# Patient Record
Sex: Female | Born: 1940 | Race: White | Hispanic: No | Smoking: Current every day smoker
Health system: Southern US, Community
[De-identification: ages and names within clinical notes are randomized; demographics above are authoritative.]

## PROBLEM LIST (undated history)

## (undated) DIAGNOSIS — J449 Chronic obstructive pulmonary disease, unspecified: Secondary | ICD-10-CM

## (undated) DIAGNOSIS — F418 Other specified anxiety disorders: Secondary | ICD-10-CM

## (undated) DIAGNOSIS — I10 Essential (primary) hypertension: Secondary | ICD-10-CM

## (undated) DIAGNOSIS — E039 Hypothyroidism, unspecified: Secondary | ICD-10-CM

## (undated) HISTORY — DX: Other specified anxiety disorders: F41.8

## (undated) HISTORY — PX: EYE SURGERY: SHX253

## (undated) HISTORY — PX: ABDOMINAL HYSTERECTOMY: SHX81

## (undated) HISTORY — PX: CHOLECYSTECTOMY: SHX55

## (undated) HISTORY — PX: TUMOR REMOVAL: SHX12

---

## 1998-08-10 ENCOUNTER — Encounter: Payer: Self-pay | Admitting: Neurosurgery

## 1998-08-10 ENCOUNTER — Ambulatory Visit (HOSPITAL_COMMUNITY): Admission: RE | Admit: 1998-08-10 | Discharge: 1998-08-10 | Payer: Self-pay | Admitting: Neurosurgery

## 1998-08-27 ENCOUNTER — Ambulatory Visit (HOSPITAL_COMMUNITY): Admission: RE | Admit: 1998-08-27 | Discharge: 1998-08-27 | Payer: Self-pay | Admitting: Neurosurgery

## 1998-09-22 ENCOUNTER — Encounter: Payer: Self-pay | Admitting: Neurosurgery

## 1998-09-24 ENCOUNTER — Ambulatory Visit (HOSPITAL_COMMUNITY): Admission: RE | Admit: 1998-09-24 | Discharge: 1998-09-24 | Payer: Self-pay | Admitting: Neurosurgery

## 2003-11-18 ENCOUNTER — Encounter: Admission: RE | Admit: 2003-11-18 | Discharge: 2003-11-18 | Payer: Self-pay | Admitting: Otolaryngology

## 2003-11-19 ENCOUNTER — Encounter (INDEPENDENT_AMBULATORY_CARE_PROVIDER_SITE_OTHER): Payer: Self-pay | Admitting: Specialist

## 2003-11-19 ENCOUNTER — Ambulatory Visit (HOSPITAL_BASED_OUTPATIENT_CLINIC_OR_DEPARTMENT_OTHER): Admission: RE | Admit: 2003-11-19 | Discharge: 2003-11-19 | Payer: Self-pay | Admitting: Otolaryngology

## 2003-11-19 ENCOUNTER — Ambulatory Visit (HOSPITAL_COMMUNITY): Admission: RE | Admit: 2003-11-19 | Discharge: 2003-11-19 | Payer: Self-pay | Admitting: Otolaryngology

## 2005-03-23 ENCOUNTER — Ambulatory Visit: Payer: Self-pay | Admitting: Cardiology

## 2006-03-02 ENCOUNTER — Ambulatory Visit: Payer: Self-pay | Admitting: Family Medicine

## 2006-03-26 ENCOUNTER — Ambulatory Visit: Payer: Self-pay | Admitting: Family Medicine

## 2006-05-07 ENCOUNTER — Ambulatory Visit: Payer: Self-pay | Admitting: Family Medicine

## 2006-07-05 ENCOUNTER — Ambulatory Visit: Payer: Self-pay | Admitting: Family Medicine

## 2010-06-06 ENCOUNTER — Encounter (HOSPITAL_COMMUNITY)
Admission: RE | Admit: 2010-06-06 | Discharge: 2010-06-06 | Disposition: A | Discharge status: Home / Self Care | Payer: Medicare Other | Source: Ambulatory Visit | Attending: Obstetrics and Gynecology | Admitting: Obstetrics and Gynecology

## 2010-06-06 LAB — BASIC METABOLIC PANEL
BUN: 18 mg/dL (ref 6–23)
CO2: 25 mEq/L (ref 19–32)
Calcium: 9.4 mg/dL (ref 8.4–10.5)
Chloride: 101 mEq/L (ref 96–112)
Creatinine, Ser: 0.82 mg/dL (ref 0.4–1.2)
GFR calc Af Amer: >60 mL/min (ref >60)
GFR calc non Af Amer: >60 mL/min (ref >60)
Glucose, Bld: 90 mg/dL (ref 70–99)
Potassium: 3.6 mEq/L (ref 3.5–5.1)
Sodium: 134 mEq/L — ABNORMAL LOW (ref 135–145)

## 2010-06-06 LAB — CBC
HCT: 43.8 % (ref 36.0–46.0)
Hemoglobin: 15.1 g/dL — ABNORMAL HIGH (ref 12.0–15.0)
MCH: 32.5 pg (ref 26.0–34.0)
MCHC: 34.5 g/dL (ref 30.0–36.0)
MCV: 94.4 fL (ref 78.0–100.0)
Platelets: 265 10*3/uL (ref 150–400)
RBC: 4.64 MIL/uL (ref 3.87–5.11)
RDW: 13.3 % (ref 11.5–15.5)
WBC: 11.9 10*3/uL — ABNORMAL HIGH (ref 4.0–10.5)

## 2010-06-06 LAB — SURGICAL PCR SCREEN
MRSA, PCR: NEGATIVE
Staphylococcus aureus: NEGATIVE

## 2010-06-13 ENCOUNTER — Ambulatory Visit (HOSPITAL_COMMUNITY)
Admission: RE | Admit: 2010-06-13 | Discharge: 2010-06-15 | Disposition: A | Discharge status: Home / Self Care | Payer: Medicare Other | Source: Ambulatory Visit | Attending: Obstetrics and Gynecology | Admitting: Obstetrics and Gynecology

## 2010-06-13 DIAGNOSIS — N393 Stress incontinence (female) (male): Secondary | ICD-10-CM | POA: Insufficient documentation

## 2010-06-13 DIAGNOSIS — N993 Prolapse of vaginal vault after hysterectomy: Secondary | ICD-10-CM | POA: Insufficient documentation

## 2010-06-14 LAB — CBC
Hemoglobin: 11.5 g/dL — ABNORMAL LOW (ref 12.0–15.0)
MCH: 31.3 pg (ref 26.0–34.0)
Platelets: 211 10*3/uL (ref 150–400)
RBC: 3.68 MIL/uL — ABNORMAL LOW (ref 3.87–5.11)
WBC: 16.6 10*3/uL — ABNORMAL HIGH (ref 4.0–10.5)

## 2010-07-06 NOTE — Discharge Summary (Signed)
  NAMEANGLIA, Maria Blackwell              ACCOUNT NO.:  [id number]  MEDICAL RECORD NO.:  [id number]           PATIENT TYPE:  O  LOCATION:  9316                          FACILITY:  [facility]  PHYSICIAN:  Juluis Mire, M.D.   DATE OF BIRTH:  August 29, 1940  DATE OF ADMISSION:  06/13/2010 DATE OF DISCHARGE:  06/15/2010                              DISCHARGE SUMMARY   ADMITTING DIAGNOSIS:  Pelvic relaxation with associated stress incontinence.  DISCHARGE DIAGNOSIS:  Pelvic relaxation with associated stress incontinence.  OPERATIVE PROCEDURE:  Posterior repair with sacrospinous ligament suspension.  Mid urethral sling and cystoscopy.  For complete history and physical please see dictated note.  COURSE IN THE HOSPITAL:  The patient underwent the above-noted surgery. Postop, did well.  The following morning, her Foley was discontinued. She was able to void.  Residuals gradually decreased and the last one was less than 100 mL.  We kept her overnight for the second night to make sure she was voiding without difficulty plus she had a little bit of nausea which did resolve by the following morning.  She did complain of some right buttocks pain is only with movement.  It was stable at the present time.  At the time of discharge, she was afebrile, stable vital signs.  Abdomen was soft.  Bowel sounds were active.  She was passing flatus, had no active vaginal bleeding, was voiding well.  Her postop hemoglobin was 11.5.  In terms of complications, none were encountered during her stay in the hospital.  Discharged home in stable condition.  DISPOSITION:  The patient to avoid heavy lifting, vaginal entrance, driving a car.  She is to call with active bleeding, increasing pain, nausea, vomiting or fever.  She is also instructed of signs and symptoms of deep venous thrombosis and pulmonary embolus.  She will come to the office tomorrow for me to measure one more postvoid residual.     Juluis Mire,  M.D.     JSM/MEDQ  D:  06/15/2010  T:  06/15/2010  Job:  [id number]  Electronically Signed by Richardean Chimera M.D. on 07/06/2010 05:38:48 AM

## 2010-07-06 NOTE — H&P (Signed)
NAMEEYVETTE, CORDON              ACCOUNT NO.:  [id number]  MEDICAL RECORD NO.:  [id number]           PATIENT TYPE:  O  LOCATION:  9316                          FACILITY:  [facility]  PHYSICIAN:  Juluis Mire, M.D.   DATE OF BIRTH:  August 31, 1940  DATE OF ADMISSION:  06/13/2010 DATE OF DISCHARGE:                             HISTORY & PHYSICAL   The patient is a 70 year old, gravida 3, para 3, postmenopausal patient who presents for anterior and posterior repair, mid urethral sling, and sacrospinous ligament suspension.  In relation to present admission, the patient has had a previous total abdominal hysterectomy with bilateral salpingo-oophorectomy.  She has trouble with worsening pelvic relaxation with associated stress incontinence.  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  She had fairly normal leak point pressures.  Also had a normal urethral pressure profile.  She has trouble with worsening pelvic relaxation.  She does have a significant rectocele.  Anteriorly, she has a minimal cystourethrocele.  Cuff is intact.  She now presents for anterior and posterior repair, mid urethral sling, and sacrospinous ligament suspension.  In terms of allergies, she is allergic to AMOXICILLIN.  MEDICATIONS AT THE PRESENT TIME: 1. Synthroid 100 mcg a day. 2. Baby aspirin daily. 3. Gabapentin 300 mg daily at bedtime. 4. Vicodin as needed. 5. Maxzide 1 tablet daily. 6. Estropipate 0.5 mg daily. 7. Amlodipine/benazepril 5/10 mg daily. 8. Paroxetine 20 mg daily. 9. Meloxicam 15 mg daily.  PAST MEDICAL HISTORY:  She does have a history of hypertension, on active management.  History of chronic back pain.  History of gastroesophageal reflux disorders.  PAST SURGICAL HISTORY:  She has had a previous total abdominal hysterectomy with bilateral salpingo-oophorectomy.  She has had a hemorrhoidectomy and cholecystectomy.  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  SOCIAL HISTORY:  She has daily tobacco use of 1 pack per day.  No alcohol use.  FAMILY HISTORY:  Noncontributory.  REVIEW OF SYSTEMS:  Noncontributory.  PHYSICAL EXAMINATION:  VITAL SIGNS:  The patient is afebrile with stable vital signs. HEENT:  The patient is normocephalic.  Pupils are equal, round, and reactive to light and accommodation.  Extraocular movements are intact. Sclerae and conjunctivae are clear.  Oropharynx is clear. NECK:  Without thyromegaly. BREASTS:  Not examined. LUNGS:  Clear. CARDIOVASCULAR:  Regular rhythm and rate without murmurs or gallops. ABDOMEN:  Benign.  No mass, organomegaly, or tenderness. PELVIC:  Normal external genitalia.  Vaginal mucosa is clear.  She does have a prominent rectocele, mild to moderate cystocele with urethrocele. Cuff is intact.  Bimanual exam is unremarkable.  No mass appreciate. EXTREMITIES:  Trace edema. NEUROLOGIC:  Grossly within normal limits.  IMPRESSION: 1. Symptomatic pelvic relaxation with associated stress incontinence. 2. Hypertension. 3. Gastroesophageal reflux disorder. 4. Tobacco use.  PLAN:  The patient will undergo the above-noted surgery.  The risks of surgery have been discussed including the risks of infection.  Risk of hemorrhage that could require transfusion with the risk of AIDS or hepatitis.  Risk of injury to adjacent organs including bladder, bowel, and  ureters that could require further exploratory surgery.  Risk of deep venous thrombosis and pulmonary embolus.  With bladder suspension, there is a risk of over tightening that could lead to inability to void requiring loosening or taking down the sling with the possible return of incontinence.  There is a risk of developing bladder spasms that could require medical management.  Also the risk of obturator nerve injury that could lead to chronic leg pain and weakness.  Pudendal nerve injury can lead to  numbness and pain in the buttocks and perineal area.  This can require removing these for management.  In terms using mesh, there is risk of mesh erosion.  Also risk of mesh rejection leading to chronic pelvic pain requiring to be taken out of the mesh.     Juluis Mire, M.D.     JSM/MEDQ  D:  06/13/2010  T:  06/13/2010  Job:  [id number]  Electronically Signed by Richardean Chimera M.D. on 07/06/2010 05:38:53 AM

## 2010-07-06 NOTE — Op Note (Signed)
Maria Blackwell, Maria Blackwell              ACCOUNT NO.:  [id number]  MEDICAL RECORD NO.:  [id number]           PATIENT TYPE:  O  LOCATION:  9316                          FACILITY[facility]  [facility]  PHYSICIAN:  Juluis Mire, M.D.   DATE OF BIRTH:  01/07/1941  DATE OF PROCEDURE:  06/13/2010 DATE OF DISCHARGE:                              OPERATIVE REPORT   PREOPERATIVE DIAGNOSES:  Symptomatic pelvic relaxation with associated stress urinary incontinence.  POSTOPERATIVE DIAGNOSES:  Symptomatic pelvic relaxation with associated stress urinary incontinence.  OPERATIVE PROCEDURES:  Posterior repair with sacrospinous ligament suspension.  Mid urethral sling using a transobturator approach. Cystoscopy.  SURGEON:  Juluis Mire, MD  ANESTHESIA:  General endotracheal.  ESTIMATED BLOOD LOSS:  Approximately 200 mL.  PACKS:  Vaginal pack.  DRAINS:  Urethral Foley.  INTRAOPERATIVE BLOOD PLACED:  None.  COMPLICATIONS:  None.  INDICATIONS:  Dictated in the history and physical.  PROCEDURE IN DETAILS:  The patient was taken to OR and placed in a supine position.  After satisfactory level of general endotracheal anesthesia was obtained, the perineum and vagina were prepped out with Betadine.  Bladder was emptied with in-and-out catheterization.  The patient was then draped in sterile field.  Visualization revealed a very prominent rectocele.  Her anterior support actually looked good particularly when we gave her a better apical support using Kelly's.  We therefore felt that the cystocele was minimal, so began with a posterior repair.  The vagina and perineum were infiltrated with 1% Xylocaine with epinephrine.  The incision was made in the perineal body and the skin was dissected up to the vaginal opening.  The skin was then excised.  We underlined the vaginal mucosa in the midline and incised it.  We then dissected out laterally on both sides until we could get to the uterosacral ligaments on each  side.  At this point in time using the Capio, two permanent sutures were placed in the uterosacral ligament on each side and held.  We then reapproximated the perirectal fascia in the midline using 2-0 Vicryl.  This completely reduced the rectocele.  We then secured the sacrospinous ligament stitches to the vaginal apex. These were again held.  We trimmed the vaginal mucosa edges at this point in time and reapproximated the vaginal mucosa with interrupted sutures of 2-0 Vicryl to approximately halfway to the vaginal opening. Next, the uterosacral stitches were tied down.  We had good apical support at this point in time.  The remaining vaginal mucosa was closed with 2-0 Vicryl.  Perineal body was rebuilt with 2-0 Vicryl and the skin on the perineum was closed with a running subcuticular 2-0 Vicryl.  We had good approximation, good hemostasis.  Rectal exam was unremarkable. There was no evidence of injury to the colon.  The sacrospinous ligament suspension gave [place] good support but did not constrict the colon.  At this point in time, we revisited the cystocele.  It was extremely minimal.  We decided to not to do any repair to that area at this point.  We now proceed with the mid urethral sling.  The vaginal mucosa overlying  the urethra was injected with 1% Xylocaine with epinephrine. We injected out laterally to the obturator foramen on each side.  The Foley was put in place and held.  Incision of vagina was made under the mid urethral area.  We then sharply and bluntly dissected out to the obturator foramen on each side.  We identified an area on the groin at the level of the clitoris lateral to the inferior pubic ramus and below the obturator longus tendon.  This area was infiltrated with 1% Xylocaine with epinephrine and incision was made with a knife.  The obturator system was brought in place.  The needles were passed through the skin through the obturator foramen and around the  inferior pubic ramus and out to the vaginal incision on each side.  There was no evidence that we buttonholed the vaginal mucosa.  Next, the Foley was removed.  Cystoscopy was performed.  There was no injury to the bladder or urethra itself.  Both ureteral orifices were identified and we noted streams of clear urine.  The polypropylene mesh was put in place, hook to the needle in each side and brought out to the skin.  We clipped the blue tab.  We adjusted the mesh under the mid urethral until lay flat but we could rotate a Kelly 90 degrees.  The plastic sleeves were then removed.  The mesh was trimmed at the level of skin on the groin.  The vaginal incision was closed with running locking suture of 2-0 Vicryl. Skin was closed with Dermabond.  Foley was placed to baggage drainage. A vaginal pack was put in place.  The patient taken out of the dorsal lithotomy position.  Once alert and extubated, transferred to the recovery room in good condition.  Sponge, instrument, and needle count reported as correct by circulating nurse x2.     Juluis Mire, M.D.     JSM/MEDQ  D:  06/13/2010  T:  06/14/2010  Job:  [id number]  Electronically Signed by Richardean Chimera M.D. on 07/06/2010 05:38:55 AM

## 2010-07-29 NOTE — Op Note (Signed)
NAMEZARIEL, CAPANO                          ACCOUNT NO.:  [id number]   MEDICAL RECORD NO.:  [id number]                   PATIENT TYPE:  AMB   LOCATION:  DSC                                  FACILITY:  [facility]   PHYSICIAN:  Suzanna Obey, M.D.                    DATE OF BIRTH:  Feb 15, 1941   DATE OF PROCEDURE:  11/19/2003  DATE OF DISCHARGE:                                 OPERATIVE REPORT   PREOPERATIVE DIAGNOSIS:  Right parotid mass.   POSTOPERATIVE DIAGNOSIS:  Right parotid mass.   PROCEDURE:  Total parotidectomy with facial nerve monitoring.   ANESTHESIA:  General endotracheal tube.   ESTIMATED BLOOD LOSS:  Less than 10 mL.   INDICATIONS FOR PROCEDURE:  This 70 year old who has had a mass in her right  parotid that has been slowly enlarging.  She wants to have the lesion  removed and it does feel like it potentially could be a deep lobe.  She was  informed of the risks and benefits of the procedure including bleeding,  infection, salivary fistula, Frye syndrome, facial nerve paralysis, numbness  of the wound and ear, and risks of the anesthetic.  All questions were  answered and consent was obtained.   DESCRIPTION OF PROCEDURE:  The patient was taken to the operating room and  placed in the supine position.  After adequate general endotracheal tube  anesthesia, she was placed in the left gaze position.  Prepped and draped in  the usual sterile fashion.  The facial nerve monitor was positioned and  calibrated.  An incision was made in the preauricular extending down into  the neck and a flap was elevated anterior and posterior.  The dissection was  carried down along the cartilage down to the tympanomeatal suture line,  where carefully the nerve was dissected with the hemostat and McCabe  dissector until the nerve was identified.  The dissection was then carried  out and the nerve was carefully dissected bringing the superficial lobe off  of all the branches and all branches were  preserved.  The tumor was  obviously deep and it was between the upper and lower branch.  It was then  dissected mobilizing the inferior and superior branch around it and  dissecting down into the deep lobe.  The tumor was brought out and sent for  pathology.  This wound was then copiously irrigated.  The facial nerve all  appeared to be completely intact and  was completely dissected.  The wound was then closed with interrupted 4-0  chromic.  A #7 JP drain was placed.  This was secured with a 3-0 nylon.  The  skin was closed with running 6-0 nylon.  The patient was awakened and  brought to the recovery room in stable condition.  Needle, sponge, and  instrument counts correct.  Suzanna Obey, M.D.    Cordelia Pen  D:  11/19/2003  T:  11/19/2003  Job:  [id number]   cc:   Dione Booze  [address]  [place]  [place] [address]  Fax: [phone]

## 2012-01-02 ENCOUNTER — Encounter (HOSPITAL_COMMUNITY): Payer: Self-pay | Admitting: Physical Medicine and Rehabilitation

## 2012-01-02 ENCOUNTER — Emergency Department (HOSPITAL_COMMUNITY): Payer: Medicare Other

## 2012-01-02 ENCOUNTER — Inpatient Hospital Stay (HOSPITAL_COMMUNITY)
Admission: EM | Admit: 2012-01-02 | Discharge: 2012-01-04 | DRG: 066 | Disposition: A | Discharge status: Home Health | Payer: Medicare Other | Attending: Internal Medicine | Admitting: Internal Medicine

## 2012-01-02 DIAGNOSIS — Z79899 Other long term (current) drug therapy: Secondary | ICD-10-CM

## 2012-01-02 DIAGNOSIS — H532 Diplopia: Secondary | ICD-10-CM | POA: Diagnosis present

## 2012-01-02 DIAGNOSIS — I639 Cerebral infarction, unspecified: Secondary | ICD-10-CM | POA: Diagnosis present

## 2012-01-02 DIAGNOSIS — R51 Headache: Secondary | ICD-10-CM | POA: Diagnosis present

## 2012-01-02 DIAGNOSIS — I6350 Cerebral infarction due to unspecified occlusion or stenosis of unspecified cerebral artery: Principal | ICD-10-CM | POA: Diagnosis present

## 2012-01-02 DIAGNOSIS — D72829 Elevated white blood cell count, unspecified: Secondary | ICD-10-CM | POA: Diagnosis present

## 2012-01-02 DIAGNOSIS — E663 Overweight: Secondary | ICD-10-CM | POA: Diagnosis present

## 2012-01-02 DIAGNOSIS — I6789 Other cerebrovascular disease: Secondary | ICD-10-CM | POA: Diagnosis present

## 2012-01-02 DIAGNOSIS — H539 Unspecified visual disturbance: Secondary | ICD-10-CM | POA: Diagnosis present

## 2012-01-02 DIAGNOSIS — R519 Headache, unspecified: Secondary | ICD-10-CM | POA: Diagnosis present

## 2012-01-02 DIAGNOSIS — E039 Hypothyroidism, unspecified: Secondary | ICD-10-CM | POA: Diagnosis present

## 2012-01-02 DIAGNOSIS — I10 Essential (primary) hypertension: Secondary | ICD-10-CM | POA: Diagnosis present

## 2012-01-02 HISTORY — DX: Hypothyroidism, unspecified: E03.9

## 2012-01-02 HISTORY — DX: Essential (primary) hypertension: I10

## 2012-01-02 LAB — CBC WITH DIFFERENTIAL/PLATELET
Eosinophils Relative: 1 % (ref 0–5)
Hemoglobin: 14.8 g/dL (ref 12.0–15.0)
Lymphocytes Relative: 26 % (ref 12–46)
Lymphs Abs: 2.9 10*3/uL (ref 0.7–4.0)
MCV: 91.6 fL (ref 78.0–100.0)
Monocytes Relative: 7 % (ref 3–12)
Neutrophils Relative %: 65 % (ref 43–77)
Platelets: 249 10*3/uL (ref 150–400)
RBC: 4.63 MIL/uL (ref 3.87–5.11)
WBC: 11.1 10*3/uL — ABNORMAL HIGH (ref 4.0–10.5)

## 2012-01-02 LAB — BASIC METABOLIC PANEL
CO2: 25 mEq/L (ref 19–32)
Calcium: 9.8 mg/dL (ref 8.4–10.5)
Glucose, Bld: 83 mg/dL (ref 70–99)
Potassium: 3.6 mEq/L (ref 3.5–5.1)
Sodium: 134 mEq/L — ABNORMAL LOW (ref 135–145)

## 2012-01-02 MED ORDER — MECLIZINE HCL 25 MG PO TABS
25.0000 mg | ORAL_TABLET | Freq: Once | ORAL | Status: AC
  Administered 2012-01-02: 25 mg via ORAL
  Filled 2012-01-02: qty 1

## 2012-01-02 MED ORDER — SODIUM CHLORIDE 0.9 % IV BOLUS (SEPSIS)
1000.0000 mL | Freq: Once | INTRAVENOUS | Status: AC
  Administered 2012-01-02: 1000 mL via INTRAVENOUS

## 2012-01-02 MED ORDER — ASPIRIN EC 325 MG PO TBEC
325.0000 mg | DELAYED_RELEASE_TABLET | Freq: Every day | ORAL | Status: DC
  Administered 2012-01-02: 325 mg via ORAL
  Filled 2012-01-02 (×2): qty 1

## 2012-01-02 NOTE — ED Notes (Signed)
Family frustrated with wait times. Delay of care explained to patient and family. Pt is resting quietly at the time. Vital signs stable. No signs of distress noted. No change in neurological status.

## 2012-01-02 NOTE — ED Notes (Signed)
Pt presents to department for evaluation of headache, dizziness and blurred vision. Onset this morning @7 :00am after waking up. 3/10 headache at the time. Able to move all extremities. Strong equal bilateral grip strengths. Pupils equal and reactive. Speech clear. She is conscious alert and oriented x4.

## 2012-01-02 NOTE — ED Provider Notes (Signed)
History     CSN: [id number]  Arrival date & time 01/02/12  1314   First MD Initiated Contact with Patient 01/02/12 1521      Chief Complaint  Patient presents with  . Dizziness  . Headache  . Blurred Vision    (Consider location/radiation/quality/duration/timing/severity/associated sxs/prior treatment) Patient is a 71 y.o. female presenting with headaches. The history is provided by the patient and a relative.  Headache  This is a new problem. The current episode started 6 to 12 hours ago. The problem occurs constantly. The problem has not changed since onset.The headache is associated with an unknown factor. The pain is located in the right unilateral region. Quality: burning. The pain is at a severity of 10/10. The pain is severe. The pain radiates to the face. Associated symptoms include nausea. Pertinent negatives include no anorexia, no fever, no malaise/fatigue, no chest pressure, no near-syncope, no orthopnea, no palpitations, no syncope, no shortness of breath and no vomiting. She has tried nothing for the symptoms.    Past Medical History  Diagnosis Date  . Hypertension   . Hypothyroid     No past surgical history on file.  No family history on file.  History  Substance Use Topics  . Smoking status: Never Smoker   . Smokeless tobacco: Not on file  . Alcohol Use: No    OB History    Grav Para Term Preterm Abortions TAB SAB Ect Mult Living                  Review of Systems  Constitutional: Negative for fever and malaise/fatigue.  HENT: Negative for neck pain and neck stiffness.   Eyes: Positive for visual disturbance (blurry, seeing double (side by side)). Negative for photophobia.  Respiratory: Negative for cough and shortness of breath.   Cardiovascular: Negative for chest pain, palpitations, orthopnea, syncope and near-syncope.  Gastrointestinal: Positive for nausea. Negative for vomiting, abdominal pain, diarrhea and anorexia.  Neurological: Positive  for dizziness (vertigo) and headaches. Negative for tremors, seizures, syncope, facial asymmetry, speech difficulty, weakness and numbness.  All other systems reviewed and are negative.    Allergies  Amoxicillin  Home Medications   Current Outpatient Rx  Name Route Sig Dispense Refill  . AMLODIPINE BESYLATE 5 MG PO TABS Oral Take 5 mg by mouth daily.    Marland Kitchen CALCIUM CARBONATE 600 MG PO TABS Oral Take 600 mg by mouth 2 (two) times daily with a meal.    . HYDROCODONE-ACETAMINOPHEN 5-500 MG PO TABS Oral Take 1 tablet by mouth every 6 (six) hours as needed. For pain    . LEVOTHYROXINE SODIUM 100 MCG PO TABS Oral Take 100 mcg by mouth daily.    . MELOXICAM 15 MG PO TABS Oral Take 15 mg by mouth daily.    Marland Kitchen PAROXETINE HCL 20 MG PO TABS Oral Take 20 mg by mouth every morning.    . TRIAMTERENE-HCTZ 37.5-25 MG PO CAPS Oral Take 1 capsule by mouth daily.      BP 122/71  Pulse 64  Temp 98.1 F (36.7 C) (Oral)  Resp 16  SpO2 94%  Physical Exam  Nursing note and vitals reviewed. Constitutional: She is oriented to person, place, and time. She appears well-developed and well-nourished. No distress.  HENT:  Head: Normocephalic and atraumatic.  Eyes: Pupils are equal, round, and reactive to light. Right eye exhibits nystagmus. Left eye exhibits nystagmus.       Nystagmus appears both vertical and horizontal  Neck: Normal  range of motion.  Cardiovascular: Normal rate and normal heart sounds.   Pulmonary/Chest: Effort normal and breath sounds normal. No respiratory distress.  Abdominal: Soft. She exhibits no distension. There is no tenderness.  Musculoskeletal: Normal range of motion.  Neurological: She is alert and oriented to person, place, and time. No cranial nerve deficit or sensory deficit. She exhibits normal muscle tone. Coordination and gait abnormal. GCS eye subscore is 4. GCS verbal subscore is 5. GCS motor subscore is 6.  Skin: Skin is warm and dry.    ED Course  Procedures  (including critical care time)  Labs Reviewed  BASIC METABOLIC PANEL - Abnormal; Notable for the following:    Sodium 134 (*)     GFR calc non Af Amer 85 (*)     All other components within normal limits  CBC WITH DIFFERENTIAL - Abnormal; Notable for the following:    WBC 11.1 (*)     All other components within normal limits   Ct Head Wo Contrast  01/02/2012  *RADIOLOGY REPORT*  Clinical Data: Dizziness, headache, blurred vision  CT HEAD WITHOUT CONTRAST  Technique:  Contiguous axial images were obtained from the base of the skull through the vertex without contrast.  Comparison: 10/03/2010  Findings: No skull fracture is noted.  Paranasal sinuses and mastoid air cells are unremarkable.  No intracranial hemorrhage, mass effect or midline shift.  No hydrocephalus.  The gray and white matter differentiation is preserved.  No intra or extra-axial fluid collection.  No acute infarction.  No mass lesion is noted on this unenhanced scan.  IMPRESSION: No acute intracranial abnormality.  No significant change.   Original Report Authenticated By: Natasha Mead, M.D.    Mr Brain Wo Contrast  01/02/2012  *RADIOLOGY REPORT*  Clinical Data: Dizziness, headaches, blurred vision  MRI HEAD WITHOUT CONTRAST  Technique:  Multiplanar, multiecho pulse sequences of the brain and surrounding structures were obtained according to standard protocol without intravenous contrast.  Comparison: CT head 05/05/2011  Findings: Small area of acute infarction in the right paramedian pons.  No other acute infarct.  Mild chronic microvascular ischemia in the white matter.  Negative for mass lesion.  The small subdural fluid collections bilaterally.  This fluid appears to be primarily CSF however there may be some mild associated subdural blood bilaterally.  Alternatively, this could represent leptomeningeal thickening from infection or tumor spread. Postcontrast imaging of the brain is suggested to evaluate for meningeal enhancement.   No midline shift.  IMPRESSION: Small area of acute infarction in the right pons.  Small bilateral subdural fluid collections.  This may be associated with a small amount of subdural hemorrhage or possibly leptomeningeal thickening from tumor or infection.  Postcontrast imaging of the brain is suggested for further evaluation.   Original Report Authenticated By: Camelia Phenes, M.D.      1. CVA (cerebral vascular accident)       MDM  3:21 PM Pt seen and examined. Pt awoke this morning with right side of head burning headache associated with vertigo. Pt with extensive nystagmus on exam. Concern for posterior stroke. Will start with CT scan, but patient will likely need an MRI.  5:15 PM CT and labs unremarkable. Will get MRI. Pt will go to [facility] for remainder of workup.        Daleen Bo, MD 01/03/12 [phone]

## 2012-01-02 NOTE — Consult Note (Signed)
Reason for Consult: Pontine stroke Referring Physician: Jeraldine Loots, R  CC: Double vision  History is obtained from: Patient, 2 companions in room  HPI: Maria Blackwell is an 70 y.o. female a history of hypertension who woke this morning with a headache, double vision, and vertigo. She came into the emergency room and had an MRI which shows a new pontine infarct. Her symptoms have been present since they started, and the double vision is the problem concerning her the most. He has had some vertigo, which is mild, has not had any vomiting.   She did not taken aspirin prior to admission.  ROS: An 11 point ROS was performed and is negative except as noted in the HPI.  Past Medical History  Diagnosis Date  . Hypertension   . Hypothyroid     Family History: Brother-stroke  Social History: Tob: Smokes about a pack a day  Exam: Current vital signs: BP 165/90  Pulse 56  Temp 98.6 F (37 C) (Oral)  Resp 20  SpO2 97% Vital signs in last 24 hours: Temp:  [98.1 F (36.7 C)-98.6 F (37 C)] 98.6 F (37 C) (10/22 1610) Pulse Rate:  [56-76] 56  (10/22 1926) Resp:  [10-20] 20  (10/22 1926) BP: (122-165)/(70-90) 165/90 mmHg (10/22 1926) SpO2:  [94 %-99 %] 97 % (10/22 1926)  General: In bed, appears anxious CV: Regular rate and rhythm Mental Status: Patient is awake, alert, oriented to person, place, month, year, and situation. Immediate and remote memory are intact. Patient is able to give a clear and coherent history. No evidence of aphasia Cranial Nerves: II: Visual Fields are full. Pupils are equal, round, and reactive to light.  Discs are difficult to visualize. III,IV, VI: Mild right ptosis, right eye is externally deviated compared to left. Left eye has full movements. V,VII: Facial sensation is decreased on right and facial movement is symmetric.  VIII: hearing is intact to voice X: Uvula elevates symmetrically XI: Shoulder shrug is symmetric. XII: tongue is midline  without atrophy or fasciculations.  Motor: Tone is normal. Bulk is normal. 5/5 strength was present in all four extremities, with the possible exception of left triceps which would be 4+/5 she does not have any drift Sensory: Sensation is symmetric to light touch and temperature in the arms and legs. Deep Tendon Reflexes: 2+ and symmetric in the biceps and patellae.  Plantars: Toes are downgoing bilaterally.  Cerebellar: FNF with mild endpoint tremor on right, intact on left Gait: Did not assess due to desire to keep patient flat during period of acute stroke.  I have reviewed labs in epic and the results pertinent to this consultation are: Mild leukocytosis, BMP unremarkable  I have reviewed the images obtained: MRI brain-new pontine infarct. She has mild thickening of the meninges of uncertain significance.  Impression: 71 year old female with new pontine stroke. Her mildly thickened meninges are of uncertain significance, but she will need an MR angiogram of the head and we can do postcontrast images at that time. I suspect that this is due to small vessel disease due to her smoking and hypertension, but she will need a stroke workup.  Recommendations: 1. HgbA1c, fasting lipid panel 2. MRI, MRA  of the brain without contrast 3. PT consult, OT consult, Speech consult 4. Echocardiogram 5. Carotid dopplers 6. Prophylactic therapy-Antiplatelet med: Aspirin - dose 325mg  7. Risk factor modification 8. Telemetry monitoring 9. Frequent neuro checks 10. Permissive htn    Ritta Slot, MD [facility] [phone]  If  7pm- 7am, please page neurology on call at [phone].

## 2012-01-02 NOTE — ED Notes (Signed)
Assisted patient on and off the bed pan.

## 2012-01-02 NOTE — ED Notes (Signed)
Pt up to bathroom, became dizzy while walking. States "I feel like I am drunk when I walk."

## 2012-01-02 NOTE — ED Notes (Signed)
Pt presents to department via [facility] from home for evaluation of headache, dizziness and blurred vision. Onset this morning @7 :00am. No neurological deficits upon arrival. Able to move all extremities. She is conscious alert and oriented x4. 18 LAC.

## 2012-01-02 NOTE — ED Notes (Signed)
Pt transported to CT scan.

## 2012-01-02 NOTE — ED Notes (Signed)
Pt returned to exam room from CT scan. States she doesn't feel as dizzy, states blurred vision continues. Vital signs stable. Family remains at bedside.

## 2012-01-02 NOTE — ED Notes (Signed)
Report received from Megan, RN. Assumed care of patient at this time.

## 2012-01-02 NOTE — ED Notes (Signed)
Pt undressed, in gown, on monitor, continuous pulse oximetry and blood pressure cuff 

## 2012-01-02 NOTE — ED Notes (Signed)
Assisted pt to the bathroom and back to her stretcher

## 2012-01-02 NOTE — ED Notes (Signed)
Patient transported to MRI 

## 2012-01-02 NOTE — ED Notes (Signed)
Patient transported from MRI 

## 2012-01-02 NOTE — ED Notes (Signed)
Report given to CDU. Pt transported to unit.

## 2012-01-02 NOTE — ED Provider Notes (Signed)
MRI results reviewed and discussed with Dr. Jeraldine Loots:  Small area of acute infarction in the right paramedian  pons. No other acute infarct.  Neuro-hospitalist contacted--will see in ED.  12:48 AM patient admitted to unassigned medicine ([facility], tele)  Jimmye Norman, NP 01/03/12 [phone]

## 2012-01-03 ENCOUNTER — Inpatient Hospital Stay (HOSPITAL_COMMUNITY): Payer: Medicare Other

## 2012-01-03 ENCOUNTER — Encounter (HOSPITAL_COMMUNITY): Payer: Self-pay | Admitting: *Deleted

## 2012-01-03 DIAGNOSIS — H539 Unspecified visual disturbance: Secondary | ICD-10-CM | POA: Diagnosis present

## 2012-01-03 DIAGNOSIS — R519 Headache, unspecified: Secondary | ICD-10-CM | POA: Diagnosis present

## 2012-01-03 DIAGNOSIS — I10 Essential (primary) hypertension: Secondary | ICD-10-CM | POA: Diagnosis present

## 2012-01-03 DIAGNOSIS — I517 Cardiomegaly: Secondary | ICD-10-CM

## 2012-01-03 DIAGNOSIS — R51 Headache: Secondary | ICD-10-CM

## 2012-01-03 DIAGNOSIS — I639 Cerebral infarction, unspecified: Secondary | ICD-10-CM | POA: Diagnosis present

## 2012-01-03 DIAGNOSIS — I6350 Cerebral infarction due to unspecified occlusion or stenosis of unspecified cerebral artery: Principal | ICD-10-CM

## 2012-01-03 LAB — LIPID PANEL: LDL Cholesterol: 96 mg/dL (ref 0–99)

## 2012-01-03 MED ORDER — AMLODIPINE BESYLATE 5 MG PO TABS
5.0000 mg | ORAL_TABLET | Freq: Every day | ORAL | Status: DC
  Administered 2012-01-03 – 2012-01-04 (×2): 5 mg via ORAL
  Filled 2012-01-03 (×2): qty 1

## 2012-01-03 MED ORDER — OXYCODONE-ACETAMINOPHEN 5-325 MG PO TABS
1.0000 | ORAL_TABLET | ORAL | Status: DC | PRN
  Administered 2012-01-03 (×2): 1 via ORAL
  Filled 2012-01-03 (×2): qty 1

## 2012-01-03 MED ORDER — IOHEXOL 350 MG/ML SOLN
50.0000 mL | Freq: Once | INTRAVENOUS | Status: AC | PRN
  Administered 2012-01-03: 50 mL via INTRAVENOUS

## 2012-01-03 MED ORDER — ASPIRIN 300 MG RE SUPP
300.0000 mg | Freq: Every day | RECTAL | Status: DC
  Filled 2012-01-03 (×2): qty 1

## 2012-01-03 MED ORDER — SODIUM CHLORIDE 0.9 % IV SOLN: 250.0000 mL | INTRAVENOUS | Status: DC | PRN

## 2012-01-03 MED ORDER — ASPIRIN 325 MG PO TABS
325.0000 mg | ORAL_TABLET | Freq: Every day | ORAL | Status: DC
  Administered 2012-01-03 – 2012-01-04 (×2): 325 mg via ORAL
  Filled 2012-01-03: qty 1

## 2012-01-03 MED ORDER — CALCIUM CARBONATE 1250 (500 CA) MG PO TABS
1.0000 | ORAL_TABLET | Freq: Two times a day (BID) | ORAL | Status: DC
  Administered 2012-01-03 – 2012-01-04 (×3): 500 mg via ORAL
  Filled 2012-01-03 (×4): qty 1

## 2012-01-03 MED ORDER — LEVOTHYROXINE SODIUM 100 MCG PO TABS
100.0000 ug | ORAL_TABLET | Freq: Every day | ORAL | Status: DC
  Administered 2012-01-03 – 2012-01-04 (×2): 100 ug via ORAL
  Filled 2012-01-03 (×2): qty 1

## 2012-01-03 MED ORDER — PAROXETINE HCL 20 MG PO TABS
20.0000 mg | ORAL_TABLET | ORAL | Status: DC
  Administered 2012-01-03 – 2012-01-04 (×2): 20 mg via ORAL
  Filled 2012-01-03 (×3): qty 1

## 2012-01-03 MED ORDER — TRIAMTERENE-HCTZ 37.5-25 MG PO CAPS
1.0000 | ORAL_CAPSULE | Freq: Every day | ORAL | Status: DC
  Administered 2012-01-03 – 2012-01-04 (×2): 1 via ORAL
  Filled 2012-01-03 (×2): qty 1

## 2012-01-03 MED ORDER — SODIUM CHLORIDE 0.9 % IJ SOLN
3.0000 mL | Freq: Two times a day (BID) | INTRAMUSCULAR | Status: DC
  Administered 2012-01-03 – 2012-01-04 (×4): 3 mL via INTRAVENOUS

## 2012-01-03 MED ORDER — CALCIUM CARBONATE 600 MG PO TABS
600.0000 mg | ORAL_TABLET | Freq: Two times a day (BID) | ORAL | Status: DC
  Filled 2012-01-03 (×3): qty 1

## 2012-01-03 MED ORDER — SODIUM CHLORIDE 0.9 % IJ SOLN: 3.0000 mL | INTRAMUSCULAR | Status: DC | PRN

## 2012-01-03 NOTE — H&P (Signed)
PCP:   Samuel Jester, DO   Chief Complaint:  Headache double vision  HPI: 71 yo female comes in with a day of double vision and headache.  Double vision is worse when she looks to the left and her right eye isnt acting right.  No recent illnesses.  No fevers/n/v/numb/tingling anywhere.  No cp/sob.  No weakness anywhere.  No head trauma.  Review of Systems:  O/w neg  Past Medical History: Past Medical History  Diagnosis Date  . Hypertension   . Hypothyroid     Medications: Prior to Admission medications   Medication Sig Start Date End Date Taking? Authorizing Provider  amLODipine (NORVASC) 5 MG tablet Take 5 mg by mouth daily.   Yes Historical Provider, MD  calcium carbonate (OS-CAL) 600 MG TABS Take 600 mg by mouth 2 (two) times daily with a meal.   Yes Historical Provider, MD  HYDROcodone-acetaminophen (VICODIN) 5-500 MG per tablet Take 1 tablet by mouth every 6 (six) hours as needed. For pain   Yes Historical Provider, MD  levothyroxine (SYNTHROID, LEVOTHROID) 100 MCG tablet Take 100 mcg by mouth daily.   Yes Historical Provider, MD  meloxicam (MOBIC) 15 MG tablet Take 15 mg by mouth daily.   Yes Historical Provider, MD  PARoxetine (PAXIL) 20 MG tablet Take 20 mg by mouth every morning.   Yes Historical Provider, MD  triamterene-hydrochlorothiazide (DYAZIDE) 37.5-25 MG per capsule Take 1 capsule by mouth daily.   Yes Historical Provider, MD    Allergies:   Allergies  Allergen Reactions  . Amoxicillin Rash    Social History:  reports that she has never smoked. She does not have any smokeless tobacco history on file. She reports that she does not drink alcohol or use illicit drugs.  Family History: neg  Physical Exam: Filed Vitals:   01/02/12 2330 01/02/12 2345 01/03/12 0000 01/03/12 0015  BP: 151/71 157/91    Pulse: 63 61 60 64  Temp:      TempSrc:      Resp: 16 14 17 18   SpO2: 92% 96% 94% 94%   General appearance: alert, cooperative and no distress Neck:  no JVD and supple, symmetrical, trachea midline Lungs: clear to auscultation bilaterally Heart: regular rate and rhythm, S1, S2 normal, no murmur, click, rub or gallop Abdomen: soft, non-tender; bowel sounds normal; no masses,  no organomegaly Extremities: extremities normal, atraumatic, no cyanosis or edema Pulses: 2+ and symmetric Skin: Skin color, texture, turgor normal. No rashes or lesions Neurologic: right eye deviated outwards and cannot internally rotate.  Left eye is normal.  Other cn are normal.  5/5 strenght bilaterally and equal throughout.     Labs on Admission:   [facility] 01/02/12 1609  NA 134*  K 3.6  CL 97  CO2 25  GLUCOSE 83  BUN 21  CREATININE 0.73  CALCIUM 9.8  MG --  PHOS --    Basename 01/02/12 1609  WBC 11.1*  NEUTROABS 7.2  HGB 14.8  HCT 42.4  MCV 91.6  PLT 249    Radiological Exams on Admission: Ct Head Wo Contrast  01/02/2012  *RADIOLOGY REPORT*  Clinical Data: Dizziness, headache, blurred vision  CT HEAD WITHOUT CONTRAST  Technique:  Contiguous axial images were obtained from the base of the skull through the vertex without contrast.  Comparison: 10/03/2010  Findings: No skull fracture is noted.  Paranasal sinuses and mastoid air cells are unremarkable.  No intracranial hemorrhage, mass effect or midline shift.  No hydrocephalus.  The gray  and white matter differentiation is preserved.  No intra or extra-axial fluid collection.  No acute infarction.  No mass lesion is noted on this unenhanced scan.  IMPRESSION: No acute intracranial abnormality.  No significant change.   Original Report Authenticated By: Natasha Mead, M.D.    Mr Brain Wo Contrast  01/02/2012  *RADIOLOGY REPORT*  Clinical Data: Dizziness, headaches, blurred vision  MRI HEAD WITHOUT CONTRAST  Technique:  Multiplanar, multiecho pulse sequences of the brain and surrounding structures were obtained according to standard protocol without intravenous contrast.  Comparison: CT head 05/05/2011   Findings: Small area of acute infarction in the right paramedian pons.  No other acute infarct.  Mild chronic microvascular ischemia in the white matter.  Negative for mass lesion.  The small subdural fluid collections bilaterally.  This fluid appears to be primarily CSF however there may be some mild associated subdural blood bilaterally.  Alternatively, this could represent leptomeningeal thickening from infection or tumor spread. Postcontrast imaging of the brain is suggested to evaluate for meningeal enhancement.  No midline shift.  IMPRESSION: Small area of acute infarction in the right pons.  Small bilateral subdural fluid collections.  This may be associated with a small amount of subdural hemorrhage or possibly leptomeningeal thickening from tumor or infection.  Postcontrast imaging of the brain is suggested for further evaluation.   Original Report Authenticated By: Camelia Phenes, M.D.     Assessment/Plan Present on Admission:  71 yo female with new pontine cva .CVA (cerebral infarction) .Hypertension .Headache .Vision changes  cva w/u.  Neuro following.  Asa per neuro.  Tele.  ekg no arrythmias.    DAVID,RACHAL A 01/03/2012, 1:05 AM

## 2012-01-03 NOTE — ED Provider Notes (Signed)
Date: 01/03/2012  Rate:80  Rhythm: normal sinus rhythm  QRS Axis: normal  Intervals: normal  ST/T Wave abnormalities: nonspecific T wave changes  Conduction Disutrbances:nonspecific intraventricular conduction delay  Narrative Interpretation:   Old EKG Reviewed: none available    Jimmye Norman, NP 01/03/12 0104

## 2012-01-03 NOTE — ED Provider Notes (Signed)
Please see my initial note.   Gerhard Munch, MD 01/03/12 2328

## 2012-01-03 NOTE — Progress Notes (Addendum)
Pt seen and examined, admitted this am by Dr.David with small pontine CVA  Complete CVA workup ASA 325mg  daily Neurology following Smoking cessation  Zannie Cove, MD [phone]

## 2012-01-03 NOTE — ED Provider Notes (Signed)
I saw this ECG and agree with the interpretation.  Gerhard Munch, MD 01/03/12 2328

## 2012-01-03 NOTE — Progress Notes (Signed)
Stroke Team Progress Note  HISTORY Maria Blackwell is an 71 y.o. female a history of hypertension who woke this morning 01/02/2012 with a headache, double vision, and vertigo. She came into the emergency room and had an MRI which shows a new pontine infarct. Her symptoms have been present since they started, and the double vision is the problem concerning her the most. He has had some vertigo, which is mild, has not had any vomiting. She did not taken aspirin prior to admission.  Patient was not a TPA candidate secondary to unknown time of symptom onset. She was admitted for further evaluation and treatment.  SUBJECTIVE No family is at the bedside.  Overall she feels her condition is gradually improving. Though she still has double vision.  OBJECTIVE Most recent Vital Signs: Filed Vitals:   01/03/12 0156 01/03/12 0211 01/03/12 0617 01/03/12 0803  BP:  106/68 122/81 123/70  Pulse:  77 77 77  Temp:  98.6 F (37 C) 98.3 F (36.8 C) 97.8 F (36.6 C)  TempSrc:  Oral Oral Oral  Resp: 16 18 16 18   Height:  5\' 4"  (1.626 m)    Weight:  73.936 kg (163 lb)    SpO2: 94% 96% 98% 95%    MEDICATIONS    . amLODipine  5 mg Oral Daily  . aspirin  300 mg Rectal Daily   Or  . aspirin  325 mg Oral Daily  . calcium carbonate  1 tablet Oral BID  . levothyroxine  100 mcg Oral Daily  . meclizine  25 mg Oral Once  . PARoxetine  20 mg Oral [id number]  . sodium chloride  1,000 mL Intravenous Once  . sodium chloride  3 mL Intravenous Q12H  . triamterene-hydrochlorothiazide  1 each Oral Daily  . DISCONTD: aspirin EC  325 mg Oral Daily  . DISCONTD: calcium carbonate  600 mg Oral BID WC   PRN:  sodium chloride, oxyCODONE-acetaminophen, sodium chloride  Diet:  General thin liquids Activity:  OOB with assistance DVT Prophylaxis:  SCDs   CLINICALLY SIGNIFICANT STUDIES Basic Metabolic Panel:  Lab 01/02/12 1478  NA 134*  K 3.6  CL 97  CO2 25  GLUCOSE 83  BUN 21  CREATININE 0.73  CALCIUM 9.8  MG  --  PHOS --   Liver Function Tests: No results found for this basename: AST:2,ALT:2,ALKPHOS:2,BILITOT:2,PROT:2,ALBUMIN:2 in the last 168 hours CBC:  Lab 01/02/12 1609  WBC 11.1*  NEUTROABS 7.2  HGB 14.8  HCT 42.4  MCV 91.6  PLT 249   Coagulation: No results found for this basename: LABPROT:4,INR:4 in the last 168 hours Cardiac Enzymes: No results found for this basename: CKTOTAL:3,CKMB:3,CKMBINDEX:3,TROPONINI:3 in the last 168 hours Urinalysis: No results found for this basename: COLORURINE:2,APPERANCEUR:2,LABSPEC:2,PHURINE:2,GLUCOSEU:2,HGBUR:2,BILIRUBINUR:2,KETONESUR:2,PROTEINUR:2,UROBILINOGEN:2,NITRITE:2,LEUKOCYTESUR:2 in the last 168 hours Lipid Panel    Component Value Date/Time   CHOL 171 01/03/2012 0628   TRIG 201* 01/03/2012 0628   HDL 35* 01/03/2012 0628   CHOLHDL 4.9 01/03/2012 0628   VLDL 40 01/03/2012 0628   LDLCALC 96 01/03/2012 0628   HgbA1C  No results found for this basename: HGBA1C   Urine Drug Screen:   No results found for this basename: labopia, cocainscrnur, labbenz, amphetmu, thcu, labbarb    Alcohol Level: No results found for this basename: ETH:2 in the last 168 hours  CT of the brain  01/02/2012  No acute intracranial abnormality.  No significant change.       MRI of the brain  01/02/2012   Small area of acute infarction  in the right pons.  Small bilateral subdural fluid collections.  This may be associated with a small amount of subdural hemorrhage or possibly leptomeningeal thickening from tumor or infection.  Postcontrast imaging of the brain is suggested for further evaluation.  MRA of the brain    2D Echocardiogram    Carotid Doppler    CXR    EKG  .   Therapy Recommendations PT - CIR; OT - ; ST -   Physical Exam  Pleasant middle-aged lady currently not in distress.Awake alert. Afebrile. Head is nontraumatic. Neck is supple without bruit. Hearing is normal. Cardiac exam no murmur or gallop. Lungs are clear to auscultation. Distal pulses  are well felt.  Neurological Exam ; Awake alert oriented x 3 normal speech and language.fundi were not visualized. Vision acuity and fields appear normal extraocular moments are full range but there is nystagmus on left lateral gaze. Mild left lower face asymmetry. Tongue midline. No drift. Mild diminished fine finger movements on left. Orbits right over left upper extremity. Mild left grip weak.. Normal sensation . Normal coordination.   ASSESSMENT [name] is a 71 y.o. female presenting with headache, double vision, and vertigo. Imaging confirms a right paramedian pontine infarct. 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  Work up underway. On no antiplatelets prior to admission. Now on aspirin 325 mg orally every day for secondary stroke prevention. Patient with resultant .  Hypertension Overweight, Body mass index is 27.98 kg/(m^2). Abnormal MRI reviewed. Likely just CSF. Do not suspect tumor. As MRA was not done, will check vasculature via CTA and can also look at post contrast CSF.  Hospital day # 1  TREATMENT/PLAN  Continue  aspirin 325 mg orally every day for secondary stroke prevention.  F/u 2D, carotids  CT angio of the head to eval vasculature and post contast CSF  Eye patch, rotate q 4 hr  SHARON BIBY, MSN, RN, ANVP-BC, ANP-BC, GNP-BC [facility] Pager: [phone] 01/03/2012 10:06 AM  Scribe for Dr. Delia Heady, Stroke Center Medical Director, who has personally reviewed chart, pertinent data, examined the patient and developed the plan of care. Pager:  [phone]

## 2012-01-03 NOTE — ED Provider Notes (Signed)
  I performed a history and physical examination of Maria Blackwell and discussed her management with Dr. Aubery Lapping  I agree with the history, physical, assessment, and plan of care, with the following exceptions: None On my exam the patient's dizziness had improved, though she continued to complain of visual changes.  Given this, the patient had expeditious CT, MR.  These studies demonstrated a new focal infarct.  We discussed all findings with our neurology team.  The patient was admitted for further evaluation and management.  I saw the ECG, relevant labs and studies - I agree with the interpretation.   Elyse Jarvis, MD 01/03/12 2329

## 2012-01-03 NOTE — Evaluation (Signed)
Physical Therapy Evaluation Patient Details Name: Maria Blackwell MRN: [id number] DOB: 02-11-1941 Today's Date: 01/03/2012 Time: 0940-1003 PT Time Calculation (min): 23 min  PT Assessment / Plan / Recommendation Clinical Impression  Pt is a 71 y/o female admitted s/p right pons infarct with double vision and headache along with the below PT problem list. Pt would benefit from acute PT to maximize independence and facilitate d/c to possible STSNF due to no assistance available at home for safe d/c.    PT Assessment  Patient needs continued PT services    Follow Up Recommendations  Post acute inpatient    Does the patient have the potential to tolerate intense rehabilitation   No, Recommend SNF  Barriers to Discharge Decreased caregiver support No assist per pt available for safe d/c home.    Equipment Recommendations  Rolling walker with 5" wheels (Question need for RW.)    Recommendations for Other Services     Frequency Min 4X/week    Precautions / Restrictions Precautions Precautions: Fall Restrictions Weight Bearing Restrictions: No Other Position/Activity Restrictions: Having double vision.   Pertinent Vitals/Pain 5/10 headache. Pt repositioned.      Mobility  Bed Mobility Bed Mobility: Supine to Sit;Sit to Supine Supine to Sit: 4: Min guard;HOB flat Sit to Supine: 4: Min guard;HOB flat Details for Bed Mobility Assistance: Guarding for balance with cues for safe sequence. Transfers Transfers: Sit to Stand;Stand to Sit Sit to Stand: 4: Min assist;With upper extremity assist;From bed Stand to Sit: 4: Min assist;With upper extremity assist;To bed Details for Transfer Assistance: Assist for balance with cues for safe sequence. Ambulation/Gait Ambulation/Gait Assistance: 4: Min assist Ambulation Distance (Feet): 90 Feet Assistive device: 1 person hand held assist Ambulation/Gait Assistance Details: Assist for balance with cues for safety and tall posture. Pt  using left HHA and furniture walking with right UE. May benefit from RW trial next ambulation attempt. Gait Pattern: Step-through pattern;Decreased stride length;Trunk flexed;Narrow base of support Stairs: No Wheelchair Mobility Wheelchair Mobility: No Modified Rankin (Stroke Patients Only) Pre-Morbid Rankin Score: No symptoms Modified Rankin: Moderately severe disability    Shoulder Instructions     Exercises     PT Diagnosis: Difficulty walking;Acute pain  PT Problem List: Decreased strength;Decreased activity tolerance;Decreased balance;Decreased mobility;Pain PT Treatment Interventions: DME instruction;Gait training;Stair training;Functional mobility training;Therapeutic activities;Balance training;Neuromuscular re-education;Patient/family education   PT Goals Acute Rehab PT Goals PT Goal Formulation: With patient Time For Goal Achievement: 01/17/12 Potential to Achieve Goals: Good Pt will go Supine/Side to Sit: with supervision PT Goal: Supine/Side to Sit - Progress: Goal set today Pt will go Sit to Supine/Side: with supervision PT Goal: Sit to Supine/Side - Progress: Goal set today Pt will go Sit to Stand: with supervision PT Goal: Sit to Stand - Progress: Goal set today Pt will go Stand to Sit: with supervision PT Goal: Stand to Sit - Progress: Goal set today Pt will Ambulate: >150 feet;with supervision;with least restrictive assistive device PT Goal: Ambulate - Progress: Goal set today  Visit Information  Last PT Received On: 01/03/12 Assistance Needed: +1    Subjective Data  Subjective: "I usually just depend on myself." Patient Stated Goal: Get well.   Prior Functioning  Home Living Lives With: Alone Type of Home: House Home Access: Stairs to enter Entrance Stairs-Number of Steps: 2 Home Layout: One level Home Adaptive Equipment: None Prior Function Level of Independence: Independent Able to Take Stairs?: Yes Driving: Yes Vocation:  Retired Musician: No difficulties Dominant Hand: Right  Cognition  Overall Cognitive Status: Appears within functional limits for tasks assessed/performed Arousal/Alertness: Awake/alert Orientation Level: Appears intact for tasks assessed Behavior During Session: [facility] for tasks performed    Extremity/Trunk Assessment Right Upper Extremity Assessment RUE ROM/Strength/Tone: Within functional levels RUE Sensation: WFL - Light Touch RUE Coordination: WFL - gross/fine motor Left Upper Extremity Assessment LUE ROM/Strength/Tone: Within functional levels LUE Sensation: WFL - Light Touch LUE Coordination: WFL - gross/fine motor Right Lower Extremity Assessment RLE ROM/Strength/Tone: Within functional levels RLE Sensation: WFL - Light Touch RLE Coordination: WFL - gross motor Left Lower Extremity Assessment LLE ROM/Strength/Tone: Within functional levels LLE Sensation: WFL - Light Touch LLE Coordination: WFL - gross motor Trunk Assessment Trunk Assessment: Normal   Balance Balance Balance Assessed: No  End of Session PT - End of Session Equipment Utilized During Treatment: Gait belt Activity Tolerance: Patient tolerated treatment well Patient left: in bed;with call bell/phone within reach;with bed alarm set Nurse Communication: Mobility status  GP Functional Assessment Tool Used: Clinical Judgement. Functional Limitation: Mobility: Walking and moving around Mobility: Walking and Moving Around Current Status [phone]): At least 1 percent but less than 20 percent impaired, limited or restricted Mobility: Walking and Moving Around Goal Status [phone]): 0 percent impaired, limited or restricted   Cephus Shelling 01/03/2012, 10:17 AM  01/03/2012 Cephus Shelling, PT, DPT [phone]

## 2012-01-03 NOTE — Evaluation (Signed)
Speech Language Pathology Evaluation Patient Details Name: Maria Blackwell MRN: [id number] DOB: 26-Apr-1940 Today's Date: 01/03/2012 Time: 1311-     Problem List:  Patient Active Problem List  Diagnosis  . CVA (cerebral infarction)  . Hypertension  . Headache  . Vision changes   Past Medical History:  Past Medical History  Diagnosis Date  . Hypertension   . Hypothyroid    Past Surgical History: History reviewed. No pertinent past surgical history. HPI:  Maria Blackwell is an 71 y.o. female a history of hypertension who woke this morning with a headache, double vision, and vertigo. She came into the emergency room and had an MRI which shows a new pontine infarct. Her symptoms have been present since they started, and the double vision is the problem concerning her the most. He has had some vertigo, which is mild, has not had any vomiting.    Assessment / Plan / Recommendation Clinical Impression  Pt presents with normal cognitive, linguistic, and speech function per assessment.  Passed RN stroke swallow screen.  No SLP needs identified - pt is in agreement.    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[employer])   Cognition  Overall Cognitive Status: Appears within functional limits for tasks assessed Arousal/Alertness: Awake/alert    Comprehension  Auditory Comprehension Overall Auditory Comprehension: Appears within functional limits for tasks assessed Visual Recognition/Discrimination Discrimination: Within Function Limits Reading Comprehension Reading Status: Impaired ("blurrier" per pt)    Expression  Expression Primary Mode of Expression: Verbal Verbal Expression Overall Verbal Expression: Appears within functional limits for tasks assessed Written Expression Dominant Hand: Left Written Expression: Within Functional Limits   Oral / Motor Oral Motor/Sensory Function Overall Oral Motor/Sensory Function: Appears within functional limits for tasks assessed Motor Speech Overall Motor Speech: Appears within functional limits for tasks assessed   GO    Consuelo Suthers L. Samson Frederic, [place] CCC/SLP Pager [phone]  Blenda Mounts Laurice 01/03/2012, 1:29 PM

## 2012-01-03 NOTE — Progress Notes (Signed)
  Echocardiogram 2D Echocardiogram has been performed.  Jorje Guild 01/03/2012, 3:47 PM

## 2012-01-04 MED ORDER — ASPIRIN 325 MG PO TABS: 325.0000 mg | ORAL_TABLET | Freq: Every day | ORAL | Status: DC

## 2012-01-04 NOTE — Care Management Note (Signed)
    Page 1 of 2   01/04/2012     1:13:10 PM   CARE MANAGEMENT NOTE 01/04/2012  Patient:  Maria Blackwell, Maria Blackwell   Account Number:  [id number]  Date Initiated:  01/04/2012  Documentation initiated by:  [facility]  Subjective/Objective Assessment:   Admitted with double vision, headache.  Lives alone but has friends and family close by who can assist as needed.     Action/Plan:   PT eval-recommending HHPT and rolling walker   Anticipated DC Date:  01/04/2012   Anticipated DC Plan:  HOME W HOME HEALTH SERVICES      DC Planning Services  CM consult      Choice offered to / List presented to:  C-1 Patient   DME arranged  Levan Hurst      DME agency  [facility]     HH arranged  HH-2 PT      [facility] agency  [facility]   Status of service:  Completed, signed off Medicare Important Message given?   (If response is "NO", the following Medicare IM given date fields will be blank) Date Medicare IM given:   Date Additional Medicare IM given:    Discharge Disposition:  HOME W HOME HEALTH SERVICES  Per UR Regulation:  Reviewed for med. necessity/level of care/duration of stay  If discussed at Long Length of Stay Meetings, dates discussed:    Comments:  01/04/12 Received orders for HHPT and rolling walker. Spoke with patient about HHC, she chose [facility] from the list of [facility] agencies. Contacted  Ashaad Gaertner at [facility] and requested HHPT. Patient agreeable to getting rolling walker, contacted Darian at [facility] and requested rolling walker to be delivered to her room. Patient discharging today. Jacquelynn Cree RN, BSN, CCM

## 2012-01-04 NOTE — Discharge Summary (Signed)
Physician Discharge Summary  Patient ID: Maria Blackwell MRN: [id number] DOB/AGE: 07/13/1940 71 y.o.  Admit date: 01/02/2012 Discharge date: 01/04/2012  Primary Care Physician:  Samuel Jester, DO   Discharge Diagnoses:   Principal Problem:  *CVA (cerebral infarction) Active Problems:  Hypertension  Headache  Vision changes  Hypothyroidism      Medication List     As of 01/04/2012  5:39 PM    TAKE these medications         amLODipine 5 MG tablet   Commonly known as: NORVASC   Take 5 mg by mouth daily.      aspirin 325 MG tablet   Take 1 tablet (325 mg total) by mouth daily.      calcium carbonate 600 MG Tabs   Commonly known as: OS-CAL   Take 600 mg by mouth 2 (two) times daily with a meal.      HYDROcodone-acetaminophen 5-500 MG per tablet   Commonly known as: VICODIN   Take 1 tablet by mouth every 6 (six) hours as needed. For pain      levothyroxine 100 MCG tablet   Commonly known as: SYNTHROID, LEVOTHROID   Take 100 mcg by mouth daily.      meloxicam 15 MG tablet   Commonly known as: MOBIC   Take 15 mg by mouth daily.      PARoxetine 20 MG tablet   Commonly known as: PAXIL   Take 20 mg by mouth every morning.      triamterene-hydrochlorothiazide 37.5-25 MG per capsule   Commonly known as: DYAZIDE   Take 1 capsule by mouth daily.         Disposition and Follow-up:  PCP in 1 week Dr.Sethi in 1 month  Consults: Dr.Sethi, Neurology  Significant Diagnostic Studies:  Ct Angio Head W/cm &/or Wo Cm  01/03/2012  *RADIOLOGY REPORT*  Clinical Data:  The brain stem infarct.  Hypertension.  Bilateral subdural collections.  CT ANGIOGRAPHY HEAD  Technique:  Multidetector CT imaging of the head was performed using the standard protocol during bolus administration of intravenous contrast.  Multiplanar CT image reconstructions including MIPs were obtained to evaluate the vascular anatomy.  Contrast: 50mL OMNIPAQUE IOHEXOL 350 MG/ML SOLN  Comparison:  MRI  brain 01/02/2012.  CT head without contrast in/22/2013.  Findings:  The postcontrast images of the brain redemonstrate the bilateral subdural collections evident on the MRI scan.  There is no significant midline shift.  No acute infarct or parenchymal hemorrhages evident.  No pathologic enhancement is seen.  CTA images demonstrate atherosclerotic calcifications within the cavernous carotid arteries bilaterally without significant stenosis.  The A1 and M1 segments are normal.  The anterior communicating artery is patent.  The ACA and MCA branch vessels are within normal limits.   The left vertebral artery is the dominant vessel.  The basilar artery is normal.  The left posterior cerebral artery originates from basilar tip.  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  The PCA branch vessels are unremarkable.   Review of the MIP images confirms the above findings.  IMPRESSION:  1.  Mild atherosclerotic changes within the cavernous carotid arteries without significant stenosis. 2.  Otherwise normal variant CTA circle of Willis without significant proximal stenosis, aneurysm, or branch vessel occlusion. 3.  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   Original Report Authenticated By: Jamesetta Orleans. MATTERN, M.D.    Mr Brain Wo Contrast  01/02/2012  *RADIOLOGY REPORT*  Clinical Data: Dizziness, headaches, blurred vision  MRI HEAD WITHOUT CONTRAST  Technique:  Multiplanar, multiecho pulse sequences of the brain and surrounding structures were obtained according to standard protocol without intravenous contrast.  Comparison: CT head 05/05/2011  Findings: Small area of acute infarction in the right paramedian pons.  No other acute infarct.  Mild chronic microvascular ischemia in the white matter.  Negative for mass lesion.  The small subdural fluid collections bilaterally.  This fluid appears to be primarily CSF however there may be some mild associated  subdural blood bilaterally.  Alternatively, this could represent leptomeningeal thickening from infection or tumor spread. Postcontrast imaging of the brain is suggested to evaluate for meningeal enhancement.  No midline shift.  IMPRESSION: Small area of acute infarction in the right pons.  Small bilateral subdural fluid collections.  This may be associated with a small amount of subdural hemorrhage or possibly leptomeningeal thickening from tumor or infection.  Postcontrast imaging of the brain is suggested for further evaluation.   Original Report Authenticated By: Camelia Phenes, M.D.     Brief H and P: 71 yo female comes in with a day of double vision and headache. Double vision is worse when she looks to the left and her right eye isnt acting right. No recent illnesses. No fevers/n/v/numb/tingling anywhere. No cp/sob. No weakness anywhere. No head trauma.    Hospital Course:  Lipid Panel    Component  Value  Date/Time    CHOL  171  01/03/2012 0628    TRIG  201*  01/03/2012 0628    HDL  35*  01/03/2012 0628    CHOLHDL  4.9  01/03/2012 0628    VLDL  40  01/03/2012 0628    LDLCALC  96  01/03/2012 0628    HgbA1C  Lab Results   Component  Value  Date    HGBA1C  5.9*  01/03/2012    Urine Drug Screen:  No results found for this basename: labopia, cocainscrnur, labbenz, amphetmu, thcu, labbarb     CT of the brain 01/02/2012 No acute intracranial abnormality. No significant change.   MRI of the brain 01/02/2012 Small area of acute infarction in the right pons. Small bilateral subdural fluid collections. This may be associated with a small amount of subdural hemorrhage or possibly leptomeningeal thickening from tumor or infection. Postcontrast imaging of the brain is suggested for further evaluation.   MRA of the brain See CTA head   CT angio head 1. Mild atherosclerotic changes within the cavernous carotid arteries without significant stenosis. 2. Otherwise normal variant CTA circle of  Willis without significant proximal stenosis, aneurysm, or branch vessel occlusion. 3. 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   2D Echocardiogram EF 55-60% with no source of embolus.   Carotid Doppler: No obvious evidence of hemodynamically significant internal carotid artery stenosis bilaterally. Vertebral arteries are patent with antegrade flow.  Hospital course [name] is a 71 y.o. female presenting with headache, double vision, and vertigo. Imaging confirms a right paramedian pontine infarct. 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 On no antiplatelets prior to admission. Now on aspirin 325 mg orally every day for secondary stroke prevention. Patient with resultant diplopia, dizziness, which has since improved. Hypertension  Overweight, Body mass index is 27.98 kg/(m^2).  Abnormal MRI reviewed. Likely just CSF. Do not suspect tumor. CTA does not support SDH or tumor. No abnormalities.  TREATMENT/PLAN  Continue aspirin 325 mg  orally every day for secondary stroke prevention.  Eye patch, rotate q 4 hr  Physical therapy ambulated with the patient and recommended home therapy      Time spent on Discharge:  Signed: Shamond Skelton [facility]  01/04/2012, 5:39 PM

## 2012-01-04 NOTE — Progress Notes (Signed)
Patient discharged this afternoon, discharge and medication papers given and dully signed. All questions answered and patient assessments remained unchanged prior to discharge.

## 2012-01-04 NOTE — Progress Notes (Signed)
Stroke Team Progress Note  HISTORY Maria Blackwell is an 71 y.o. female a history of hypertension who woke this morning 01/02/2012 with a headache, double vision, and vertigo. She came into the emergency room and had an MRI which shows a new pontine infarct. Her symptoms have been present since they started, and the double vision is the problem concerning her the most. He has had some vertigo, which is mild, has not had any vomiting. She did not taken aspirin prior to admission.  Patient was not a TPA candidate secondary to unknown time of symptom onset. She was admitted for further evaluation and treatment.  SUBJECTIVE Double vision improving.  OBJECTIVE Most recent Vital Signs: Filed Vitals:   01/03/12 1851 01/03/12 2200 01/04/12 0150 01/04/12 0600  BP: 125/65 152/92 135/82 127/66  Pulse: 68 72 76 75  Temp: 97.6 F (36.4 C) 97.5 F (36.4 C) 97.9 F (36.6 C) 97.5 F (36.4 C)  TempSrc: Oral Oral Oral Oral  Resp: 18 18 18 18   Height:      Weight:      SpO2: 98% 98% 97% 96%    MEDICATIONS     . amLODipine  5 mg Oral Daily  . aspirin  300 mg Rectal Daily   Or  . aspirin  325 mg Oral Daily  . calcium carbonate  1 tablet Oral BID  . levothyroxine  100 mcg Oral Daily  . PARoxetine  20 mg Oral [id number]  . sodium chloride  3 mL Intravenous Q12H  . triamterene-hydrochlorothiazide  1 each Oral Daily   PRN:  sodium chloride, iohexol, oxyCODONE-acetaminophen, sodium chloride  Diet:  General thin liquids Activity:  OOB with assistance DVT Prophylaxis:  SCDs   CLINICALLY SIGNIFICANT STUDIES Basic Metabolic Panel:   Lab 01/02/12 1609  NA 134*  K 3.6  CL 97  CO2 25  GLUCOSE 83  BUN 21  CREATININE 0.73  CALCIUM 9.8  MG --  PHOS --   CBC:   Lab 01/02/12 1609  WBC 11.1*  NEUTROABS 7.2  HGB 14.8  HCT 42.4  MCV 91.6  PLT 249   Lipid Panel    Component Value Date/Time   CHOL 171 01/03/2012 0628   TRIG 201* 01/03/2012 0628   HDL 35* 01/03/2012 0628   CHOLHDL  4.9 01/03/2012 0628   VLDL 40 01/03/2012 0628   LDLCALC 96 01/03/2012 0628   HgbA1C  Lab Results  Component Value Date   HGBA1C 5.9* 01/03/2012   Urine Drug Screen:   No results found for this basename: labopia,  cocainscrnur,  labbenz,  amphetmu,  thcu,  labbarb    Alcohol Level: No results found for this basename: ETH:2 in the last 168 hours  CT of the brain  01/02/2012  No acute intracranial abnormality.  No significant change.       MRI of the brain  01/02/2012   Small area of acute infarction in the right pons.  Small bilateral subdural fluid collections.  This may be associated with a small amount of subdural hemorrhage or possibly leptomeningeal thickening from tumor or infection.  Postcontrast imaging of the brain is suggested for further evaluation.  MRA of the brain  See CTA head  CT angio head 1. Mild atherosclerotic changes within the cavernous carotid arteries without significant stenosis. 2. Otherwise normal variant CTA circle of Willis without significant proximal stenosis, aneurysm, or branch vessel  occlusion. 3. 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   2D  Echocardiogram  EF 55-60% with no source of embolus.   Carotid Doppler    CXR    EKG  .normal sinus rhythm, LAFB  Therapy Recommendations PT - CIR; OT - ; ST - none  Physical Exam  Pleasant middle-aged lady currently not in distress.Awake alert. Afebrile. Head is nontraumatic. Neck is supple without bruit. Hearing is normal. Cardiac exam no murmur or gallop. Lungs are clear to auscultation. Distal pulses are well felt.  Neurological Exam ; Awake alert oriented x 3 normal speech and language.fundi were not visualized. Vision acuity and fields appear normal extraocular moments are full range but there is nystagmus on left lateral gaze. Mild left lower face asymmetry. Tongue midline. No drift. Mild diminished fine finger movements on left. Orbits right over left upper  extremity. Mild left grip weak.. Normal sensation . Normal coordination.   ASSESSMENT [name] is a 71 y.o. female presenting with headache, double vision, and vertigo. Imaging confirms a right paramedian pontine infarct. 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  Work up underway. On no antiplatelets prior to admission. Now on aspirin 325 mg orally every day for secondary stroke prevention. Patient with resultant diplopia, dizziness.  Hypertension Overweight, Body mass index is 27.98 kg/(m^2). Abnormal MRI reviewed. Likely just CSF. Do not suspect tumor. CTA does not support SDH or tumor. No abnormalities.  Hospital day # 2  TREATMENT/PLAN  Continue  aspirin 325 mg orally every day for secondary stroke prevention.  F/u carotids  Eye patch, rotate q 4 hr  SHARON BIBY, MSN, RN, ANVP-BC, ANP-BC, GNP-BC [facility] Pager: [phone] 01/04/2012 8:23 AM  Scribe for Dr. Delia Heady, Stroke Center Medical Director, who has personally reviewed chart, pertinent data, examined the patient and developed the plan of care. Pager:  [phone]

## 2012-01-04 NOTE — Clinical Social Work Note (Signed)
Clinical Social Work  CSW met with pt to address discharge plan. PT is now recommending home health PT with 24 hour supervision. Pt has 24 hour supervision and would like to discharge home. RNCM is aware and arranged home health services. CSW is signing off as no further needs identified.   Dede Query, MSW, Theresia Majors [phone]

## 2012-01-04 NOTE — Clinical Social Work Psychosocial (Signed)
     Clinical Social Work Department BRIEF PSYCHOSOCIAL ASSESSMENT 01/04/2012  Patient:  Maria Blackwell, Maria Blackwell     Account Number:  [id number]     Admit date:  01/02/2012  Clinical Social Worker:  Peggyann Shoals  Date/Time:  01/03/2012 01:00 PM  Referred by:  Physician  Date Referred:  01/03/2012 Referred for  SNF Placement   Other Referral:   Interview type:  Patient Other interview type:    PSYCHOSOCIAL DATA Living Status:  ALONE Admitted from facility:   Level of care:   Primary support name:  Eusebio Friendly Primary support relationship to patient:  CHILD, ADULT Degree of support available:   Supportive    CURRENT CONCERNS Current Concerns  Post-Acute Placement   Other Concerns:    SOCIAL WORK ASSESSMENT / PLAN CSW met with pt and pt's sister to address consult. CSW introducded herself and explained role of social work. CSW also explained the process of discharging to SNF with Medicare as a primary insurance. Pt shared that her daughter had just left to return to work and it was alright for this CSW to speak with her daughter.    CSW will attempt to reach pt's daughter to address discharge planning. CSW will continue to follow.   Assessment/plan status:  Psychosocial Support/Ongoing Assessment of Needs Other assessment/ plan:   Information/referral to community resources:   SNF List    PATIENTS/FAMILYS RESPONSE TO PLAN OF CARE: Pt was alert and oriented. Pt was agreeable to this CSW speaking with her daughter.

## 2012-01-04 NOTE — Progress Notes (Signed)
Physical Therapy Treatment Patient Details Name: Maria Blackwell MRN: [id number] DOB: [birth year] Today's Date: 01/04/2012 Time: 7829-5621 PT Time Calculation (min): 23 min  PT Assessment / Plan / Recommendation Comments on Treatment Session  Patient is s/p CVA. Progressing very well with ambulation with no increase in dizziness. Patient son's mother in law was present and stated that patient could return home with her and she will have 24/7 assist at home. Patient safe to DC home with assistance as needed for moblity and safety. Spoke with CSW, Dr. Jomarie Longs and case manager and all agreed that patient is safe to DC home    Follow Up Recommendations  Home health PT;Supervision for mobility/OOB     Does the patient have the potential to tolerate intense rehabilitation     Barriers to Discharge        Equipment Recommendations  Rolling walker with 5" wheels    Recommendations for Other Services    Frequency Min 4X/week   Plan Discharge plan remains appropriate;Frequency remains appropriate    Precautions / Restrictions Precautions Precautions: Fall   Pertinent Vitals/Pain     Mobility  Bed Mobility Supine to Sit: 6: Modified independent (Device/Increase time) Sit to Supine: 6: Modified independent (Device/Increase time) Transfers Sit to Stand: 6: Modified independent (Device/Increase time) Stand to Sit: 6: Modified independent (Device/Increase time) Ambulation/Gait Ambulation/Gait Assistance: 5: Supervision;4: Min guard Ambulation Distance (Feet): 800 Feet Assistive device: Rolling walker;None Ambulation/Gait Assistance Details: Patient ambulated supervision with use of RW and MinGuard without Rw. Patient with minor swayying without RW but no LOB.  Gait Pattern: Step-through pattern Modified Rankin (Stroke Patients Only) Pre-Morbid Rankin Score: No symptoms Modified Rankin: Moderately severe disability    Exercises     PT Diagnosis:    PT Problem List:   PT  Treatment Interventions:     PT Goals Acute Rehab PT Goals PT Goal: Supine/Side to Sit - Progress: Met PT Goal: Sit to Supine/Side - Progress: Met PT Goal: Sit to Stand - Progress: Met PT Goal: Stand to Sit - Progress: Met PT Goal: Ambulate - Progress: Progressing toward goal  Visit Information  Last PT Received On: 01/04/12 Assistance Needed: +1    Subjective Data      Cognition  Overall Cognitive Status: Appears within functional limits for tasks assessed/performed Arousal/Alertness: Awake/alert Orientation Level: Appears intact for tasks assessed Behavior During Session: [facility] for tasks performed    Balance     End of Session PT - End of Session Equipment Utilized During Treatment: Gait belt Activity Tolerance: Patient tolerated treatment well Patient left: in bed;with call bell/phone within reach Nurse Communication: Mobility status   GP     Fredrich Birks 01/04/2012, 11:43 AM 01/04/2012 Fredrich Birks PTA [phone] pager [phone] office

## 2012-01-04 NOTE — Progress Notes (Signed)
*  PRELIMINARY RESULTS* Vascular Ultrasound Carotid Duplex (Doppler) has been completed.  No obvious evidence of hemodynamically significant internal carotid artery stenosis bilaterally. Vertebral arteries are patent with antegrade flow.  01/04/2012 9:45 AM Gertie Fey, RDMS, RDCS

## 2012-01-05 NOTE — Progress Notes (Signed)
Agree with updated d/c recommendations.  01/05/2012 Cephus Shelling, PT, DPT [phone]

## 2012-01-15 LAB — HEMOGLOBIN A1C: Hgb A1c MFr Bld: 5.9 % — ABNORMAL HIGH (ref <5.7)

## 2012-10-28 ENCOUNTER — Encounter: Payer: Self-pay | Admitting: Neurology

## 2012-10-29 ENCOUNTER — Ambulatory Visit: Payer: Self-pay | Admitting: Neurology

## 2013-03-13 DIAGNOSIS — I639 Cerebral infarction, unspecified: Secondary | ICD-10-CM

## 2013-03-13 HISTORY — DX: Cerebral infarction, unspecified: I63.9

## 2016-01-25 ENCOUNTER — Other Ambulatory Visit (HOSPITAL_COMMUNITY): Payer: Self-pay | Admitting: Neurosurgery

## 2016-01-25 ENCOUNTER — Other Ambulatory Visit: Payer: Self-pay | Admitting: Neurosurgery

## 2016-01-25 DIAGNOSIS — I671 Cerebral aneurysm, nonruptured: Secondary | ICD-10-CM

## 2016-02-17 ENCOUNTER — Ambulatory Visit (HOSPITAL_COMMUNITY)
Admission: RE | Admit: 2016-02-17 | Discharge: 2016-02-17 | Disposition: A | Discharge status: Home / Self Care | Payer: Medicare Other | Source: Ambulatory Visit | Attending: Neurosurgery | Admitting: Neurosurgery

## 2016-02-17 ENCOUNTER — Encounter (HOSPITAL_COMMUNITY): Payer: Self-pay

## 2016-02-17 DIAGNOSIS — I671 Cerebral aneurysm, nonruptured: Secondary | ICD-10-CM | POA: Diagnosis present

## 2016-02-17 LAB — CBC WITH DIFFERENTIAL/PLATELET
BASOS PCT: 1 %
Basophils Absolute: 0.1 10*3/uL (ref 0.0–0.1)
Eosinophils Absolute: 0.2 10*3/uL (ref 0.0–0.7)
Eosinophils Relative: 2 %
HEMATOCRIT: 43 % (ref 36.0–46.0)
HEMOGLOBIN: 14.7 g/dL (ref 12.0–15.0)
LYMPHS ABS: 2.6 10*3/uL (ref 0.7–4.0)
Lymphocytes Relative: 24 %
MCH: 32 pg (ref 26.0–34.0)
MCHC: 34.2 g/dL (ref 30.0–36.0)
MCV: 93.5 fL (ref 78.0–100.0)
MONOS PCT: 11 %
Monocytes Absolute: 1.2 10*3/uL — ABNORMAL HIGH (ref 0.1–1.0)
NEUTROS ABS: 6.6 10*3/uL (ref 1.7–7.7)
NEUTROS PCT: 62 %
Platelets: 202 10*3/uL (ref 150–400)
RBC: 4.6 MIL/uL (ref 3.87–5.11)
RDW: 13.3 % (ref 11.5–15.5)
WBC: 10.6 10*3/uL — ABNORMAL HIGH (ref 4.0–10.5)

## 2016-02-17 LAB — BASIC METABOLIC PANEL
ANION GAP: 9 (ref 5–15)
BUN: 17 mg/dL (ref 6–20)
CHLORIDE: 101 mmol/L (ref 101–111)
CO2: 27 mmol/L (ref 22–32)
Calcium: 9.7 mg/dL (ref 8.9–10.3)
Creatinine, Ser: 0.96 mg/dL (ref 0.44–1.00)
GFR calc non Af Amer: 57 mL/min — ABNORMAL LOW (ref >60)
Glucose, Bld: 101 mg/dL — ABNORMAL HIGH (ref 65–99)
POTASSIUM: 2.9 mmol/L — AB (ref 3.5–5.1)
Sodium: 137 mmol/L (ref 135–145)

## 2016-02-17 LAB — PROTIME-INR
INR: 0.91
Prothrombin Time: 12.3 seconds (ref 11.4–15.2)

## 2016-02-17 LAB — APTT: aPTT: 33 seconds (ref 24–36)

## 2016-02-17 MED ORDER — SODIUM CHLORIDE 0.9 % IV SOLN: 30.0000 meq | Freq: Once | INTRAVENOUS | Status: DC

## 2016-08-16 ENCOUNTER — Other Ambulatory Visit: Payer: Self-pay | Admitting: Neurosurgery

## 2016-08-16 DIAGNOSIS — I671 Cerebral aneurysm, nonruptured: Secondary | ICD-10-CM

## 2016-09-19 ENCOUNTER — Other Ambulatory Visit: Payer: Self-pay | Admitting: Neurosurgery

## 2016-09-19 ENCOUNTER — Ambulatory Visit (HOSPITAL_COMMUNITY)
Admission: RE | Admit: 2016-09-19 | Discharge: 2016-09-19 | Disposition: A | Discharge status: Home / Self Care | Payer: Medicare Other | Source: Ambulatory Visit | Attending: Neurosurgery | Admitting: Neurosurgery

## 2016-09-19 DIAGNOSIS — R42 Dizziness and giddiness: Secondary | ICD-10-CM | POA: Insufficient documentation

## 2016-09-19 DIAGNOSIS — E039 Hypothyroidism, unspecified: Secondary | ICD-10-CM | POA: Diagnosis not present

## 2016-09-19 DIAGNOSIS — I671 Cerebral aneurysm, nonruptured: Secondary | ICD-10-CM

## 2016-09-19 DIAGNOSIS — Z79899 Other long term (current) drug therapy: Secondary | ICD-10-CM | POA: Insufficient documentation

## 2016-09-19 DIAGNOSIS — Z8673 Personal history of transient ischemic attack (TIA), and cerebral infarction without residual deficits: Secondary | ICD-10-CM | POA: Diagnosis not present

## 2016-09-19 DIAGNOSIS — Z8249 Family history of ischemic heart disease and other diseases of the circulatory system: Secondary | ICD-10-CM | POA: Diagnosis not present

## 2016-09-19 DIAGNOSIS — F418 Other specified anxiety disorders: Secondary | ICD-10-CM | POA: Diagnosis not present

## 2016-09-19 DIAGNOSIS — F17210 Nicotine dependence, cigarettes, uncomplicated: Secondary | ICD-10-CM | POA: Insufficient documentation

## 2016-09-19 DIAGNOSIS — Z880 Allergy status to penicillin: Secondary | ICD-10-CM | POA: Insufficient documentation

## 2016-09-19 DIAGNOSIS — Z7982 Long term (current) use of aspirin: Secondary | ICD-10-CM | POA: Insufficient documentation

## 2016-09-19 HISTORY — PX: IR US GUIDE VASC ACCESS RIGHT: IMG2390

## 2016-09-19 HISTORY — PX: IR ANGIO INTRA EXTRACRAN SEL COM CAROTID INNOMINATE BILAT MOD SED: IMG5360

## 2016-09-19 HISTORY — PX: IR ANGIO VERTEBRAL SEL VERTEBRAL UNI L MOD SED: IMG5367

## 2016-09-19 LAB — BASIC METABOLIC PANEL
ANION GAP: 11 (ref 5–15)
BUN: 17 mg/dL (ref 6–20)
CO2: 24 mmol/L (ref 22–32)
Calcium: 9.5 mg/dL (ref 8.9–10.3)
Chloride: 103 mmol/L (ref 101–111)
Creatinine, Ser: 1.04 mg/dL — ABNORMAL HIGH (ref 0.44–1.00)
GFR calc Af Amer: 60 mL/min — ABNORMAL LOW (ref >60)
GFR calc non Af Amer: 52 mL/min — ABNORMAL LOW (ref >60)
GLUCOSE: 108 mg/dL — AB (ref 65–99)
POTASSIUM: 3.4 mmol/L — AB (ref 3.5–5.1)
Sodium: 138 mmol/L (ref 135–145)

## 2016-09-19 LAB — URINALYSIS, ROUTINE W REFLEX MICROSCOPIC
BILIRUBIN URINE: NEGATIVE
Glucose, UA: NEGATIVE mg/dL
Hgb urine dipstick: NEGATIVE
KETONES UR: NEGATIVE mg/dL
Nitrite: NEGATIVE
PH: 6 (ref 5.0–8.0)
PROTEIN: NEGATIVE mg/dL
Specific Gravity, Urine: 1.013 (ref 1.005–1.030)

## 2016-09-19 LAB — CBC WITH DIFFERENTIAL/PLATELET
Basophils Absolute: 0.1 10*3/uL (ref 0.0–0.1)
Basophils Relative: 1 %
Eosinophils Absolute: 0.3 10*3/uL (ref 0.0–0.7)
Eosinophils Relative: 3 %
HEMATOCRIT: 40.4 % (ref 36.0–46.0)
Hemoglobin: 13.7 g/dL (ref 12.0–15.0)
LYMPHS PCT: 26 %
Lymphs Abs: 3 10*3/uL (ref 0.7–4.0)
MCH: 32.8 pg (ref 26.0–34.0)
MCHC: 33.9 g/dL (ref 30.0–36.0)
MCV: 96.7 fL (ref 78.0–100.0)
MONO ABS: 0.8 10*3/uL (ref 0.1–1.0)
MONOS PCT: 7 %
NEUTROS ABS: 7.3 10*3/uL (ref 1.7–7.7)
Neutrophils Relative %: 63 %
Platelets: 258 10*3/uL (ref 150–400)
RBC: 4.18 MIL/uL (ref 3.87–5.11)
RDW: 13.9 % (ref 11.5–15.5)
WBC: 11.4 10*3/uL — ABNORMAL HIGH (ref 4.0–10.5)

## 2016-09-19 LAB — PROTIME-INR
INR: 0.91
Prothrombin Time: 12.3 seconds (ref 11.4–15.2)

## 2016-09-19 LAB — APTT: aPTT: 35 seconds (ref 24–36)

## 2016-09-19 MED ORDER — HEPARIN SODIUM (PORCINE) 1000 UNIT/ML IJ SOLN
INTRAMUSCULAR | Status: AC
  Filled 2016-09-19: qty 2

## 2016-09-19 MED ORDER — HYDROCODONE-ACETAMINOPHEN 5-325 MG PO TABS: 1.0000 | ORAL_TABLET | ORAL | Status: DC | PRN

## 2016-09-19 MED ORDER — SODIUM CHLORIDE 0.9 % IV SOLN
INTRAVENOUS | Status: AC | PRN
  Administered 2016-09-19: 10 mL/h via INTRAVENOUS

## 2016-09-19 MED ORDER — FENTANYL CITRATE (PF) 100 MCG/2ML IJ SOLN
INTRAMUSCULAR | Status: AC | PRN
  Administered 2016-09-19: 25 ug via INTRAVENOUS

## 2016-09-19 MED ORDER — CHLORHEXIDINE GLUCONATE CLOTH 2 % EX PADS: 6.0000 | MEDICATED_PAD | Freq: Once | CUTANEOUS | Status: DC

## 2016-09-19 MED ORDER — LIDOCAINE HCL (PF) 1 % IJ SOLN
INTRAMUSCULAR | Status: AC
  Filled 2016-09-19: qty 30

## 2016-09-19 MED ORDER — MIDAZOLAM HCL 2 MG/2ML IJ SOLN
INTRAMUSCULAR | Status: AC | PRN
  Administered 2016-09-19: 1 mg via INTRAVENOUS

## 2016-09-19 MED ORDER — MIDAZOLAM HCL 2 MG/2ML IJ SOLN
INTRAMUSCULAR | Status: AC
  Filled 2016-09-19: qty 2

## 2016-09-19 MED ORDER — FENTANYL CITRATE (PF) 100 MCG/2ML IJ SOLN
INTRAMUSCULAR | Status: AC
  Filled 2016-09-19: qty 2

## 2016-09-19 MED ORDER — IOPAMIDOL (ISOVUE-300) INJECTION 61%
INTRAVENOUS | Status: AC
  Administered 2016-09-19: 75 mL
  Filled 2016-09-19: qty 150

## 2016-09-19 MED ORDER — SODIUM CHLORIDE 0.9 % IV SOLN: INTRAVENOUS | Status: DC

## 2016-09-19 MED ORDER — LIDOCAINE HCL 1 % IJ SOLN
INTRAMUSCULAR | Status: AC | PRN
  Administered 2016-09-19: 10 mL

## 2016-09-19 MED ORDER — HEPARIN SODIUM (PORCINE) 1000 UNIT/ML IJ SOLN
INTRAMUSCULAR | Status: AC | PRN
  Administered 2016-09-19: 2000 [IU] via INTRAVENOUS

## 2016-09-19 NOTE — H&P (Signed)
CC:  Aneurysm  HPI:  Maria Blackwell is a 76 year old woman I'm seeing for the above, at the request of Dr. Charm Barges. She initially presented with some dizziness, as well as the feeling of lightheadedness. She has a history of stroke several years ago, and multiple family members with brain aneurysms. 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 She was therefore referred for neurosurgical evaluation. In speaking with her, she says that the dizziness occurs primarily when she lays flat with her head back, but has gotten a little bit better since it started. She does also describe a feeling of lightheadedness when she first stands up from a seated or lying down position. She is not describing any changes in her vision recently, nor does she have any new numbness tingling or weakness of the arms or legs. She has a fairly strong family history of intracranial aneurysms, and says that her older brother died of a drain aneurysm, she also has a sister that had a aneurysm rupture and underwent "stenting." She is a tobacco smoker, finishing a pack of cigarettes in about 2-3 days. She does have a history of hypertension, although she says it is well-controlled with medication.       PMH: Past Medical History:  Diagnosis Date  . Depression with anxiety   . Hypertension   . Hypothyroid     PSH: Past Surgical History:  Procedure Laterality Date  . CHOLECYSTECTOMY    . TUMOR REMOVAL      SH: Social History  Substance Use Topics  . Smoking status: Never Smoker  . Smokeless tobacco: Never Used  . Alcohol use No    MEDS: Prior to Admission medications   Medication Sig Start Date End Date Taking? Authorizing Provider  amLODipine (NORVASC) 10 MG tablet Take 10 mg by mouth daily.    Yes [provider]  aspirin 325 MG tablet Take 1 tablet (325 mg total) by mouth daily. 01/04/12  Yes Zannie Cove, MD  Calcium Carb-Cholecalciferol (CALCIUM 600 + D PO) Take 1 tablet by mouth 2 (two) times daily.   Yes [provider]  clobetasol cream (TEMOVATE) 0.05 % Apply 1 application topically 2 (two) times daily.   Yes [provider]  ergocalciferol (VITAMIN D2) 50000 units capsule Take 50,000 Units by mouth 2 (two) times a week.   Yes [provider]  gabapentin (NEURONTIN) 300 MG capsule Take 300 mg by mouth 2 (two) times daily.   Yes [provider]  HYDROcodone-acetaminophen (NORCO/VICODIN) 5-325 MG tablet Take 1 tablet by mouth 3 (three) times daily.   Yes [provider]  ipratropium-albuterol (DUONEB) 0.5-2.5 (3) MG/3ML SOLN Take 3 mLs by nebulization every 4 (four) hours as needed (for shortness of breath).   Yes [provider]  levothyroxine (SYNTHROID, LEVOTHROID) 100 MCG tablet Take 100 mcg by mouth daily.   Yes [provider]  OXYGEN Inhale 2 L into the lungs at bedtime.   Yes [provider]  PARoxetine (PAXIL) 20 MG tablet Take 20 mg by mouth every morning.   Yes [provider]  potassium citrate (UROCIT-K) 10 MEQ (1080 MG) SR tablet Take 10 mEq by mouth 2 (two) times daily.   Yes [provider]  triamterene-hydrochlorothiazide (DYAZIDE) 37.5-25 MG per capsule Take 1 capsule by mouth daily.   Yes [provider]  albuterol (PROVENTIL HFA;VENTOLIN HFA) 108 (90 Base) MCG/ACT inhaler Inhale 2 puffs  into the lungs every 6 (six) hours as needed for wheezing or shortness of breath.    [provider]    ALLERGY: Allergies  Allergen Reactions  . 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 She has strong vascular risk factors, including a strong family history of intracranial aneurysms and subarachnoid hemorrhage, tobacco smoking, and hypertension. In addition, it is her preference to have the aneurysm treated if it can be done relatively safely.   PLAN: Will proceed with diagnostic angiogram  I have reviewed the risks, benefits, and alternatives to the angiogram with the patient in the office. All questions were answered.

## 2016-09-19 NOTE — Sedation Documentation (Signed)
5 Fr. Exoseal to right groin 

## 2016-09-19 NOTE — Discharge Instructions (Addendum)
Cerebral Angiogram, Care After °Refer to this sheet in the next few weeks. These instructions provide you with information on caring for yourself after your procedure. Your health care provider may also give you more specific instructions. Your treatment has been planned according to current medical practices, but problems sometimes occur. Call your health care provider if you have any problems or questions after your procedure. °What can I expect after the procedure? °After your procedure, it is typical to have the following: °· Bruising at the catheter insertion site that usually fades within 1-2 weeks. °· Blood collecting in the tissue (hematoma) that may be painful to the touch. It should usually decrease in size and tenderness within 1-2 weeks. °· A mild headache. ° °Follow these instructions at home: °· Take medicines only as directed by your health care provider. °· You may shower 24-48 hours after the procedure or as directed by your health care provider. Remove the bandage (dressing) and gently wash the site with plain soap and water. Pat the area dry with a clean towel. Do not rub the site, because this may cause bleeding. °· Do not take baths, swim, or use a hot tub until your health care provider approves. °· Check your insertion site every day for redness, swelling, or drainage. °· Do not apply powder or lotion to the site. °· Do not lift over 10 lb (4.5 kg) for 5 days after your procedure or as directed by your health care provider. °· Ask your health care provider when it is okay to: °? Return to work or school. °? Resume usual physical activities or sports. °? Resume sexual activity. °· Do not drive home if you are discharged the same day as the procedure. Have someone else drive you. °· You may drive 24 hours after the procedure unless otherwise instructed by your health care provider. °· Do not operate machinery or power tools for 24 hours after the procedure or as directed by your health care  provider. °· If your procedure was done as an outpatient procedure, which means that you went home the same day as your procedure, a responsible adult should be with you for the first 24 hours after you arrive home. °· Keep all follow-up visits as directed by your health care provider. This is important. °Contact a health care provider if: °· You have a fever. °· You have chills. °· You have increased bleeding from the catheter insertion site. Hold pressure on the site. °Get help right away if: °· You have vision changes or loss of vision. °· You have numbness or weakness on one side of your body. °· You have difficulty talking, or you have slurred speech or cannot speak (aphasia). °· You feel confused or have difficulty remembering. °· You have unusual pain at the catheter insertion site. °· You have redness, warmth, or swelling at the catheter insertion site. °· You have drainage (other than a small amount of blood on the dressing) from the catheter insertion site. °· The catheter insertion site is bleeding, and the bleeding does not stop after 30 minutes of holding steady pressure on the site. °These symptoms may represent a serious problem that is an emergency. Do not wait to see if the symptoms will go away. Get medical help right away. Call your local emergency services (911 in [place] Do not drive yourself to the hospital. °This information is not intended to replace advice given to you by your health care provider. Make sure you discuss any questions   you have with your health care provider. °Document Released: 07/14/2013 Document Revised: 08/05/2015 Document Reviewed: 03/12/2013 °Elsevier Interactive Patient Education © 2017 [employer] °Moderate Conscious Sedation, Adult, Care After °These instructions provide you with information about caring for yourself after your procedure. Your health care provider may also give you more specific instructions. Your treatment has been planned according to current  medical practices, but problems sometimes occur. Call your health care provider if you have any problems or questions after your procedure. °What can I expect after the procedure? °After your procedure, it is common: °· To feel sleepy for several hours. °· To feel clumsy and have poor balance for several hours. °· To have poor judgment for several hours. °· To vomit if you eat too soon. ° °Follow these instructions at home: °For at least 24 hours after the procedure: ° °· Do not: °? Participate in activities where you could fall or become injured. °? Drive. °? Use heavy machinery. °? Drink alcohol. °? Take sleeping pills or medicines that cause drowsiness. °? Make important decisions or sign legal documents. °? Take care of children on your own. °· Rest. °Eating and drinking °· Follow the diet recommended by your health care provider. °· If you vomit: °? Drink water, juice, or soup when you can drink without vomiting. °? Make sure you have little or no nausea before eating solid foods. °General instructions °· Have a responsible adult stay with you until you are awake and alert. °· Take over-the-counter and prescription medicines only as told by your health care provider. °· If you smoke, do not smoke without supervision. °· Keep all follow-up visits as told by your health care provider. This is important. °Contact a health care provider if: °· You keep feeling nauseous or you keep vomiting. °· You feel light-headed. °· You develop a rash. °· You have a fever. °Get help right away if: °· You have trouble breathing. °This information is not intended to replace advice given to you by your health care provider. Make sure you discuss any questions you have with your health care provider. °Document Released: 12/18/2012 Document Revised: 08/02/2015 Document Reviewed: 06/19/2015 °Elsevier Interactive Patient Education © 2018 [employer] ° °

## 2016-09-20 ENCOUNTER — Encounter (HOSPITAL_COMMUNITY): Payer: Self-pay | Admitting: Neurosurgery

## 2016-09-28 ENCOUNTER — Emergency Department (HOSPITAL_COMMUNITY): Payer: Medicare Other

## 2016-09-28 ENCOUNTER — Encounter (HOSPITAL_COMMUNITY): Payer: Self-pay | Admitting: *Deleted

## 2016-09-28 ENCOUNTER — Emergency Department (HOSPITAL_COMMUNITY)
Admission: EM | Admit: 2016-09-28 | Discharge: 2016-09-28 | Disposition: A | Discharge status: Home / Self Care | Payer: Medicare Other | Attending: Emergency Medicine | Admitting: Emergency Medicine

## 2016-09-28 DIAGNOSIS — G44209 Tension-type headache, unspecified, not intractable: Secondary | ICD-10-CM | POA: Diagnosis not present

## 2016-09-28 DIAGNOSIS — R112 Nausea with vomiting, unspecified: Secondary | ICD-10-CM | POA: Insufficient documentation

## 2016-09-28 DIAGNOSIS — E039 Hypothyroidism, unspecified: Secondary | ICD-10-CM | POA: Diagnosis not present

## 2016-09-28 DIAGNOSIS — I10 Essential (primary) hypertension: Secondary | ICD-10-CM | POA: Diagnosis not present

## 2016-09-28 LAB — CBC WITH DIFFERENTIAL/PLATELET
BASOS ABS: 0.1 10*3/uL (ref 0.0–0.1)
BASOS PCT: 1 %
EOS ABS: 0.2 10*3/uL (ref 0.0–0.7)
Eosinophils Relative: 2 %
HCT: 40.7 % (ref 36.0–46.0)
Hemoglobin: 13.7 g/dL (ref 12.0–15.0)
Lymphocytes Relative: 23 %
Lymphs Abs: 2.7 10*3/uL (ref 0.7–4.0)
MCH: 32.2 pg (ref 26.0–34.0)
MCHC: 33.7 g/dL (ref 30.0–36.0)
MCV: 95.8 fL (ref 78.0–100.0)
MONO ABS: 0.8 10*3/uL (ref 0.1–1.0)
MONOS PCT: 7 %
NEUTROS ABS: 7.8 10*3/uL — AB (ref 1.7–7.7)
NEUTROS PCT: 67 %
Platelets: 301 10*3/uL (ref 150–400)
RBC: 4.25 MIL/uL (ref 3.87–5.11)
RDW: 13.5 % (ref 11.5–15.5)
WBC: 11.5 10*3/uL — ABNORMAL HIGH (ref 4.0–10.5)

## 2016-09-28 LAB — COMPREHENSIVE METABOLIC PANEL
ALBUMIN: 3.8 g/dL (ref 3.5–5.0)
ALK PHOS: 62 U/L (ref 38–126)
ALT: 9 U/L — ABNORMAL LOW (ref 14–54)
ANION GAP: 8 (ref 5–15)
AST: 15 U/L (ref 15–41)
BILIRUBIN TOTAL: 0.5 mg/dL (ref 0.3–1.2)
BUN: 18 mg/dL (ref 6–20)
CALCIUM: 9 mg/dL (ref 8.9–10.3)
CO2: 26 mmol/L (ref 22–32)
Chloride: 104 mmol/L (ref 101–111)
Creatinine, Ser: 0.95 mg/dL (ref 0.44–1.00)
GFR calc Af Amer: >60 mL/min (ref >60)
GFR, EST NON AFRICAN AMERICAN: 58 mL/min — AB (ref >60)
GLUCOSE: 103 mg/dL — AB (ref 65–99)
POTASSIUM: 3.5 mmol/L (ref 3.5–5.1)
Sodium: 138 mmol/L (ref 135–145)
TOTAL PROTEIN: 7.2 g/dL (ref 6.5–8.1)

## 2016-09-28 LAB — LIPASE, BLOOD: Lipase: 37 U/L (ref 11–51)

## 2016-09-28 MED ORDER — ONDANSETRON 4 MG PO TBDP: 4.0000 mg | ORAL_TABLET | Freq: Three times a day (TID) | ORAL | 0 refills | Status: DC | PRN

## 2016-09-28 MED ORDER — SODIUM CHLORIDE 0.9 % IV BOLUS (SEPSIS)
1000.0000 mL | Freq: Once | INTRAVENOUS | Status: AC
  Administered 2016-09-28: 1000 mL via INTRAVENOUS

## 2016-09-28 MED ORDER — ACETAMINOPHEN 500 MG PO TABS: 1000.0000 mg | ORAL_TABLET | Freq: Once | ORAL | Status: DC

## 2016-09-28 MED ORDER — HYDROCODONE-ACETAMINOPHEN 5-325 MG PO TABS
1.0000 | ORAL_TABLET | Freq: Once | ORAL | Status: AC
  Administered 2016-09-28: 1 via ORAL
  Filled 2016-09-28: qty 1

## 2016-09-28 NOTE — ED Provider Notes (Signed)
Emergency Department Provider Note   I have reviewed the triage vital signs and the nursing notes.   HISTORY  Chief Complaint Headache   HPI Maria Blackwell is a 76 y.o. female with PHM of HTN, chronic pain, depression, and know right PICA aneurysm with recent angio presents to the emergency department for evaluation of new-onset nausea, generalized weakness, vomiting. Patient reports symptom onset last night. She developed a mild frontal headache that gradually worsened over the course of the last 12 hours. It radiates slightly to the top of her head. No sudden onset, maximal intensity headache. No neck discomfort. No fevers or chills. No sick contacts. The patient states that she's had similar, intermittent headaches for the past year. She took Motrin this morning which did not improve her headache symptoms. She's had multiple episodes of nonbloody emesis. Denies shortness of breath or coughing. No abdominal discomfort. No diarrhea.    Past Medical History:  Diagnosis Date  . Depression with anxiety   . Hypertension   . Hypothyroid     Patient Active Problem List   Diagnosis Date Noted  . CVA (cerebral infarction) 01/03/2012  . Headache(784.0) 01/03/2012  . Vision changes 01/03/2012  . Hypertension     Past Surgical History:  Procedure Laterality Date  . CHOLECYSTECTOMY    . IR ANGIO INTRA EXTRACRAN SEL COM CAROTID INNOMINATE BILAT MOD SED  09/19/2016  . IR ANGIO VERTEBRAL SEL VERTEBRAL UNI L MOD SED  09/19/2016  . IR US GUIDE VASC ACCESS RIGHT  09/19/2016  . TUMOR REMOVAL      Current Outpatient Rx  . Order #: [id number] Class: Historical Med  . Order #: [id number] Class: Historical Med  . Order #: [id number] Class: OTC  . Order #: [id number] Class: Historical Med  . Order #: [id number] Class: Historical Med  . Order #: [id number] Class: Historical Med  . Order #: [id number] Class: Historical Med  . Order #: [id number] Class: Historical Med  . Order #: [id number] Class: Historical  Med  . Order #: [id number] Class: Historical Med  . Order #: [id number] Class: Historical Med  . Order #: [id number] Class: Historical Med  . Order #: [id number] Class: Historical Med  . Order #: [id number] Class: Historical Med  . Order #: [id number] Class: Historical Med  . Order #: [id number] Class: Print    Allergies Amoxicillin  Family History  Problem Relation Age of Onset  . Cancer Mother   . Cancer Father     Social History Social History  Substance Use Topics  . Smoking status: Never Smoker  . Smokeless tobacco: Never Used  . Alcohol use No    Review of Systems  Constitutional: No fever/chills. Positive generalized weakness.  Eyes: No visual changes. ENT: No sore throat. Cardiovascular: Denies chest pain. Respiratory: Denies shortness of breath. Gastrointestinal: No abdominal pain. Positive nausea and vomiting.  No diarrhea.  No constipation. Genitourinary: Negative for dysuria. Musculoskeletal: Negative for back pain. Skin: Negative for rash. Neurological: Negative for focal weakness or numbness. Positive HA.   10-point ROS otherwise negative.  ____________________________________________   PHYSICAL EXAM:  VITAL SIGNS: ED Triage Vitals  Enc Vitals Group     BP 09/28/16 1833 139/84     Pulse Rate 09/28/16 1834 91     Resp 09/28/16 1838 15     Temp 09/28/16 1840 (!) 97.5 F (36.4 C)     Temp Source 09/28/16 1840 Oral     SpO2 09/28/16 1834 95 %     Weight 09/28/16 1834 156 lb (70.8 kg)     Height 09/28/16 1834 5'  4" (1.626 m)     Pain Score 09/28/16 1834 7   Constitutional: Alert and oriented. Well appearing and in no acute distress. Eyes: Conjunctivae are normal. PERRL.  Head: Atraumatic. Nose: No congestion/rhinnorhea. Mouth/Throat: Mucous membranes are dry.  Neck: No stridor.  No meningeal signs. No cervical spine tenderness to palpation. Cardiovascular: Normal rate, regular rhythm. Good peripheral circulation. Grossly normal heart sounds.   Respiratory:  Normal respiratory effort.  No retractions. Lungs CTAB. Gastrointestinal: Soft and nontender. No distention.  Musculoskeletal: No lower extremity tenderness nor edema. No gross deformities of extremities. Neurologic:  Normal speech and language. No gross focal neurologic deficits are appreciated.  Skin:  Skin is warm, dry and intact. No rash noted. Psychiatric: Mood and affect are normal. Speech and behavior are normal.  ____________________________________________   LABS (all labs ordered are listed, but only abnormal results are displayed)  Labs Reviewed  COMPREHENSIVE METABOLIC PANEL - Abnormal; Notable for the following:       Result Value   Glucose, Bld 103 (*)    ALT 9 (*)    GFR calc non Af Amer 58 (*)    All other components within normal limits  CBC WITH DIFFERENTIAL/PLATELET - Abnormal; Notable for the following:    WBC 11.5 (*)    Neutro Abs 7.8 (*)    All other components within normal limits  LIPASE, BLOOD   ____________________________________________  RADIOLOGY  Ct Head Wo Contrast  Result Date: 09/28/2016 CLINICAL DATA:  76 year old with chronic headaches, known 4 mm right posterior communicating artery aneurysm, presenting now with acute onset of nausea and vomiting last night. EXAM: CT HEAD WITHOUT CONTRAST TECHNIQUE: Contiguous axial images were obtained from the base of the skull through the vertex without intravenous contrast. COMPARISON:  CTA head and neck 11/29/2015. CT head 07/22/2013 and earlier. MRI brain 01/02/2012. FINDINGS: Brain: Ventricular system normal in size and appearance for age. Mild-to-moderate changes of small vessel disease of the white matter diffusely, slightly progressive since 2015, unchanged since last year. No mass lesion. No midline shift. No acute hemorrhage or hematoma. No extra-axial fluid collections. No evidence of acute infarction. Vascular: Severe bilateral carotid siphon atherosclerosis. Atherosclerosis involving the basilar  artery. Skull: No skull fracture or other focal osseous abnormality involving the skull. Sinuses/Orbits: Visualized paranasal sinuses, bilateral mastoid air cells and bilateral middle ear cavities well-aerated. Visualized orbits and globes are normal. Other: None. IMPRESSION: 1. No acute intracranial abnormality. 2. 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 Electronically Signed   By: Hulan Saashomas  Lawrence M.D.   On: 09/28/2016 20:09    ____________________________________________   PROCEDURES  Procedure(s) performed:   Procedures  None ____________________________________________   INITIAL IMPRESSION / ASSESSMENT AND PLAN / ED COURSE  Pertinent labs & imaging results that were available during my care of the patient were reviewed by me and considered in my medical decision making (see chart for details).  Patient presents to the emergency room in for evaluation generalized weakness, nausea, vomiting, mild headache similar to past headaches. Patient does have history of PICA aneurysm currently under surveillance by neurosurgery. On 710 and she had angio. Patient has no sudden onset maximal intensity headache to raise my suspicion for sentinel bleed from aneurysm. She describes a gradually worsening headache throughout the day that is similar to prior headaches she has had over the last year. She has no focal neurological deficits. No neck discomfort or fever. Suspect tension headache in the setting of possible viral infection. No focal abdominal tenderness  to necessitate imaging of the abdomen. Plan for IV fluids, nausea control, labs, and UA. We'll obtain screening CT head to evaluate for any gross bleeding but very low clinical suspicion would not consider LP if negative.   CT negative. Patient is feeling much better after IVF and home Hydrocodone. Labs are unremarkable. Patient tolerating PO. Asking for Hydrocodone refill but referred her to PCP for opiate medication  refills. Zofran prescribed for nausea at home. Suspect viral illness.   At this time, I do not feel there is any life-threatening condition present. I have reviewed and discussed all results (EKG, imaging, lab, urine as appropriate), exam findings with patient. I have reviewed nursing notes and appropriate previous records.  I feel the patient is safe to be discharged home without further emergent workup. Discussed usual and customary return precautions. Patient and family (if present) verbalize understanding and are comfortable with this plan.  Patient will follow-up with their primary care provider. If they do not have a primary care provider, information for follow-up has been provided to them. All questions have been answered.  ____________________________________________  FINAL CLINICAL IMPRESSION(S) / ED DIAGNOSES  Final diagnoses:  Non-intractable vomiting with nausea, unspecified vomiting type  Acute non intractable tension-type headache     MEDICATIONS GIVEN DURING THIS VISIT:  Medications  sodium chloride 0.9 % bolus 1,000 mL (0 mLs Intravenous Stopped 09/28/16 2133)  HYDROcodone-acetaminophen (NORCO/VICODIN) 5-325 MG per tablet 1 tablet (1 tablet Oral Given 09/28/16 1912)     NEW OUTPATIENT MEDICATIONS STARTED DURING THIS VISIT:  New Prescriptions   ONDANSETRON (ZOFRAN ODT) 4 MG DISINTEGRATING TABLET    Take 1 tablet (4 mg total) by mouth every 8 (eight) hours as needed for nausea or vomiting.      Note:  This document was prepared using Dragon voice recognition software and may include unintentional dictation errors.  Alona Bene, MD Emergency Medicine   Anais Koenen, Arlyss Repress, MD 09/28/16 2201

## 2016-09-28 NOTE — Discharge Instructions (Signed)

## 2016-09-28 NOTE — ED Triage Notes (Signed)
Pt with HA x 1 year, hx of aneurysm. Recent cerebral angiogram on 09/19/16.  Pt c/o N/V since last night. 4mg  zofrana given IV en route.  Pt on O2 PRN at home, on 2 L/M currently.  Gen. Weakness and no energy.

## 2017-02-27 ENCOUNTER — Emergency Department (HOSPITAL_COMMUNITY)
Admission: EM | Admit: 2017-02-27 | Discharge: 2017-02-27 | Disposition: A | Discharge status: Home / Self Care | Payer: Medicare Other | Attending: Emergency Medicine | Admitting: Emergency Medicine

## 2017-02-27 ENCOUNTER — Emergency Department (HOSPITAL_COMMUNITY): Payer: Medicare Other

## 2017-02-27 ENCOUNTER — Encounter (HOSPITAL_COMMUNITY): Payer: Self-pay | Admitting: Emergency Medicine

## 2017-02-27 ENCOUNTER — Other Ambulatory Visit: Payer: Self-pay

## 2017-02-27 DIAGNOSIS — Z5321 Procedure and treatment not carried out due to patient leaving prior to being seen by health care provider: Secondary | ICD-10-CM | POA: Insufficient documentation

## 2017-02-27 DIAGNOSIS — R110 Nausea: Secondary | ICD-10-CM | POA: Diagnosis not present

## 2017-02-27 DIAGNOSIS — R51 Headache: Secondary | ICD-10-CM | POA: Diagnosis not present

## 2017-02-27 LAB — DIFFERENTIAL
BASOS ABS: 0.1 10*3/uL (ref 0.0–0.1)
BASOS PCT: 1 %
EOS ABS: 0.3 10*3/uL (ref 0.0–0.7)
Eosinophils Relative: 3 %
Lymphocytes Relative: 26 %
Lymphs Abs: 2.8 10*3/uL (ref 0.7–4.0)
MONOS PCT: 7 %
Monocytes Absolute: 0.8 10*3/uL (ref 0.1–1.0)
NEUTROS ABS: 7.1 10*3/uL (ref 1.7–7.7)
NEUTROS PCT: 63 %

## 2017-02-27 LAB — I-STAT TROPONIN, ED: TROPONIN I, POC: 0 ng/mL (ref 0.00–0.08)

## 2017-02-27 LAB — CBC
HEMATOCRIT: 44.8 % (ref 36.0–46.0)
Hemoglobin: 15.3 g/dL — ABNORMAL HIGH (ref 12.0–15.0)
MCH: 32.1 pg (ref 26.0–34.0)
MCHC: 34.2 g/dL (ref 30.0–36.0)
MCV: 93.9 fL (ref 78.0–100.0)
PLATELETS: 283 10*3/uL (ref 150–400)
RBC: 4.77 MIL/uL (ref 3.87–5.11)
RDW: 13 % (ref 11.5–15.5)
WBC: 11.1 10*3/uL — AB (ref 4.0–10.5)

## 2017-02-27 LAB — APTT: APTT: 38 s — AB (ref 24–36)

## 2017-02-27 LAB — I-STAT CHEM 8, ED
BUN: 21 mg/dL — ABNORMAL HIGH (ref 6–20)
CHLORIDE: 99 mmol/L — AB (ref 101–111)
CREATININE: 0.9 mg/dL (ref 0.44–1.00)
Calcium, Ion: 1.22 mmol/L (ref 1.15–1.40)
Glucose, Bld: 105 mg/dL — ABNORMAL HIGH (ref 65–99)
HEMATOCRIT: 49 % — AB (ref 36.0–46.0)
HEMOGLOBIN: 16.7 g/dL — AB (ref 12.0–15.0)
POTASSIUM: 3.3 mmol/L — AB (ref 3.5–5.1)
Sodium: 140 mmol/L (ref 135–145)
TCO2: 26 mmol/L (ref 22–32)

## 2017-02-27 LAB — COMPREHENSIVE METABOLIC PANEL
ALT: 12 U/L — ABNORMAL LOW (ref 14–54)
AST: 19 U/L (ref 15–41)
Albumin: 4 g/dL (ref 3.5–5.0)
Alkaline Phosphatase: 78 U/L (ref 38–126)
Anion gap: 9 (ref 5–15)
BUN: 19 mg/dL (ref 6–20)
CHLORIDE: 101 mmol/L (ref 101–111)
CO2: 26 mmol/L (ref 22–32)
Calcium: 9.8 mg/dL (ref 8.9–10.3)
Creatinine, Ser: 0.94 mg/dL (ref 0.44–1.00)
GFR calc Af Amer: >60 mL/min (ref >60)
GFR, EST NON AFRICAN AMERICAN: 58 mL/min — AB (ref >60)
Glucose, Bld: 103 mg/dL — ABNORMAL HIGH (ref 65–99)
POTASSIUM: 3.1 mmol/L — AB (ref 3.5–5.1)
SODIUM: 136 mmol/L (ref 135–145)
Total Bilirubin: 0.6 mg/dL (ref 0.3–1.2)
Total Protein: 7.3 g/dL (ref 6.5–8.1)

## 2017-02-27 LAB — PROTIME-INR
INR: 0.93
PROTHROMBIN TIME: 12.4 s (ref 11.4–15.2)

## 2017-02-27 NOTE — ED Triage Notes (Signed)
Pt c/o headache, hx of aneurysm, has also c/o shakiness/nausea-- was told by neurologist to come here to be evaluated. Headache is right side of head, has only eaten 2 nabs today-- denies any diarrhea.

## 2017-02-27 NOTE — ED Notes (Signed)
Patient's daughter updated on delays and wait time

## 2017-02-27 NOTE — ED Notes (Signed)
Patient's daughter back up to desk.  Asking if this RN can review CT results.  This RN stated she could not.  States she is going to take her mother home.  Will d/c.

## 2017-06-25 ENCOUNTER — Encounter: Payer: Self-pay | Admitting: Gastroenterology

## 2017-09-20 ENCOUNTER — Ambulatory Visit: Payer: Medicare Other | Admitting: Gastroenterology

## 2017-09-20 ENCOUNTER — Encounter: Payer: Self-pay | Admitting: Gastroenterology

## 2017-09-20 ENCOUNTER — Encounter

## 2017-09-20 ENCOUNTER — Telehealth: Payer: Self-pay | Admitting: Gastroenterology

## 2017-09-20 NOTE — Telephone Encounter (Signed)
Patient was a no show and letter sent  °

## 2018-06-06 ENCOUNTER — Encounter: Payer: Self-pay | Admitting: Gastroenterology

## 2018-09-09 ENCOUNTER — Encounter: Payer: Self-pay | Admitting: Nurse Practitioner

## 2018-09-09 ENCOUNTER — Ambulatory Visit (INDEPENDENT_AMBULATORY_CARE_PROVIDER_SITE_OTHER): Payer: Medicare Other | Admitting: Nurse Practitioner

## 2018-09-09 ENCOUNTER — Other Ambulatory Visit: Payer: Self-pay

## 2018-09-09 DIAGNOSIS — R109 Unspecified abdominal pain: Secondary | ICD-10-CM | POA: Insufficient documentation

## 2018-09-09 DIAGNOSIS — K5900 Constipation, unspecified: Secondary | ICD-10-CM | POA: Diagnosis not present

## 2018-09-09 DIAGNOSIS — R1030 Lower abdominal pain, unspecified: Secondary | ICD-10-CM | POA: Diagnosis not present

## 2018-09-09 NOTE — Assessment & Plan Note (Signed)
32 describes lower abdominal pain in addition to her constipation.  It is described as "stabbing."  She states she is having bladder issues as well and is previously required some sort of "bladder dilation" in the past.  She is requesting referral to urology.  I recommended she follow-up with primary care related to this.  She is on Linzess 145 mcg.  As per above I will increase this to 290 mcg.  Of note, her abdominal pain typically resolves after she does have a bowel movement.  If Linzess 290 mcg does not work we can add fiber or consider switching to Amitiza or Movantik for OIC possibility.  Follow-up in 2 months.

## 2018-09-09 NOTE — Progress Notes (Signed)
cc'ed to pcp °

## 2018-09-09 NOTE — Assessment & Plan Note (Signed)
The patient describes significant constipation at this point.  She is on daily pain medicine but not sure if her constipation is due to opioids.  She was previously on Linzess 145 mcg daily.  This is not working for her and she has a bowel movement about every 4 to 5 days.  She does straining and hard stools.  Denies hematochezia.  No other red flag/warning signs or symptoms.  At this point I will give her samples of increased dose of Linzess 290 mcg and see if this works any better.  Samples for 1 to 2 weeks and request Proctocort 1 to 2 weeks.  Follow-up in 2 months.

## 2018-09-09 NOTE — Patient Instructions (Signed)
Your health issues we discussed today were:   Constipation with abdominal pain: 1. Abdominal pain can absolutely be caused by constipation 2. I am giving you samples of Linzess 290 mcg to try 3. Call us in 1 to 2 weeks and let us know if this is helping at all 4. You can also add a fiber supplement such as Benefiber, fiber capsules, or fiber Gummies help increase the amount of fiber and help have better bowel movements. 5. Depending on how you do with the higher dose of Linzess, we may recommend other medications to try to get your constipation under better control 6. I feel your abdominal pain will improve after we can improve your constipation 7. Call if you have any severe worsening symptoms  Overall I recommend:  1. Continue taking your other medications 2. Follow-up in 2 months 3. Call us if you have any questions or concerns.   Because of recent events of COVID-19 ("Coronavirus"), follow CDC recommendations:  1. Wash your hand frequently 2. Avoid touching your face 3. Stay away from people who are sick 4. If you have symptoms such as fever, cough, shortness of breath then call your healthcare provider for further guidance 5. If you are sick, STAY AT HOME unless otherwise directed by your healthcare provider. 6. Follow directions from state and national officials regarding staying safe   At [facility] we value your feedback. You may receive a survey about your visit today. Please share your experience as we strive to create trusting relationships with our patients to provide genuine, compassionate, quality care.  We appreciate your understanding and patience as we review any laboratory studies, imaging, and other diagnostic tests that are ordered as we care for you. Our office policy is 5 business days for review of these results, and any emergent or urgent results are addressed in a timely manner for your best interest. If you do not hear from our office in 1 week,  please contact us.   We also encourage the use of MyChart, which contains your medical information for your review as well. If you are not enrolled in this feature, an access code is on this after visit summary for your convenience. Thank you for allowing [place] to be involved in your care.  It was great to see you today!  I hope you have a great summer!!

## 2018-09-09 NOTE — Progress Notes (Signed)
Primary Care Physician:  Octavio Graves, DO Primary Gastroenterologist:  Dr. Oneida Alar  Chief Complaint  Patient presents with  . Abdominal Pain    lower abd  . Constipation    HPI:   Maria Blackwell is a 78 y.o. female who presents on referral from primary care for abdominal pain and constipation.  Reviewed information provided with the referral including office visit dated 05/20/2018.  At that time noted abdominal discomfort in the right lower quadrant.  Recommended Linzess at 290 mcg dose and if not helpful will consider CT of the abdomen with contrast.  No history of colonoscopy or endoscopy in our system.  Today she states she is doing okay overall.  She appears to be on Linzess 145 mcg daily. Has a bowel movement about every 4-5 days. Hard stools, a lot of straining required. States she knows the stool is there but can't get it out. Drinks a lot of water, has used prune juice and OTC laxatives. Eats "vegetables all the time but don't like fruit much" but admits she only eats them once a day. Also with lower abdominal pain described as stabbing. Pain improves after a bowel movement. Has issues with her bladder and has had to be "dilated before and better after that" and wonders if this needs to be done again. Denies hematochezia, melena, fever, chills, unintentional weight loss. Previously had hemorrhoids which are not a problem currently. Denies URI or flu-like symptoms. Denies loss of sense of taste or smell. Denies chest pain, dyspnea, dizziness, lightheadedness, syncope, near syncope. Denies any other upper or lower GI symptoms.  States last colonoscopy was in the early 80's. None since then.  Past Medical History:  Diagnosis Date  . Depression with anxiety   . Hypertension   . Hypothyroid     Past Surgical History:  Procedure Laterality Date  . CHOLECYSTECTOMY    . IR ANGIO INTRA EXTRACRAN SEL COM CAROTID INNOMINATE BILAT MOD SED  09/19/2016  . IR ANGIO VERTEBRAL SEL  VERTEBRAL UNI L MOD SED  09/19/2016  . IR US GUIDE VASC ACCESS RIGHT  09/19/2016  . TUMOR REMOVAL      Current Outpatient Medications  Medication Sig Dispense Refill  . amLODipine (NORVASC) 10 MG tablet Take 10 mg by mouth daily.     Marland Kitchen aspirin 325 MG tablet Take 1 tablet (325 mg total) by mouth daily.    . Calcium Carb-Cholecalciferol (CALCIUM 600 + D PO) Take 1 tablet by mouth 2 (two) times daily.    Marland Kitchen HYDROcodone-acetaminophen (NORCO/VICODIN) 5-325 MG tablet Take 1 tablet by mouth 3 (three) times daily.    Marland Kitchen ibuprofen (ADVIL,MOTRIN) 200 MG tablet Take 200 mg by mouth every 6 (six) hours as needed.    Marland Kitchen levothyroxine (SYNTHROID, LEVOTHROID) 100 MCG tablet Take 100 mcg by mouth daily.    Marland Kitchen linaclotide (LINZESS) 145 MCG CAPS capsule Take 145 mcg by mouth daily before breakfast.    . OXYGEN Inhale 2 L into the lungs at bedtime.    . triamterene-hydrochlorothiazide (DYAZIDE) 37.5-25 MG per capsule Take 1 capsule by mouth daily.     No current facility-administered medications for this visit.     Allergies as of 09/09/2018 - Review Complete 09/09/2018  Allergen Reaction Noted  . Amoxicillin Rash 01/02/2012    Family History  Problem Relation Age of Onset  . Cancer Mother   . Cancer Father   . Colon cancer Neg Hx     Social History   Socioeconomic History  .  Marital status: Widowed    Spouse name: Not on file  . Number of children: Not on file  . Years of education: Not on file  . Highest education level: Not on file  Occupational History  . Not on file  Social Needs  . Financial resource strain: Not on file  . Food insecurity    Worry: Not on file    Inability: Not on file  . Transportation needs    Medical: Not on file    Non-medical: Not on file  Tobacco Use  . Smoking status: Current Every Day Smoker    Packs/day: 1.00    Types: Cigarettes  . Smokeless tobacco: Never Used  Substance and Sexual Activity  . Alcohol use: No  . Drug use: No  . Sexual activity: Not  on file  Lifestyle  . Physical activity    Days per week: Not on file    Minutes per session: Not on file  . Stress: Not on file  Relationships  . Social Musicianconnections    Talks on phone: Not on file    Gets together: Not on file    Attends religious service: Not on file    Active member of club or organization: Not on file    Attends meetings of clubs or organizations: Not on file    Relationship status: Not on file  . Intimate partner violence    Fear of current or ex partner: Not on file    Emotionally abused: Not on file    Physically abused: Not on file    Forced sexual activity: Not on file  Other Topics Concern  . Not on file  Social History Narrative   Patient lives at home alone. Is a widow  has 3 children and a high school education.   Patient smokes 1 pack of cigarettes and drinks 1-2 cups of coffee.     Review of Systems: Complete ROS negative except as per HPI.    Physical Exam: BP (!) 142/99   Pulse 86   Temp (!) 97.1 F (36.2 C) (Oral)   Ht 5\' 4"  (1.626 m)   Wt 147 lb (66.7 kg)   BMI 25.23 kg/m  General:   Alert and oriented. Pleasant and cooperative. Well-nourished and well-developed.  Head:  Normocephalic and atraumatic. Eyes:  Without icterus, sclera clear and conjunctiva pink.  Ears:  Normal auditory acuity. Cardiovascular:  S1, S2 present without murmurs appreciated. Extremities without clubbing or edema. Respiratory:  Clear to auscultation bilaterally. No wheezes, rales, or rhonchi. No distress.  Gastrointestinal:  +BS, soft, and non-distended. No worsening abdominal TTP noted. No HSM noted. No guarding or rebound. No masses appreciated.  Rectal:  Deferred  Musculoskalatal:  Symmetrical without gross deformities. Skin:  Intact without significant lesions or rashes. Neurologic:  Alert and oriented x4;  grossly normal neurologically. Psych:  Alert and cooperative. Normal mood and affect. Heme/Lymph/Immune: No excessive bruising noted.     09/09/2018 11:33 AM   Disclaimer: This note was dictated with voice recognition software. Similar sounding words can inadvertently be transcribed and may not be corrected upon review.

## 2018-10-07 NOTE — Progress Notes (Signed)
REVIEWED-NO ADDITIONAL RECOMMENDATIONS. 

## 2018-10-17 ENCOUNTER — Other Ambulatory Visit: Payer: Self-pay

## 2018-10-17 ENCOUNTER — Ambulatory Visit (INDEPENDENT_AMBULATORY_CARE_PROVIDER_SITE_OTHER): Payer: Medicare Other | Admitting: Neurology

## 2018-10-17 ENCOUNTER — Encounter: Payer: Self-pay | Admitting: Neurology

## 2018-10-17 VITALS — BP 164/103 | HR 90 | Temp 98.0°F | Ht 64.0 in | Wt 141.0 lb

## 2018-10-17 DIAGNOSIS — R269 Unspecified abnormalities of gait and mobility: Secondary | ICD-10-CM

## 2018-10-17 DIAGNOSIS — M542 Cervicalgia: Secondary | ICD-10-CM | POA: Diagnosis not present

## 2018-10-17 DIAGNOSIS — R32 Unspecified urinary incontinence: Secondary | ICD-10-CM

## 2018-10-17 DIAGNOSIS — G3281 Cerebellar ataxia in diseases classified elsewhere: Secondary | ICD-10-CM

## 2018-10-17 DIAGNOSIS — M5442 Lumbago with sciatica, left side: Secondary | ICD-10-CM | POA: Diagnosis not present

## 2018-10-17 NOTE — Progress Notes (Signed)
PATIENT: Maria Blackwell DOB: [birth year]  Chief Complaint  Patient presents with  . New Patient (Initial Visit)     Paper Referral for amnesia and gait and walking issues room 4 pt with daughter [name]  and her temp is 98.0     HISTORICAL  Maria Blackwell is a 78 year old female, seen in request by her primary care PA [place], Darci CurrentVincent J for evaluation of gait difficulty, initial evaluation was on October 17, 2018.  She is accompanied by her daughter [name] at today's visit  I have reviewed and summarized the referring note from the referring physician.  She has past medical history of hypertension, hypothyroidism, on supplement, she was at her baseline at the beginning of 2020, had gradual onset but rapid decline of gait abnormality, she fell frequently, both leg give out underneath her, both arms were shaky, ambulate use of a walker, about the same time, she began to have worsening urinary urgency, incontinence, she does complains of low back pain, radiating pain to left hip, there was no significant bilateral lower extremity paresthesia, he has mild neck pain   REVIEW OF SYSTEMS: Full 14 system review of systems performed and notable only for as above All other review of systems were negative.  ALLERGIES: Allergies  Allergen Reactions  . Amoxicillin Rash    HOME MEDICATIONS: Current Outpatient Medications  Medication Sig Dispense Refill  . aspirin 325 MG tablet Take 1 tablet (325 mg total) by mouth daily.    . Calcium Carb-Cholecalciferol (CALCIUM 600 + D PO) Take 1 tablet by mouth 2 (two) times daily.    Marland Kitchen. gabapentin (NEURONTIN) 300 MG capsule Take by mouth.    Marland Kitchen. HYDROcodone-acetaminophen (NORCO/VICODIN) 5-325 MG tablet Take 1 tablet by mouth 3 (three) times daily.    Marland Kitchen. levothyroxine (SYNTHROID) 112 MCG tablet     . linaclotide (LINZESS) 145 MCG CAPS capsule Take 145 mcg by mouth daily before breakfast.    . omeprazole (PRILOSEC) 20 MG capsule     . OXYGEN Inhale 2 L into  the lungs at bedtime.    Marland Kitchen. PARoxetine (PAXIL) 20 MG tablet Take by mouth.    . triamterene-hydrochlorothiazide (DYAZIDE) 37.5-25 MG per capsule Take 1 capsule by mouth daily.     No current facility-administered medications for this visit.     PAST MEDICAL HISTORY: Past Medical History:  Diagnosis Date  . Depression with anxiety   . Hypertension   . Hypothyroid     PAST SURGICAL HISTORY: Past Surgical History:  Procedure Laterality Date  . CHOLECYSTECTOMY    . IR ANGIO INTRA EXTRACRAN SEL COM CAROTID INNOMINATE BILAT MOD SED  09/19/2016  . IR ANGIO VERTEBRAL SEL VERTEBRAL UNI L MOD SED  09/19/2016  . IR US GUIDE VASC ACCESS RIGHT  09/19/2016  . TUMOR REMOVAL      FAMILY HISTORY: Family History  Problem Relation Age of Onset  . Cancer Mother   . Cancer Father   . Colon cancer Neg Hx     SOCIAL HISTORY: Social History   Socioeconomic History  . Marital status: Widowed    Spouse name: Not on file  . Number of children: Not on file  . Years of education: Not on file  . Highest education level: Not on file  Occupational History  . Not on file  Social Needs  . Financial resource strain: Not on file  . Food insecurity    Worry: Not on file    Inability: Not on file  .  Transportation needs    Medical: Not on file    Non-medical: Not on file  Tobacco Use  . Smoking status: Current Every Day Smoker    Packs/day: 1.00    Types: Cigarettes  . Smokeless tobacco: Never Used  Substance and Sexual Activity  . Alcohol use: No  . Drug use: No  . Sexual activity: Not on file  Lifestyle  . Physical activity    Days per week: Not on file    Minutes per session: Not on file  . Stress: Not on file  Relationships  . Social Musicianconnections    Talks on phone: Not on file    Gets together: Not on file    Attends religious service: Not on file    Active member of club or organization: Not on file    Attends meetings of clubs or organizations: Not on file    Relationship status:  Not on file  . Intimate partner violence    Fear of current or ex partner: Not on file    Emotionally abused: Not on file    Physically abused: Not on file    Forced sexual activity: Not on file  Other Topics Concern  . Not on file  Social History Narrative   Patient lives at home alone. Is a widow  has 3 children and a high school education.   Patient smokes 1 pack of cigarettes and drinks 1-2 cups of coffee.      PHYSICAL EXAM   Vitals:   10/17/18 1539  BP: (!) 164/103  Pulse: 90  Temp: 98 F (36.7 C)  Weight: 141 lb (64 kg)  Height: 5\' 4"  (1.626 m)    Not recorded      Body mass index is 24.2 kg/m.  PHYSICAL EXAMNIATION:  Gen: NAD, conversant, well nourised, obese, well groomed                     Cardiovascular: Regular rate rhythm, no peripheral edema, warm, nontender. Eyes: Conjunctivae clear without exudates or hemorrhage Neck: Supple, no carotid bruits. Pulmonary: Clear to auscultation bilaterally   NEUROLOGICAL EXAM:  MENTAL STATUS: Speech:    Speech is normal; fluent and spontaneous with normal comprehension.  Cognition:     Orientation to time, place and person     Normal recent and remote memory     Normal Attention span and concentration     Normal Language, naming, repeating,spontaneous speech     Fund of knowledge   CRANIAL NERVES: CN II: Visual fields are full to confrontation.  Pupils are round equal and briskly reactive to light. CN III, IV, VI: extraocular movement are normal. No ptosis. CN V: Facial sensation is intact to pinprick in all 3 divisions bilaterally. Corneal responses are intact.  CN VII: Face is symmetric with normal eye closure and smile. CN VIII: Hearing is normal to rubbing fingers CN IX, X: Palate elevates symmetrically. Phonation is normal. CN XI: Head turning and shoulder shrug are intact CN XII: Tongue is midline with normal movements and no atrophy.  MOTOR: Left arm pronation drift, fixation on rapid rotating  movement, mild bilateral toe extension weakness  REFLEXES: Hyperreflexia on the left upper, and lower extremities, left side Babinski signs,  SENSORY: Length dependent decreased to light touch, pinprick, vibratory sensation to ankle level  COORDINATION: She has truncal ataxia, no significant bleeding dysmetria noticed.  GAIT/STANCE: She needs push-up to get up from seated position, wide-based, unsteady, not keep her balance feet together,  did better with her feet apart, difficulty perform tiptoe, heel standing  DIAGNOSTIC DATA (LABS, IMAGING, TESTING) - I reviewed patient records, labs, notes, testing and imaging myself where available.   ASSESSMENT AND PLAN  JASIYA MARKIE is a 78 y.o. female   Subacute onset progressive gait abnormality, worsening urinary incontinence, Low back pain  Left upper extremity weakness, hyperreflexia of left upper and lower extremity, left side Babinski sign, truncal ataxia  Differentiation diagnosis including right hemisphere pathology, brainstem/cerebellum pathology, also need to rule out cervical cord, and lumbar spine stenosis  Proceed with MRI of brain, MRI of lumbar spine, and cervical spine,   Marcial Pacas, M.D. Ph.D.  [facility] [address] [place], [place] [address] Ph: [phone] Fax: [phone]  CC: Traci Sermon, [place]

## 2018-10-22 ENCOUNTER — Telehealth: Payer: Self-pay | Admitting: Neurology

## 2018-10-22 NOTE — Telephone Encounter (Signed)
LVM for pt to call back to see if she would like her MRI at [facility].

## 2018-10-23 ENCOUNTER — Other Ambulatory Visit: Payer: Self-pay

## 2018-10-23 ENCOUNTER — Ambulatory Visit (INDEPENDENT_AMBULATORY_CARE_PROVIDER_SITE_OTHER): Payer: Medicare Other | Admitting: Urology

## 2018-10-23 DIAGNOSIS — R310 Gross hematuria: Secondary | ICD-10-CM

## 2018-10-23 DIAGNOSIS — N3941 Urge incontinence: Secondary | ICD-10-CM | POA: Diagnosis not present

## 2018-10-23 NOTE — Telephone Encounter (Signed)
faxed orders to [facility] bc they will maybe able to get her in before 11/12/18.

## 2018-10-24 ENCOUNTER — Other Ambulatory Visit: Payer: Self-pay | Admitting: Urology

## 2018-10-24 ENCOUNTER — Other Ambulatory Visit (HOSPITAL_COMMUNITY): Payer: Self-pay | Admitting: Urology

## 2018-10-24 DIAGNOSIS — R310 Gross hematuria: Secondary | ICD-10-CM

## 2018-11-06 ENCOUNTER — Ambulatory Visit (HOSPITAL_COMMUNITY)
Admission: RE | Admit: 2018-11-06 | Discharge: 2018-11-06 | Disposition: A | Discharge status: Home / Self Care | Payer: Medicare Other | Source: Ambulatory Visit | Attending: Urology | Admitting: Urology

## 2018-11-06 ENCOUNTER — Other Ambulatory Visit: Payer: Self-pay

## 2018-11-06 DIAGNOSIS — R310 Gross hematuria: Secondary | ICD-10-CM | POA: Diagnosis present

## 2018-11-06 LAB — POCT I-STAT CREATININE: Creatinine, Ser: 1.3 mg/dL — ABNORMAL HIGH (ref 0.44–1.00)

## 2018-11-06 MED ORDER — IOHEXOL 300 MG/ML  SOLN
150.0000 mL | Freq: Once | INTRAMUSCULAR | Status: AC | PRN
  Administered 2018-11-06: 96 mL via INTRAVENOUS

## 2018-11-07 ENCOUNTER — Telehealth: Payer: Self-pay | Admitting: Neurology

## 2018-11-07 ENCOUNTER — Ambulatory Visit (HOSPITAL_COMMUNITY)
Admission: RE | Admit: 2018-11-07 | Discharge: 2018-11-07 | Disposition: A | Discharge status: Home / Self Care | Payer: Medicare Other | Source: Ambulatory Visit | Attending: Neurology | Admitting: Neurology

## 2018-11-07 DIAGNOSIS — G3281 Cerebellar ataxia in diseases classified elsewhere: Secondary | ICD-10-CM

## 2018-11-07 NOTE — Telephone Encounter (Signed)
The patient is scheduled for MRI scans today at [facility].  She has pending follow up on 11/12/2018.  Her daughter requested the appt be changed to a virtual visit.  Prior to disconnecting with the phone room, she stated her mother could not be present due to a work conflict.  I called the daughter back and left a detailed message on her voicemail (ok per DPR).  We are unable to complete the follow up without her mother present.  There are also some insurance billing issues with type of request.  I told her we would leave the appt scheduled on 11/12/2018, in case she can work it out to be with her mother.  If not, then I ask her to return the call to our office and we would be happy to reschedule it to another time that would work better for both of them.   I ask her to please call back to confirm the appt or reschedule, if needed.  The patient will need to be present at the appt, even during a virtual visit.

## 2018-11-07 NOTE — Telephone Encounter (Signed)
I spoke to the patient's daughter.  The appt has been moved to a more convenient time for both of them.

## 2018-11-07 NOTE — Telephone Encounter (Signed)
Pt daughter(on DPR) is asking if re: the upcoming mychart visit can it just be her meeting with Dr Krista Blue.  Daughter states she knows all of the pt's symptoms and everything.  Daughter states the main reason for her asking for a virtual visit is because she is unable to get off work to get with pt.  Daughter was told her request would be sent to the RN to call her back to discuss.

## 2018-11-11 ENCOUNTER — Telehealth: Payer: Self-pay | Admitting: Neurology

## 2018-11-11 DIAGNOSIS — G959 Disease of spinal cord, unspecified: Secondary | ICD-10-CM

## 2018-11-11 NOTE — Telephone Encounter (Signed)
Please call patient, MRI of cervical spine showed spinal stenosis at C4-5, C5-6, which might explain her difficulty walking, urinary incontinence, will refer her to neurosurgeon for evaluation,  MRI of brain showed extensive multivessel disease, evidence of old left occipital cortical and subcortical infarction  MRI of lumbar spine showed degenerative changes but no evidence of significant foraminal canal stenosis.    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 Detail is limited due to motion on the axial imaging. Subarachnoid space surrounding the cord is effaced but no abnormal T2 cord signal is seen. Bilateral foraminal stenosis at those levels could affect the C5 and C6 nerves.  Lesser stenosis at C6-7 with AP diameter of the canal measuring 7.4 Mm.  IMPRESSION: 1. Mild lumbar spine spondylosis as described above. 2. Abdominal aortic aneurysm measuring 4.9 cm in greatest diameter. 4.5 - 4.9 cm Abdominal Aortic Aneurysm (ICD10-I71.9). Recommend follow-up CTA of the abdomen and pelvis in 6 months and vascular surgery referral/consultation. This recommendation follows ACR consensus guidelines: White Paper of the ACR Incidental Findings Committee II on Vascular Findings. J Am Coll Radiol 2013; 10:789-794.  IMPRESSION: No acute or reversible finding. Extensive chronic small-vessel ischemic changes throughout the brain as outlined above. 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

## 2018-11-11 NOTE — Telephone Encounter (Signed)
Left message for her daughter, Sheliah Hatch (on [place]).  If she calls back, I will be happy to speak with her.  Otherwise, the patient has an appt tomorrow to review her further her MRI results with Dr. Krista Blue.

## 2018-11-12 ENCOUNTER — Telehealth: Payer: Medicare Other | Admitting: Neurology

## 2018-11-12 ENCOUNTER — Encounter: Payer: Self-pay | Admitting: Neurology

## 2018-11-12 ENCOUNTER — Other Ambulatory Visit: Payer: Self-pay

## 2018-11-12 ENCOUNTER — Ambulatory Visit (INDEPENDENT_AMBULATORY_CARE_PROVIDER_SITE_OTHER): Payer: Medicare Other | Admitting: Neurology

## 2018-11-12 VITALS — BP 151/98 | HR 86 | Temp 97.8°F | Ht 64.0 in | Wt 139.0 lb

## 2018-11-12 DIAGNOSIS — G959 Disease of spinal cord, unspecified: Secondary | ICD-10-CM | POA: Diagnosis not present

## 2018-11-12 DIAGNOSIS — G3281 Cerebellar ataxia in diseases classified elsewhere: Secondary | ICD-10-CM | POA: Diagnosis not present

## 2018-11-12 DIAGNOSIS — R269 Unspecified abnormalities of gait and mobility: Secondary | ICD-10-CM | POA: Diagnosis not present

## 2018-11-12 NOTE — Progress Notes (Signed)
PATIENT: Maria GaleGlynda J Blackwell DOB: 1940/05/06  Chief Complaint  Patient presents with  . Gait Abnormality/Weakness    She is here with her daughter, Eusebio FriendlyCandy Wilson, to review her MRI results.      HISTORICAL  Maria Blackwell is a 78 year old female, seen in request by her primary care PA [place], Darci CurrentVincent J for evaluation of gait difficulty, initial evaluation was on October 17, 2018.  She is accompanied by her daughter [name] at today's visit  I have reviewed and summarized the referring note from the referring physician.  She has past medical history of hypertension, hypothyroidism, on supplement, she was at her baseline at the beginning of 2020, had gradual onset but rapid decline of gait abnormality, she fell frequently, both leg give out underneath her, both arms were shaky, ambulate use of a walker, about the same time, she began to have worsening urinary urgency, incontinence, she does complains of low back pain, radiating pain to left hip, there was no significant bilateral lower extremity paresthesia, she has mild neck pain  UPDATE Sept 1 2020: She is with her daughter at today's clinical visit, she continue complains of slow decline gait abnormality, urinary urgency, incontinence, neck pain, radiating to her spine, she is very sedentary, smoke at least a pack a day  We personally reviewed MRI lumbar spine, mild degenerative changes, 4.9 cm abdominal aortic aneurysm,  MRI cervical spine, cervical spinal stenosis at C4-5, C5-6, with AP diameter measuring 6 mm, motion artifact, subarachnoid space surrounding the port is in place, but no abnormal cord signal is seen   MRI of brain without contrast: No acute abnormality, extensive chronic small vessel ischemic changes,  REVIEW OF SYSTEMS: Full 14 system review of systems performed and notable only for as above All other review of systems were negative.  ALLERGIES: Allergies  Allergen Reactions  . Amoxicillin Rash    HOME  MEDICATIONS: Current Outpatient Medications  Medication Sig Dispense Refill  . aspirin 325 MG tablet Take 1 tablet (325 mg total) by mouth daily.    Marland Kitchen. HYDROcodone-acetaminophen (NORCO/VICODIN) 5-325 MG tablet Take 1 tablet by mouth 3 (three) times daily.    Marland Kitchen. levothyroxine (SYNTHROID) 112 MCG tablet     . omeprazole (PRILOSEC) 20 MG capsule     . OXYGEN Inhale 2 L into the lungs at bedtime.    Marland Kitchen. PARoxetine (PAXIL) 20 MG tablet Take by mouth.    . triamterene-hydrochlorothiazide (DYAZIDE) 37.5-25 MG per capsule Take 1 capsule by mouth daily.    . Vitamin D, Ergocalciferol, (DRISDOL) 1.25 MG (50000 UT) CAPS capsule Take 50,000 Units by mouth 2 (two) times daily.     No current facility-administered medications for this visit.     PAST MEDICAL HISTORY: Past Medical History:  Diagnosis Date  . Depression with anxiety   . Hypertension   . Hypothyroid     PAST SURGICAL HISTORY: Past Surgical History:  Procedure Laterality Date  . CHOLECYSTECTOMY    . IR ANGIO INTRA EXTRACRAN SEL COM CAROTID INNOMINATE BILAT MOD SED  09/19/2016  . IR ANGIO VERTEBRAL SEL VERTEBRAL UNI L MOD SED  09/19/2016  . IR US GUIDE VASC ACCESS RIGHT  09/19/2016  . TUMOR REMOVAL      FAMILY HISTORY: Family History  Problem Relation Age of Onset  . Cancer Mother   . Cancer Father   . Colon cancer Neg Hx     SOCIAL HISTORY: Social History   Socioeconomic History  . Marital status: Widowed  Spouse name: Not on file  . Number of children: Not on file  . Years of education: Not on file  . Highest education level: Not on file  Occupational History  . Not on file  Social Needs  . Financial resource strain: Not on file  . Food insecurity    Worry: Not on file    Inability: Not on file  . Transportation needs    Medical: Not on file    Non-medical: Not on file  Tobacco Use  . Smoking status: Current Every Day Smoker    Packs/day: 1.00    Types: Cigarettes  . Smokeless tobacco: Never Used  Substance  and Sexual Activity  . Alcohol use: No  . Drug use: No  . Sexual activity: Not on file  Lifestyle  . Physical activity    Days per week: Not on file    Minutes per session: Not on file  . Stress: Not on file  Relationships  . Social Herbalist on phone: Not on file    Gets together: Not on file    Attends religious service: Not on file    Active member of club or organization: Not on file    Attends meetings of clubs or organizations: Not on file    Relationship status: Not on file  . Intimate partner violence    Fear of current or ex partner: Not on file    Emotionally abused: Not on file    Physically abused: Not on file    Forced sexual activity: Not on file  Other Topics Concern  . Not on file  Social History Narrative   Patient lives at home alone. Is a widow  has 3 children and a high school education.   Patient smokes 1 pack of cigarettes and drinks 1-2 cups of coffee.      PHYSICAL EXAM   Vitals:   11/12/18 1545  BP: (!) 151/98  Pulse: 86  Temp: 97.8 F (36.6 C)  Weight: 139 lb (63 kg)  Height: 5\' 4"  (1.626 m)    Not recorded      Body mass index is 23.86 kg/m.  PHYSICAL EXAMNIATION:  Gen: NAD, conversant, well nourised, obese, well groomed                     Cardiovascular: Regular rate rhythm, no peripheral edema, warm, nontender. Eyes: Conjunctivae clear without exudates or hemorrhage Neck: Supple, no carotid bruits. Pulmonary: Clear to auscultation bilaterally   NEUROLOGICAL EXAM:  MENTAL STATUS: Speech:    Speech is normal; fluent and spontaneous with normal comprehension.  Cognition:     Orientation to time, place and person     Normal recent and remote memory     Normal Attention span and concentration     Normal Language, naming, repeating,spontaneous speech     Fund of knowledge   CRANIAL NERVES: CN II: Visual fields are full to confrontation.  Pupils are round equal and briskly reactive to light. CN III, IV, VI:  extraocular movement are normal. No ptosis. CN V: Facial sensation is intact to pinprick in all 3 divisions bilaterally. Corneal responses are intact.  CN VII: Face is symmetric with normal eye closure and smile. CN VIII: Hearing is normal to rubbing fingers CN IX, X: Palate elevates symmetrically. Phonation is normal. CN XI: Head turning and shoulder shrug are intact CN XII: Tongue is midline with normal movements and no atrophy.  MOTOR: Left arm pronation drift,  fixation on rapid rotating movement, mild bilateral toe extension weakness  REFLEXES: Hyperreflexia on the left upper, and lower extremities, left side Babinski signs,  SENSORY: Length dependent decreased to light touch, pinprick, vibratory sensation to ankle level  COORDINATION: She has truncal ataxia, no significant bleeding dysmetria noticed.  GAIT/STANCE: She needs push-up to get up from seated position, wide-based, unsteady, not keep her balance feet together, did better with her feet apart, difficulty perform tiptoe, heel standing  DIAGNOSTIC DATA (LABS, IMAGING, TESTING) - I reviewed patient records, labs, notes, testing and imaging myself where available.   ASSESSMENT AND PLAN  MARGARIE KILCULLEN is a 78 y.o. 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 Ph.D.  [facility] [address] [place], [place] [address] Ph: [phone] Fax: [phone]  CC: Alyson Ingles, [place]

## 2018-11-13 ENCOUNTER — Ambulatory Visit: Payer: Medicare Other | Admitting: Nurse Practitioner

## 2018-11-22 ENCOUNTER — Encounter (HOSPITAL_COMMUNITY): Payer: Medicare Other

## 2018-11-22 ENCOUNTER — Other Ambulatory Visit (HOSPITAL_COMMUNITY): Payer: Medicare Other

## 2018-12-02 ENCOUNTER — Encounter (HOSPITAL_COMMUNITY): Payer: Medicare Other

## 2018-12-04 ENCOUNTER — Ambulatory Visit (HOSPITAL_BASED_OUTPATIENT_CLINIC_OR_DEPARTMENT_OTHER)
Admission: RE | Admit: 2018-12-04 | Discharge: 2018-12-04 | Disposition: A | Discharge status: Home / Self Care | Payer: Medicare Other | Source: Ambulatory Visit | Attending: Neurology | Admitting: Neurology

## 2018-12-04 ENCOUNTER — Telehealth: Payer: Self-pay | Admitting: Neurology

## 2018-12-04 ENCOUNTER — Other Ambulatory Visit: Payer: Self-pay

## 2018-12-04 ENCOUNTER — Ambulatory Visit (INDEPENDENT_AMBULATORY_CARE_PROVIDER_SITE_OTHER): Payer: Medicare Other | Admitting: Urology

## 2018-12-04 ENCOUNTER — Ambulatory Visit (HOSPITAL_COMMUNITY)
Admission: RE | Admit: 2018-12-04 | Discharge: 2018-12-04 | Disposition: A | Discharge status: Home / Self Care | Payer: Medicare Other | Source: Ambulatory Visit | Attending: Neurology | Admitting: Neurology

## 2018-12-04 DIAGNOSIS — G3281 Cerebellar ataxia in diseases classified elsewhere: Secondary | ICD-10-CM

## 2018-12-04 DIAGNOSIS — G959 Disease of spinal cord, unspecified: Secondary | ICD-10-CM

## 2018-12-04 DIAGNOSIS — R310 Gross hematuria: Secondary | ICD-10-CM

## 2018-12-04 DIAGNOSIS — R269 Unspecified abnormalities of gait and mobility: Secondary | ICD-10-CM | POA: Diagnosis not present

## 2018-12-04 DIAGNOSIS — G459 Transient cerebral ischemic attack, unspecified: Secondary | ICD-10-CM | POA: Diagnosis not present

## 2018-12-04 DIAGNOSIS — N3941 Urge incontinence: Secondary | ICD-10-CM

## 2018-12-04 NOTE — Telephone Encounter (Signed)
Please call patient for less than 39% stenosis of bilateral internal carotid artery, bilateral vertebral arteries are antegrade flow.

## 2018-12-04 NOTE — Telephone Encounter (Signed)
Spoke to her daughter on [place], Sheliah Hatch.  She verbalized understanding of the results below.

## 2018-12-04 NOTE — Progress Notes (Signed)
  Echocardiogram 2D Echocardiogram has been performed.  Jennette Dubin 12/04/2018, 9:03 AM

## 2018-12-05 ENCOUNTER — Telehealth: Payer: Self-pay | Admitting: *Deleted

## 2018-12-05 ENCOUNTER — Telehealth: Payer: Self-pay | Admitting: Neurology

## 2018-12-05 NOTE — Telephone Encounter (Signed)
I spoke to her daughter, Sheliah Hatch (on [place]), and provided her with the results below.  She verbalized understanding of the findings.

## 2018-12-05 NOTE — Telephone Encounter (Signed)
-----   Message from Marcial Pacas, MD sent at 12/04/2018  5:38 PM EDT ----- Please call patient for no significant abnormality on echocardiogram

## 2018-12-05 NOTE — Telephone Encounter (Signed)
Left patient a detailed message, with results, on voicemail (ok per DPR).  Provided our number to call back with any questions.  

## 2018-12-05 NOTE — Telephone Encounter (Signed)
Please call patient, ultrasound of carotid arteries showed less than 39% stenosis of bilateral internal carotid artery  Echocardiogram showed ejection fraction of 60 to 65%,   Right Carotid: Velocities in the right ICA are consistent with a 1-39% stenosis.  Left Carotid: Velocities in the left ICA are consistent with a 1-39% stenosis.  Vertebrals: Bilateral vertebral arteries demonstrate antegrade flow.  *See table(s) above for measurements and observations.   1. Left ventricular ejection fraction, by visual estimation, is 60 to 65%. The left ventricle has normal function. There is no left ventricular hypertrophy.  2. Left ventricular diastolic Doppler parameters are consistent with impaired relaxation pattern of LV diastolic filling.  3. Global right ventricle has normal systolic function.The right ventricular size is normal. No increase in right ventricular wall thickness.  4. Left atrial size was normal.  5. Right atrial size was normal.  6. The mitral valve is grossly normal. Trace mitral valve regurgitation.  7. The tricuspid valve is normal in structure. Tricuspid valve regurgitation was not visualized by color flow Doppler.  8. The aortic valve is normal in structure. Aortic valve regurgitation was not visualized by color flow Doppler.  9. The pulmonic valve was grossly normal. Pulmonic valve regurgitation is not visualized by color flow Doppler. 10. Mildly elevated pulmonary artery systolic pressure. 11. The atrial septum is grossly normal.

## 2019-01-15 ENCOUNTER — Ambulatory Visit: Payer: Medicare Other | Admitting: Urology

## 2019-02-11 ENCOUNTER — Other Ambulatory Visit: Payer: Self-pay | Admitting: Neurosurgery

## 2019-02-27 ENCOUNTER — Other Ambulatory Visit: Payer: Self-pay | Admitting: Neurosurgery

## 2019-03-03 ENCOUNTER — Encounter (HOSPITAL_COMMUNITY): Payer: Self-pay

## 2019-03-03 ENCOUNTER — Emergency Department (HOSPITAL_COMMUNITY)
Admission: EM | Admit: 2019-03-03 | Discharge: 2019-03-03 | Disposition: A | Discharge status: Home / Self Care | Payer: Medicare Other | Attending: Emergency Medicine | Admitting: Emergency Medicine

## 2019-03-03 ENCOUNTER — Emergency Department (HOSPITAL_COMMUNITY): Payer: Medicare Other

## 2019-03-03 DIAGNOSIS — J449 Chronic obstructive pulmonary disease, unspecified: Secondary | ICD-10-CM | POA: Insufficient documentation

## 2019-03-03 DIAGNOSIS — M545 Low back pain, unspecified: Secondary | ICD-10-CM

## 2019-03-03 DIAGNOSIS — Y9389 Activity, other specified: Secondary | ICD-10-CM | POA: Diagnosis not present

## 2019-03-03 DIAGNOSIS — Y999 Unspecified external cause status: Secondary | ICD-10-CM | POA: Diagnosis not present

## 2019-03-03 DIAGNOSIS — Z7982 Long term (current) use of aspirin: Secondary | ICD-10-CM | POA: Insufficient documentation

## 2019-03-03 DIAGNOSIS — S7002XA Contusion of left hip, initial encounter: Secondary | ICD-10-CM | POA: Insufficient documentation

## 2019-03-03 DIAGNOSIS — Z79899 Other long term (current) drug therapy: Secondary | ICD-10-CM | POA: Insufficient documentation

## 2019-03-03 DIAGNOSIS — W19XXXA Unspecified fall, initial encounter: Secondary | ICD-10-CM

## 2019-03-03 DIAGNOSIS — I10 Essential (primary) hypertension: Secondary | ICD-10-CM | POA: Insufficient documentation

## 2019-03-03 DIAGNOSIS — Y92002 Bathroom of unspecified non-institutional (private) residence single-family (private) house as the place of occurrence of the external cause: Secondary | ICD-10-CM | POA: Diagnosis not present

## 2019-03-03 DIAGNOSIS — F17210 Nicotine dependence, cigarettes, uncomplicated: Secondary | ICD-10-CM | POA: Diagnosis not present

## 2019-03-03 DIAGNOSIS — E039 Hypothyroidism, unspecified: Secondary | ICD-10-CM | POA: Insufficient documentation

## 2019-03-03 DIAGNOSIS — S79912A Unspecified injury of left hip, initial encounter: Secondary | ICD-10-CM | POA: Diagnosis present

## 2019-03-03 DIAGNOSIS — W01198A Fall on same level from slipping, tripping and stumbling with subsequent striking against other object, initial encounter: Secondary | ICD-10-CM | POA: Insufficient documentation

## 2019-03-03 MED ORDER — OXYCODONE-ACETAMINOPHEN 5-325 MG PO TABS
1.0000 | ORAL_TABLET | Freq: Once | ORAL | Status: AC
  Administered 2019-03-03: 1 via ORAL
  Filled 2019-03-03: qty 1

## 2019-03-03 NOTE — ED Triage Notes (Signed)
Pt reports legs gave out when she was in the BR this morning approx 1130. Pt refused initial EMS transport. Pt reports pain in buttocks and down left leg

## 2019-03-05 NOTE — Progress Notes (Signed)
Attempted to contact patient to schedule pre-procedure COVID appointment, no answer, left message   

## 2019-03-11 NOTE — Pre-Procedure Instructions (Signed)
[facility] - [facility], [place] - [address] [place] [address] Phone: [phone] Fax: [phone]    Your procedure is scheduled on Tues., Jan. 5, 2021 from 7:30AM-11:02AM  Report to [facility] Entrance "A" at 5:30AM  Call this number if you have problems the morning of surgery:  [phone]   Remember:  Do not eat or drink after midnight on Jan. 4th    Take these medicines the morning of surgery with A SIP OF WATER: HYDROcodone-acetaminophen (NORCO/VICODIN)  Levothyroxine (SYNTHROID)   Omeprazole (PRILOSEC) PARoxetine (PAXIL)   Follow your surgeon's instructions on when to stop Aspirin.  If no instructions were given by your surgeon then you will need to call the office to get those instructions.   As of today, stop taking all Aspirin (unless instructed by your doctor) and Other Aspirin containing products, Vitamins, Fish oils, and Herbal medications. Also stop all NSAIDS i.e. Advil, Ibuprofen, Motrin, Aleve, Anaprox, Naproxen, BC, Goody Powders, and all Supplements.  Including: Celecoxib (CELEBREX)  No Smoking of any kind, Tobacco, or Alcohol products 24 hours prior to your procedure. If you use a Cpap at night, you may bring the machine and all equipment for your overnight stay.   Special instructions:   [place]- Preparing For Surgery  Before surgery, you can play an important role. Because skin is not sterile, your skin needs to be as free of germs as possible. You can reduce the number of germs on your skin by washing with CHG (chlorahexidine gluconate) Soap before surgery.  CHG is an antiseptic cleaner which kills germs and bonds with the skin to continue killing germs even after washing.    Please do not use if you have an allergy to CHG or antibacterial soaps. If your skin becomes reddened/irritated stop using the CHG.  Do not shave (including legs and underarms) for at least 48 hours prior to first CHG shower. It is OK to  shave your face.  Please follow these instructions carefully.   1. Shower the NIGHT BEFORE SURGERY and the MORNING OF SURGERY with CHG.   2. If you chose to wash your hair, wash your hair first as usual with your normal shampoo.  3. After you shampoo, rinse your hair and body thoroughly to remove the shampoo.  4. Use CHG as you would any other liquid soap. You can apply CHG directly to the skin and wash gently with a scrungie or a clean washcloth.   5. Apply the CHG Soap to your body ONLY FROM THE NECK DOWN.  Do not use on open wounds or open sores. Avoid contact with your eyes, ears, mouth and genitals (private parts). Wash Face and genitals (private parts)  with your normal soap.  6. Wash thoroughly, paying special attention to the area where your surgery will be performed.  7. Thoroughly rinse your body with warm water from the neck down.  8. DO NOT shower/wash with your normal soap after using and rinsing off the CHG Soap.  9. Pat yourself dry with a CLEAN TOWEL.  10. Wear CLEAN PAJAMAS to bed the night before surgery, wear comfortable clothes the morning of surgery  11. Place CLEAN SHEETS on your bed the night of your first shower and DO NOT SLEEP WITH PETS.   Day of Surgery:           Remember to brush your teeth WITH YOUR REGULAR TOOTHPASTE.  Do not wear jewelry, make-up or nail polish.  Do not wear lotions, powders, or perfumes, or deodorant.  Do not shave 48 hours prior to surgery.    Do not bring valuables to the hospital.  [facility] is not responsible for any belongings or valuables.  Contacts, dentures or bridgework may not be worn into surgery.    For patients admitted to the hospital, discharge time will be determined by your treatment team.  Patients discharged the day of surgery will not be allowed to drive home, and someone age 23 and over needs to stay with them for 24 hours.  Please wear clean clothes to the hospital/surgery center.    Please read over  the following fact sheets that you were given.

## 2019-03-12 ENCOUNTER — Other Ambulatory Visit: Payer: Self-pay

## 2019-03-12 ENCOUNTER — Encounter (HOSPITAL_COMMUNITY): Payer: Self-pay

## 2019-03-12 ENCOUNTER — Encounter (HOSPITAL_COMMUNITY)
Admission: RE | Admit: 2019-03-12 | Discharge: 2019-03-12 | Disposition: A | Discharge status: Home / Self Care | Payer: Medicare Other | Source: Ambulatory Visit | Attending: Neurosurgery | Admitting: Neurosurgery

## 2019-03-12 DIAGNOSIS — I444 Left anterior fascicular block: Secondary | ICD-10-CM | POA: Insufficient documentation

## 2019-03-12 DIAGNOSIS — R9431 Abnormal electrocardiogram [ECG] [EKG]: Secondary | ICD-10-CM | POA: Diagnosis not present

## 2019-03-12 DIAGNOSIS — I10 Essential (primary) hypertension: Secondary | ICD-10-CM | POA: Diagnosis not present

## 2019-03-12 DIAGNOSIS — Z01818 Encounter for other preprocedural examination: Secondary | ICD-10-CM | POA: Diagnosis present

## 2019-03-12 HISTORY — DX: Chronic obstructive pulmonary disease, unspecified: J44.9

## 2019-03-12 LAB — CBC
HCT: 39.7 % (ref 36.0–46.0)
Hemoglobin: 12.8 g/dL (ref 12.0–15.0)
MCH: 31.6 pg (ref 26.0–34.0)
MCHC: 32.2 g/dL (ref 30.0–36.0)
MCV: 98 fL (ref 80.0–100.0)
Platelets: 239 10*3/uL (ref 150–400)
RBC: 4.05 MIL/uL (ref 3.87–5.11)
RDW: 13.2 % (ref 11.5–15.5)
WBC: 9.5 10*3/uL (ref 4.0–10.5)
nRBC: 0 % (ref 0.0–0.2)

## 2019-03-12 LAB — BASIC METABOLIC PANEL
Anion gap: 9 (ref 5–15)
BUN: 18 mg/dL (ref 8–23)
CO2: 28 mmol/L (ref 22–32)
Calcium: 9.7 mg/dL (ref 8.9–10.3)
Chloride: 101 mmol/L (ref 98–111)
Creatinine, Ser: 1.14 mg/dL — ABNORMAL HIGH (ref 0.44–1.00)
GFR calc Af Amer: 54 mL/min — ABNORMAL LOW (ref >60)
GFR calc non Af Amer: 47 mL/min — ABNORMAL LOW (ref >60)
Glucose, Bld: 92 mg/dL (ref 70–99)
Potassium: 4 mmol/L (ref 3.5–5.1)
Sodium: 138 mmol/L (ref 135–145)

## 2019-03-12 LAB — TYPE AND SCREEN
ABO/RH(D): O POS
Antibody Screen: NEGATIVE

## 2019-03-12 LAB — SURGICAL PCR SCREEN
MRSA, PCR: NEGATIVE
Staphylococcus aureus: NEGATIVE

## 2019-03-12 LAB — ABO/RH: ABO/RH(D): O POS

## 2019-03-12 NOTE — Progress Notes (Addendum)
PCP - Ferne Reus Cardiologist - denies Neurologist: Dr. Krista Blue  PPM/ICD - N/A Device Orders - N/A Rep Notified - N/A  Chest x-ray - N/A EKG - 03/12/19 Stress Test - denies ECHO - 12/04/18 Cardiac Cath - denies  Sleep Study - denies  Pt with h/o COPD, current smoker. Patient is supposed to be on continuous O2 per daughter. States she only uses at night since she still smokes during the day. Educated to refrain from smoking at least 24 hours prior to surgery. Currently on O2 2L [place] QHS  Aspirin Instructions: Last dose of ASA 03/10/19  ERAS Protcol -N/A PRE-SURGERY Ensure or G2- N/A  COVID TEST- 03/14/18 at [facility]. Pt instructed to inform security they are having surgery and need to be in the PAT line.  Patient educated to quarantine at home following covid test until DOS.    Anesthesia review: O2 use  Patient denies shortness of breath, fever, cough and chest pain at PAT appointment   All instructions explained to the patient, with a verbal understanding of the material. Patient agrees to go over the instructions while at home for a better understanding. Patient also instructed to self quarantine after being tested for COVID-19. The opportunity to ask questions was provided.

## 2019-03-12 NOTE — Pre-Procedure Instructions (Signed)
[facility], [place] [address] Phone: [phone] Fax: [phone]    Your procedure is scheduled on Tuesday, January 5th, 2021.  Report to [facility] Entrance "A" at 5:30AM  Call this number if you have problems the morning of surgery:  [phone]   Remember:  Do not eat or drink after midnight on Jan. 4th    Take these medicines the morning of surgery with A SIP OF WATER: HYDROcodone-acetaminophen (NORCO/VICODIN)  Levothyroxine (SYNTHROID)   Omeprazole (PRILOSEC) PARoxetine (PAXIL)   Follow your surgeon's instructions on when to stop Aspirin.  If no instructions were given by your surgeon then you will need to call the office to get those instructions.   As of today, stop taking all Aspirin (unless instructed by your doctor) and Other Aspirin containing products, Vitamins, Fish oils, and Herbal medications. Also stop all NSAIDS i.e. Advil, Ibuprofen, Motrin, Aleve, Anaprox, Naproxen, BC, Goody Powders, and all Supplements.  Including: Celecoxib (CELEBREX)  No Smoking of any kind, Tobacco, or Alcohol products 24 hours prior to your procedure. If you use a Cpap at night, you may bring the machine and all equipment for your overnight stay.   Special instructions:   [place]- Preparing For Surgery  Before surgery, you can play an important role. Because skin is not sterile, your skin needs to be as free of germs as possible. You can reduce the number of germs on your skin by washing with CHG (chlorahexidine gluconate) Soap before surgery.  CHG is an antiseptic cleaner which kills germs and bonds with the skin to continue killing germs even after washing.    Please do not use if you have an allergy to CHG or antibacterial soaps. If your skin becomes reddened/irritated stop using the CHG.  Do not shave (including legs and underarms) for at least 48 hours prior to first CHG shower. It is OK to shave your  face.  Please follow these instructions carefully.   1. Shower the NIGHT BEFORE SURGERY and the MORNING OF SURGERY with CHG.   2. If you chose to wash your hair, wash your hair first as usual with your normal shampoo.  3. After you shampoo, rinse your hair and body thoroughly to remove the shampoo.  4. Use CHG as you would any other liquid soap. You can apply CHG directly to the skin and wash gently with a scrungie or a clean washcloth.   5. Apply the CHG Soap to your body ONLY FROM THE NECK DOWN.  Do not use on open wounds or open sores. Avoid contact with your eyes, ears, mouth and genitals (private parts). Wash Face and genitals (private parts)  with your normal soap.  6. Wash thoroughly, paying special attention to the area where your surgery will be performed.  7. Thoroughly rinse your body with warm water from the neck down.  8. DO NOT shower/wash with your normal soap after using and rinsing off the CHG Soap.  9. Pat yourself dry with a CLEAN TOWEL.  10. Wear CLEAN PAJAMAS to bed the night before surgery, wear comfortable clothes the morning of surgery  11. Place CLEAN SHEETS on your bed the night of your first shower and DO NOT SLEEP WITH PETS.   Day of Surgery:           Remember to brush your teeth WITH YOUR REGULAR TOOTHPASTE.  Do not wear jewelry, make-up or nail polish.  Do not  wear lotions, powders, or perfumes, or deodorant.  Do not shave 48 hours prior to surgery.    Do not bring valuables to the hospital.  [facility] is not responsible for any belongings or valuables.  Contacts, dentures or bridgework may not be worn into surgery.    For patients admitted to the hospital, discharge time will be determined by your treatment team.  Patients discharged the day of surgery will not be allowed to drive home, and someone age 25 and over needs to stay with them for 24 hours.  Please wear clean clothes to the hospital/surgery center.    Please read over the  following fact sheets that you were given.

## 2019-03-13 NOTE — Progress Notes (Addendum)
Anesthesia Chart Review:  Hx of COPD with nocturnal 2L O2 requirement. Pt continues to smoke 1ppd. Unfortunately the patient's oxygen saturation was incorrectly recorded at PAT, reportedly 99% per pt daughter but actual number not recorded. Most recent recorded O2 was 91% on 03/03/19 at [facility] ED when she was seen for low back pain. Pt daughter reports she was put on oxygen 2L QHS about 2 years ago by PCP Dr. Melina Copa. Dr. Melina Copa has recently retired and pt is now followed by Sharlyne Cai, NP in that practice. Reviewed last OV note from [facility] 11/21/18, no changes to management noted.   Pt has been following with neurologist Dr. Krista Blue for progressive decline in ability to ambulate, incontinence, neck pain, back pain. She had had extensive workup and her symptoms are felt due to combination of cervical myelopathy as well as cerebral small vessel disease. Dr. Krista Blue referred pt for neurosurgery eval to address cervical myelopathy. She also ordered preop  carotid dopplers and echocardiogram, both of which were essentially normal, <39% bilat ICA stenosis, LVEF 60-65%, imparled relaxation, no significant valvular abnormalities. 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 Krista Blue commented on. Recommendation from radiology was repeat in 6 months and referral to vascular surgery - it does not appear pt has been seen yet by vascular. Daughter aware and will followup with Dr. Krista Blue and/or vascular regarding this.   HTN appears to be poorly controlled. BP 143/110 at PAT. BP at ED visit 03/03/19 169/119.   Pt HOH and with cognitive impairment, needs daughter to assist answering questions.   Proep labs reviewed, unremarkable.   EKG 03/12/19:Normal sinus rhythm. Rate 63. Left anterior fascicular block. Possible Inferior infarct , age undetermined. No significant change since last tracing  Carotid US 12/04/18: Summary: Right Carotid: Velocities in the right ICA are  consistent with a 1-39% stenosis.  Left Carotid: Velocities in the left ICA are consistent with a 1-39% stenosis.  Vertebrals: Bilateral vertebral arteries demonstrate antegrade flow.  TTE 12/04/18: 1. Left ventricular ejection fraction, by visual estimation, is 60 to 65%. The left ventricle has normal function. There is no left ventricular hypertrophy.  2. Left ventricular diastolic Doppler parameters are consistent with impaired relaxation pattern of LV diastolic filling.  3. Global right ventricle has normal systolic function.The right ventricular size is normal. No increase in right ventricular wall thickness.  4. Left atrial size was normal.  5. Right atrial size was normal.  6. The mitral valve is grossly normal. Trace mitral valve regurgitation.  7. The tricuspid valve is normal in structure. Tricuspid valve regurgitation was not visualized by color flow Doppler.  8. The aortic valve is normal in structure. Aortic valve regurgitation was not visualized by color flow Doppler.  9. The pulmonic valve was grossly normal. Pulmonic valve regurgitation is not visualized by color flow Doppler. 10. Mildly elevated pulmonary artery systolic pressure. 11. The atrial septum is grossly normal.

## 2019-03-13 NOTE — Anesthesia Preprocedure Evaluation (Addendum)
Anesthesia Evaluation  Patient identified by MRN, date of birth, ID band Patient awake    Reviewed: Allergy & Precautions, NPO status , Patient's Chart, lab work & pertinent test results  History of Anesthesia Complications Negative for: history of anesthetic complications  Airway Mallampati: I  TM Distance: >3 FB Neck ROM: Full    Dental  (+) Edentulous Upper, Partial Lower, Dental Advisory Given   Pulmonary COPD,  COPD inhaler, Current Smoker,    Pulmonary exam normal        Cardiovascular hypertension, Pt. on medications Normal cardiovascular exam  IMPRESSIONS    1. Left ventricular ejection fraction, by visual estimation, is 60 to 65%. The left ventricle has normal function. There is no left ventricular hypertrophy.  2. Left ventricular diastolic Doppler parameters are consistent with impaired relaxation pattern of LV diastolic filling.  3. Global right ventricle has normal systolic function.The right ventricular size is normal. No increase in right ventricular wall thickness.  4. Left atrial size was normal.  5. Right atrial size was normal.  6. The mitral valve is grossly normal. Trace mitral valve regurgitation.  7. The tricuspid valve is normal in structure. Tricuspid valve regurgitation was not visualized by color flow Doppler.  8. The aortic valve is normal in structure. Aortic valve regurgitation was not visualized by color flow Doppler.  9. The pulmonic valve was grossly normal. Pulmonic valve regurgitation is not visualized by color flow Doppler. 10. Mildly elevated pulmonary artery systolic pressure. 11. The atrial septum is grossly normal.   Neuro/Psych PSYCHIATRIC DISORDERS Anxiety Depression Cerebral Ataxia CVA    GI/Hepatic negative GI ROS, Neg liver ROS,   Endo/Other  Hypothyroidism   Renal/GU negative Renal ROS     Musculoskeletal negative musculoskeletal ROS (+)   Abdominal   Peds   Hematology negative hematology ROS (+)   Anesthesia Other Findings Day of surgery medications reviewed with the patient.  Reproductive/Obstetrics                           Anesthesia Physical Anesthesia Plan  ASA: III  Anesthesia Plan: General   Post-op Pain Management:    Induction: Intravenous  PONV Risk Score and Plan: 3 and Ondansetron, Dexamethasone and Diphenhydramine  Airway Management Planned: Video Laryngoscope Planned and Oral ETT  Additional Equipment:   Intra-op Plan:   Post-operative Plan: Extubation in OR  Informed Consent: I have reviewed the patients History and Physical, chart, labs and discussed the procedure including the risks, benefits and alternatives for the proposed anesthesia with the patient or authorized representative who has indicated his/her understanding and acceptance.     Dental advisory given  Plan Discussed with: Anesthesiologist and CRNA  Anesthesia Plan Comments: (Hx of COPD with nocturnal 2L O2 requirement. Pt continues to smoke 1ppd. Unfortunately the patient's oxygen saturation was incorrectly recorded at PAT, reportedly 99% per pt daughter but actual number not recorded. Most recent recorded O2 was 91% on 03/03/19 at [facility] ED when she was seen for low back pain. Pt daughter reports she was put on oxygen 2L QHS about 2 years ago by PCP Dr. Charm Barges. Dr. Charm Barges has recently retired and pt is now followed by Gilman Schmidt, NP in that practice. Reviewed last OV note from [facility] 11/21/18, no changes to management noted.   Pt has been following with neurologist Dr. Terrace Arabia for progressive decline in ability to ambulate, incontinence, neck pain, back pain. 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 Dr. Krista Blue referred pt for neurosurgery eval to address cervical myelopathy. She also ordered preop  carotid dopplers and  echocardiogram, both of which were essentially normal, <39% bilat ICA stenosis, LVEF 60-65%, imparled relaxation, no significant valvular abnormalities. 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 Krista Blue commented on. Recommendation from radiology was repeat in 6 months and referral to vascular surgery - it does not appear pt has been seen yet by vascular. Daughter aware and will followup with Dr. Krista Blue and/or vascular regarding this.   HTN appears to be poorly controlled. BP 143/110 at PAT. BP at ED visit 03/03/19 169/119.   Pt HOH and with cognitive impairment, needs daughter to assist answering questions.   Proep labs reviewed, unremarkable.   EKG 03/12/19:Normal sinus rhythm. Rate 63. Left anterior fascicular block. Possible Inferior infarct , age undetermined. No significant change since last tracing  Carotid US 12/04/18: Summary: Right Carotid: Velocities in the right ICA are consistent with a 1-39% stenosis.  Left Carotid: Velocities in the left ICA are consistent with a 1-39% stenosis.  Vertebrals: Bilateral vertebral arteries demonstrate antegrade flow.  TTE 12/04/18: 1. Left ventricular ejection fraction, by visual estimation, is 60 to 65%. The left ventricle has normal function. There is no left ventricular hypertrophy.  2. Left ventricular diastolic Doppler parameters are consistent with impaired relaxation pattern of LV diastolic filling.  3. Global right ventricle has normal systolic function.The right ventricular size is normal. No increase in right ventricular wall thickness.  4. Left atrial size was normal.  5. Right atrial size was normal.  6. The mitral valve is grossly normal. Trace mitral valve regurgitation.  7. The tricuspid valve is normal in structure. Tricuspid valve regurgitation was not visualized by color flow Doppler.  8. The aortic valve is normal in structure. Aortic valve regurgitation was not visualized by color flow  Doppler.  9. The pulmonic valve was grossly normal. Pulmonic valve regurgitation is not visualized by color flow Doppler. 10. Mildly elevated pulmonary artery systolic pressure. 11. The atrial septum is grossly normal.)      Anesthesia Quick Evaluation

## 2019-03-15 ENCOUNTER — Other Ambulatory Visit (HOSPITAL_COMMUNITY)
Admission: RE | Admit: 2019-03-15 | Discharge: 2019-03-15 | Disposition: A | Discharge status: Home / Self Care | Payer: Medicare Other | Source: Ambulatory Visit | Attending: Neurosurgery | Admitting: Neurosurgery

## 2019-03-15 DIAGNOSIS — Z01812 Encounter for preprocedural laboratory examination: Secondary | ICD-10-CM | POA: Insufficient documentation

## 2019-03-15 DIAGNOSIS — Z20822 Contact with and (suspected) exposure to covid-19: Secondary | ICD-10-CM | POA: Insufficient documentation

## 2019-03-15 LAB — SARS CORONAVIRUS 2 (TAT 6-24 HRS): SARS Coronavirus 2: NEGATIVE

## 2019-03-15 NOTE — ED Provider Notes (Signed)
[facility] EMERGENCY DEPARTMENT Provider Note   CSN: [id number] Arrival date & time: 03/03/19  1825     History Chief Complaint  Patient presents with  . Fall    Maria Blackwell is a 79 y.o. female.  HPI   78 year old female presenting after fall.  She was in the bathroom when she lost her balance and fell.  This fell at approximately 11:30 AM this morning.  She initially declined transportation but then was having continued pain so reconsider this.  She is having pain in her buttocks and towards her left leg.  Pain is worse with movement.  No numbness or tingling.  Denies any acute headache or neck pain.  Past Medical History:  Diagnosis Date  . COPD (chronic obstructive pulmonary disease) (HCC)   . Depression with anxiety   . Hypertension   . Hypothyroid   . Stroke [facility]) 2015    Patient Active Problem List   Diagnosis Date Noted  . 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 Cervical myelopathy (HCC) 11/12/2018  . Urinary incontinence 10/17/2018  . Gait abnormality 10/17/2018  . Left-sided low back pain with left-sided sciatica 10/17/2018  . Neck pain 10/17/2018  . Constipation 09/09/2018  . Abdominal pain 09/09/2018  . CVA (cerebral infarction) 01/03/2012  . Headache(784.0) 01/03/2012  . Vision changes 01/03/2012  . Hypertension     Past Surgical History:  Procedure Laterality Date  . ABDOMINAL HYSTERECTOMY    . CHOLECYSTECTOMY    . EYE SURGERY Right   . IR ANGIO INTRA EXTRACRAN SEL COM CAROTID INNOMINATE BILAT MOD SED  09/19/2016  . IR ANGIO VERTEBRAL SEL VERTEBRAL UNI L MOD SED  09/19/2016  . IR US GUIDE VASC ACCESS RIGHT  09/19/2016  . TUMOR REMOVAL       OB History   No obstetric history on file.     Family History  Problem Relation Age of Onset  . Cancer Mother   . Cancer Father   . Colon cancer Neg Hx     Social History   Tobacco Use  . Smoking status: Current Every Day Smoker    Packs/day: 1.00    Types:  Cigarettes  . Smokeless tobacco: Never Used  Substance Use Topics  . Alcohol use: No  . Drug use: No    Home Medications Prior to Admission medications   Medication Sig Start Date End Date Taking? Authorizing Provider  aspirin 325 MG tablet Take 1 tablet (325 mg total) by mouth daily. 01/04/12   Zannie Cove, MD  celecoxib (CELEBREX) 100 MG capsule Take 100 mg by mouth at bedtime as needed for mild pain.    [provider]  clobetasol ointment (TEMOVATE) 0.05 % Apply 1 application topically daily. 02/16/19   [provider]  gabapentin (NEURONTIN) 100 MG capsule Take 100 mg by mouth. Pt has "old rx"- takes as needed    [provider]  HYDROcodone-acetaminophen (NORCO/VICODIN) 5-325 MG tablet Take 1 tablet by mouth 2 (two) times daily.     [provider]  levothyroxine (SYNTHROID) 112 MCG tablet Take 112 mcg by mouth daily before breakfast.  09/18/18   [provider]  linaclotide (LINZESS) 145 MCG CAPS capsule Take 145 mcg by mouth daily as needed (constipation).    [provider]  omeprazole (PRILOSEC) 20 MG capsule Take 20 mg by mouth daily.  08/19/18   [provider]  OXYGEN Inhale 2 L into the lungs at bedtime.    [provider]  PARoxetine (PAXIL) 20 MG tablet Take 20 mg by mouth daily.     [provider]  triamterene-hydrochlorothiazide (DYAZIDE) 37.5-25 MG per capsule Take 1 capsule by mouth daily.    [provider]  Vitamin D, Ergocalciferol, (DRISDOL) 1.25 MG (50000 UT) CAPS capsule Take 50,000 Units by mouth 2 (two) times a week.     [provider]    Allergies    Amoxicillin  Review of Systems   Review of Systems All systems reviewed and negative, other than as noted in HPI.  Physical Exam Updated Vital Signs BP (!) 169/119 (BP Location: Right Arm)   Pulse 67   Temp 98.1 F (36.7 C) (Oral)   Ht 5\' 4"  (1.626 m)   Wt 59 kg   SpO2 91%   BMI 22.31 kg/m   Physical  Exam Vitals and nursing note reviewed.  Constitutional:      General: She is not in acute distress.    Appearance: She is well-developed.  HENT:     Head: Normocephalic and atraumatic.  Eyes:     General:        Right eye: No discharge.        Left eye: No discharge.     Conjunctiva/sclera: Conjunctivae normal.  Cardiovascular:     Rate and Rhythm: Normal rate and regular rhythm.     Heart sounds: Normal heart sounds. No murmur. No friction rub. No gallop.   Pulmonary:     Effort: Pulmonary effort is normal. No respiratory distress.     Breath sounds: Normal breath sounds.  Abdominal:     General: There is no distension.     Palpations: Abdomen is soft.     Tenderness: There is no abdominal tenderness.  Musculoskeletal:     Cervical back: Neck supple.     Comments: Mild tenderness to palpation in the left lower lumbar region towards the ileum.  She can actively range left hip although with some discomfort.  Neurovascularly intact.  No midline spinal tenderness.  Skin:    General: Skin is warm and dry.  Neurological:     Mental Status: She is alert.  Psychiatric:        Behavior: Behavior normal.        Thought Content: Thought content normal.     ED Results / Procedures / Treatments   Labs (all labs ordered are listed, but only abnormal results are displayed) Labs Reviewed - No data to display  EKG None  Radiology No results found.   DG Lumbar Spine 2-3 Views  Result Date: 03/03/2019 CLINICAL DATA:  Fall, legs gave out, pain from buttocks to left hip, history of cholecystectomy. EXAM: LUMBAR SPINE - 2-3 VIEW COMPARISON:  Lumbar MRI November 07, 2018 FINDINGS: Five non-rib-bearing lumbar type vertebral bodies are seen. No acute fracture vertebral body height loss. No traumatic listhesis is evident. Mild discogenic and facet degenerative changes are noted in the spine. There is atherosclerotic calcification within the aorta with a calcified saccular outpouching compatible  with abdominal aortic aneurysm seen on comparison MRI. Cholecystectomy clips in the right upper quadrant. Soft tissues otherwise unremarkable. Included portions of the pelvis are intact and congruent. IMPRESSION: 1. No acute bony abnormality of the lumbar spine. 2. Mild discogenic and facet degenerative changes. 3. Aortic atherosclerosis with abdominal aortic aneurysm, incompletely assessed on this exam. Electronically Signed   By: Lovena Le M.D.   On: 03/03/2019 20:53   DG Hip Unilat W or Wo Pelvis 2-3  Views Left  Result Date: 03/03/2019 CLINICAL DATA:  Fall, legs gave out, pain from buttock to left hip EXAM: DG HIP (WITH OR WITHOUT PELVIS) 2-3V LEFT COMPARISON:  Concurrent lumbar radiographs, CT abdomen pelvis November 06, 2018 FINDINGS: Bones of the pelvis are intact and congruent. The femoral heads are normally located. Mild SI joint, pubic symphysis and bilateral hip arthrosis. Enthesopathic changes noted upon both greater trochanters. Vascular calcium noted in the pelvis and medial thighs. Portion of the soft tissues of the lateral left hip are collimated from view. No large swelling or hematoma is radiographically evident. IMPRESSION: 1. No acute bony abnormality. No large swelling or hematoma is radiographically evident. 2. Mild degenerative changes in the pelvis and hips. Electronically Signed   By: Kreg Shropshire M.D.   On: 03/03/2019 20:56    Procedures Procedures (including critical care time)  Medications Ordered in ED Medications  oxyCODONE-acetaminophen (PERCOCET/ROXICET) 5-325 MG per tablet 1 tablet (1 tablet Oral Given 03/03/19 2020)    ED Course  I have reviewed the triage vital signs and the nursing notes.  Pertinent labs & imaging results that were available during my care of the patient were reviewed by me and considered in my medical decision making (see chart for details).    MDM Rules/Calculators/A&P                      79 year old female with lower back and left leg  pain after mechanical fall.  Neurovascular intact.  Reassuring imaging.  Plan symptomatic treatment for likely contusion.  Activity as tolerated.  Return precautions discussed.  Outpatient follow-up otherwise. Final Clinical Impression(s) / ED Diagnoses Final diagnoses:  Acute low back pain without sciatica, unspecified back pain laterality  Contusion of left hip, initial encounter  Fall, initial encounter    Rx / DC Orders ED Discharge Orders    None       Raeford Razor, MD 03/15/19 [phone]

## 2019-03-17 NOTE — H&P (Addendum)
Chief Complaint   Neck pain, gait instability  HPI   HPI: Maria Blackwell is a 79 y.o. female who was found to have multilevel cervical stenosis during workup for gait instability.  She is at the point where she requires the aid of a rolling walker in order to walk without falling.  She presents today for cervical decompression fusion.  She is without any concerns.   Patient Active Problem List   Diagnosis Date Noted  . Cerebellar ataxia in diseases classified elsewhere ([place]) 11/12/2018  . Cervical myelopathy ([place]) 11/12/2018  . Urinary incontinence 10/17/2018  . Gait abnormality 10/17/2018  . Left-sided low back pain with left-sided sciatica 10/17/2018  . Neck pain 10/17/2018  . Constipation 09/09/2018  . Abdominal pain 09/09/2018  . CVA (cerebral infarction) 01/03/2012  . Headache(784.0) 01/03/2012  . Vision changes 01/03/2012  . Hypertension     PMH: Past Medical History:  Diagnosis Date  . COPD (chronic obstructive pulmonary disease) ([place])   . Depression with anxiety   . Hypertension   . Hypothyroid   . Stroke [facility]) 2015    PSH: Past Surgical History:  Procedure Laterality Date  . ABDOMINAL HYSTERECTOMY    . CHOLECYSTECTOMY    . EYE SURGERY Right   . IR ANGIO INTRA EXTRACRAN SEL COM CAROTID INNOMINATE BILAT MOD SED  09/19/2016  . IR ANGIO VERTEBRAL SEL VERTEBRAL UNI L MOD SED  09/19/2016  . IR US GUIDE VASC ACCESS RIGHT  09/19/2016  . TUMOR REMOVAL      No medications prior to admission.    SH: Social History   Tobacco Use  . Smoking status: Current Every Day Smoker    Packs/day: 1.00    Types: Cigarettes  . Smokeless tobacco: Never Used  Substance Use Topics  . Alcohol use: No  . Drug use: No    MEDS: Prior to Admission medications   Medication Sig Start Date End Date Taking? Authorizing Provider  aspirin 325 MG tablet Take 1 tablet (325 mg total) by mouth daily. 01/04/12  Yes Domenic Polite, MD  celecoxib (CELEBREX) 100 MG capsule Take 100  mg by mouth at bedtime as needed for mild pain.   Yes [provider]  clobetasol ointment (TEMOVATE) 3.87 % Apply 1 application topically daily. 02/16/19  Yes [provider]  HYDROcodone-acetaminophen (NORCO/VICODIN) 5-325 MG tablet Take 1 tablet by mouth 2 (two) times daily.    Yes [provider]  levothyroxine (SYNTHROID) 112 MCG tablet Take 112 mcg by mouth daily before breakfast.  09/18/18  Yes [provider]  linaclotide (LINZESS) 145 MCG CAPS capsule Take 145 mcg by mouth daily as needed (constipation).   Yes [provider]  omeprazole (PRILOSEC) 20 MG capsule Take 20 mg by mouth daily.  08/19/18  Yes [provider]  OXYGEN Inhale 2 L into the lungs at bedtime.   Yes [provider]  PARoxetine (PAXIL) 20 MG tablet Take 20 mg by mouth daily.    Yes [provider]  triamterene-hydrochlorothiazide (DYAZIDE) 37.5-25 MG per capsule Take 1 capsule by mouth daily.   Yes [provider]  Vitamin D, Ergocalciferol, (DRISDOL) 1.25 MG (50000 UT) CAPS capsule Take 50,000 Units by mouth 2 (two) times a week.    Yes [provider]  gabapentin (NEURONTIN) 100 MG capsule Take 100 mg by mouth. Pt has "old rx"- takes as needed    [provider]    ALLERGY: Allergies  Allergen Reactions  . Amoxicillin Rash  Social History   Tobacco Use  . Smoking status: Current Every Day Smoker    Packs/day: 1.00    Types: Cigarettes  . Smokeless tobacco: Never Used  Substance Use Topics  . Alcohol use: No     Family History  Problem Relation Age of Onset  . Cancer Mother   . Cancer Father   . Colon cancer Neg Hx      ROS   ROS  Exam   There were no vitals filed for this visit. General appearance: WDWN, NAD Eyes: No scleral injection Cardiovascular: Regular rate and rhythm without murmurs, rubs, gallops. No edema or variciosities. Distal pulses normal. Pulmonary: Effort normal, non-labored  breathing Musculoskeletal:     Muscle tone upper extremities: Normal    Muscle tone lower extremities: Normal    Motor exam: Upper Extremities Deltoid Bicep Tricep Grip  Right 5/5 5/5 5/5 5/5  Left 5/5 5/5 5/5 5/5   Lower Extremity IP Quad PF DF EHL  Right 5/5 5/5 5/5 5/5 5/5  Left 5/5 5/5 5/5 5/5 5/5   Neurological Mental Status:    - Patient is awake, alert, oriented to person, place, month, year, and situation    - Patient is able to give a clear and coherent history.    - No signs of aphasia or neglect Cranial Nerves    - II: Visual Fields are full. PERRL    - III/IV/VI: EOMI without ptosis or diploplia.     - V: Facial sensation is grossly normal    - VII: Facial movement is symmetric.     - VIII: hearing is intact to voice    - X: Uvula elevates symmetrically    - XI: Shoulder shrug is symmetric.    - XII: tongue is midline without atrophy or fasciculations.  Sensory: Sensation grossly intact to LT  Results - Imaging/Labs   No results found for this or any previous visit (from the past 48 hour(s)).  No results found.  IMAGING: MRI of the cervical spine dated 11/07/2018 was personally reviewed.  This demonstrates maintenance of cervical lordosis.  The axial images are somewhat motion degraded however there does appear to be broad-based disc osteophyte complex at C4-5 C5-6, and C6-7 with resultant at least moderate central stenosis.  There does not appear to be any intrinsic T2 signal change within the spinal cord.  Impression/Plan   79 y.o. 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  We will proceed with anterior cervical decompression and fusion at the affected levels.    Risks, benefits alternatives were discussed.  Patient stated understanding wished to proceed.  Cindra Presume, PA-C [facility]

## 2019-03-18 ENCOUNTER — Ambulatory Visit (HOSPITAL_COMMUNITY): Payer: Medicare Other

## 2019-03-18 ENCOUNTER — Inpatient Hospital Stay (HOSPITAL_COMMUNITY)
Admission: RE | Admit: 2019-03-18 | Discharge: 2019-03-20 | DRG: 472 | Disposition: A | Discharge status: Skilled Nursing Facility | Payer: Medicare Other | Attending: Neurosurgery | Admitting: Neurosurgery

## 2019-03-18 ENCOUNTER — Ambulatory Visit (HOSPITAL_COMMUNITY): Payer: Medicare Other | Admitting: Vascular Surgery

## 2019-03-18 ENCOUNTER — Encounter (HOSPITAL_COMMUNITY): Payer: Self-pay | Admitting: Neurosurgery

## 2019-03-18 ENCOUNTER — Encounter (HOSPITAL_COMMUNITY)
Admission: RE | Disposition: A | Discharge status: Skilled Nursing Facility | Payer: Self-pay | Source: Home / Self Care | Attending: Neurosurgery

## 2019-03-18 ENCOUNTER — Ambulatory Visit (HOSPITAL_COMMUNITY): Payer: Medicare Other | Admitting: Physician Assistant

## 2019-03-18 DIAGNOSIS — Z8673 Personal history of transient ischemic attack (TIA), and cerebral infarction without residual deficits: Secondary | ICD-10-CM

## 2019-03-18 DIAGNOSIS — R296 Repeated falls: Secondary | ICD-10-CM | POA: Diagnosis present

## 2019-03-18 DIAGNOSIS — F17210 Nicotine dependence, cigarettes, uncomplicated: Secondary | ICD-10-CM | POA: Diagnosis present

## 2019-03-18 DIAGNOSIS — Z9981 Dependence on supplemental oxygen: Secondary | ICD-10-CM

## 2019-03-18 DIAGNOSIS — I10 Essential (primary) hypertension: Secondary | ICD-10-CM | POA: Diagnosis present

## 2019-03-18 DIAGNOSIS — R1310 Dysphagia, unspecified: Secondary | ICD-10-CM | POA: Diagnosis not present

## 2019-03-18 DIAGNOSIS — G992 Myelopathy in diseases classified elsewhere: Secondary | ICD-10-CM | POA: Diagnosis present

## 2019-03-18 DIAGNOSIS — Z90710 Acquired absence of both cervix and uterus: Secondary | ICD-10-CM

## 2019-03-18 DIAGNOSIS — Z79891 Long term (current) use of opiate analgesic: Secondary | ICD-10-CM

## 2019-03-18 DIAGNOSIS — M4040 Postural lordosis, site unspecified: Secondary | ICD-10-CM | POA: Diagnosis present

## 2019-03-18 DIAGNOSIS — M2578 Osteophyte, vertebrae: Secondary | ICD-10-CM | POA: Diagnosis present

## 2019-03-18 DIAGNOSIS — Z79899 Other long term (current) drug therapy: Secondary | ICD-10-CM | POA: Diagnosis not present

## 2019-03-18 DIAGNOSIS — Z419 Encounter for procedure for purposes other than remedying health state, unspecified: Secondary | ICD-10-CM

## 2019-03-18 DIAGNOSIS — E039 Hypothyroidism, unspecified: Secondary | ICD-10-CM | POA: Diagnosis present

## 2019-03-18 DIAGNOSIS — Z7982 Long term (current) use of aspirin: Secondary | ICD-10-CM

## 2019-03-18 DIAGNOSIS — Z9049 Acquired absence of other specified parts of digestive tract: Secondary | ICD-10-CM | POA: Diagnosis not present

## 2019-03-18 DIAGNOSIS — G119 Hereditary ataxia, unspecified: Secondary | ICD-10-CM | POA: Diagnosis present

## 2019-03-18 DIAGNOSIS — M4802 Spinal stenosis, cervical region: Principal | ICD-10-CM | POA: Diagnosis present

## 2019-03-18 DIAGNOSIS — E876 Hypokalemia: Secondary | ICD-10-CM | POA: Diagnosis not present

## 2019-03-18 DIAGNOSIS — J449 Chronic obstructive pulmonary disease, unspecified: Secondary | ICD-10-CM | POA: Diagnosis present

## 2019-03-18 DIAGNOSIS — Z20822 Contact with and (suspected) exposure to covid-19: Secondary | ICD-10-CM | POA: Diagnosis present

## 2019-03-18 DIAGNOSIS — Z880 Allergy status to penicillin: Secondary | ICD-10-CM

## 2019-03-18 DIAGNOSIS — F418 Other specified anxiety disorders: Secondary | ICD-10-CM | POA: Diagnosis present

## 2019-03-18 DIAGNOSIS — Z79890 Hormone replacement therapy (postmenopausal): Secondary | ICD-10-CM | POA: Diagnosis not present

## 2019-03-18 HISTORY — PX: ANTERIOR CERVICAL DECOMP/DISCECTOMY FUSION: SHX1161

## 2019-03-18 SURGERY — ANTERIOR CERVICAL DECOMPRESSION/DISCECTOMY FUSION 3 LEVELS
Anesthesia: General | Site: Spine Cervical

## 2019-03-18 MED ORDER — ACETAMINOPHEN 500 MG PO TABS
1000.0000 mg | ORAL_TABLET | Freq: Four times a day (QID) | ORAL | Status: AC
  Administered 2019-03-18 – 2019-03-19 (×4): 1000 mg via ORAL
  Filled 2019-03-18 (×4): qty 2

## 2019-03-18 MED ORDER — PROPOFOL 10 MG/ML IV BOLUS
INTRAVENOUS | Status: AC
  Filled 2019-03-18: qty 20

## 2019-03-18 MED ORDER — PHENOL 1.4 % MT LIQD
1.0000 | OROMUCOSAL | Status: DC | PRN
  Administered 2019-03-20: 1 via OROMUCOSAL
  Filled 2019-03-18: qty 177

## 2019-03-18 MED ORDER — CELECOXIB 200 MG PO CAPS: 200.0000 mg | ORAL_CAPSULE | Freq: Once | ORAL | Status: AC

## 2019-03-18 MED ORDER — ACETAMINOPHEN 650 MG RE SUPP: 650.0000 mg | RECTAL | Status: DC | PRN

## 2019-03-18 MED ORDER — MENTHOL 3 MG MT LOZG: 1.0000 | LOZENGE | OROMUCOSAL | Status: DC | PRN

## 2019-03-18 MED ORDER — FENTANYL CITRATE (PF) 100 MCG/2ML IJ SOLN
INTRAMUSCULAR | Status: DC | PRN
  Administered 2019-03-18: 50 ug via INTRAVENOUS
  Administered 2019-03-18: 100 ug via INTRAVENOUS
  Administered 2019-03-18: 25 ug via INTRAVENOUS

## 2019-03-18 MED ORDER — CHLORHEXIDINE GLUCONATE CLOTH 2 % EX PADS: 6.0000 | MEDICATED_PAD | Freq: Once | CUTANEOUS | Status: DC

## 2019-03-18 MED ORDER — PROPOFOL 10 MG/ML IV BOLUS
INTRAVENOUS | Status: DC | PRN
  Administered 2019-03-18: 140 mg via INTRAVENOUS

## 2019-03-18 MED ORDER — ONDANSETRON HCL 4 MG/2ML IJ SOLN: 4.0000 mg | Freq: Four times a day (QID) | INTRAMUSCULAR | Status: DC | PRN

## 2019-03-18 MED ORDER — ONDANSETRON HCL 4 MG PO TABS: 4.0000 mg | ORAL_TABLET | Freq: Four times a day (QID) | ORAL | Status: DC | PRN

## 2019-03-18 MED ORDER — LIDOCAINE 2% (20 MG/ML) 5 ML SYRINGE
INTRAMUSCULAR | Status: DC | PRN
  Administered 2019-03-18: 100 mg via INTRAVENOUS

## 2019-03-18 MED ORDER — PANTOPRAZOLE SODIUM 40 MG PO TBEC
40.0000 mg | DELAYED_RELEASE_TABLET | Freq: Every day | ORAL | Status: DC
  Administered 2019-03-18 – 2019-03-20 (×3): 40 mg via ORAL
  Filled 2019-03-18 (×3): qty 1

## 2019-03-18 MED ORDER — VANCOMYCIN HCL IN DEXTROSE 1-5 GM/200ML-% IV SOLN
INTRAVENOUS | Status: AC
  Administered 2019-03-18: 07:00:00 1000 mg via INTRAVENOUS
  Filled 2019-03-18: qty 200

## 2019-03-18 MED ORDER — 0.9 % SODIUM CHLORIDE (POUR BTL) OPTIME
TOPICAL | Status: DC | PRN
  Administered 2019-03-18: 1000 mL

## 2019-03-18 MED ORDER — SENNOSIDES-DOCUSATE SODIUM 8.6-50 MG PO TABS
1.0000 | ORAL_TABLET | Freq: Every evening | ORAL | Status: DC | PRN
  Administered 2019-03-18: 21:00:00 1 via ORAL
  Filled 2019-03-18: qty 1

## 2019-03-18 MED ORDER — THROMBIN 5000 UNITS EX SOLR
OROMUCOSAL | Status: DC | PRN
  Administered 2019-03-18: 5 mL via TOPICAL

## 2019-03-18 MED ORDER — SODIUM CHLORIDE 0.9% FLUSH: 3.0000 mL | INTRAVENOUS | Status: DC | PRN

## 2019-03-18 MED ORDER — LEVOTHYROXINE SODIUM 112 MCG PO TABS
112.0000 ug | ORAL_TABLET | Freq: Every day | ORAL | Status: DC
  Administered 2019-03-19 – 2019-03-20 (×2): 112 ug via ORAL
  Filled 2019-03-18 (×3): qty 1

## 2019-03-18 MED ORDER — SENNA 8.6 MG PO TABS
1.0000 | ORAL_TABLET | Freq: Two times a day (BID) | ORAL | Status: DC
  Administered 2019-03-19 – 2019-03-20 (×3): 8.6 mg via ORAL
  Filled 2019-03-18 (×3): qty 1

## 2019-03-18 MED ORDER — ACETAMINOPHEN 500 MG PO TABS
ORAL_TABLET | ORAL | Status: AC
  Administered 2019-03-18: 1000 mg via ORAL
  Filled 2019-03-18: qty 2

## 2019-03-18 MED ORDER — THROMBIN 20000 UNITS EX SOLR
CUTANEOUS | Status: AC
  Filled 2019-03-18: qty 20000

## 2019-03-18 MED ORDER — FENTANYL CITRATE (PF) 100 MCG/2ML IJ SOLN: 25.0000 ug | INTRAMUSCULAR | Status: DC | PRN

## 2019-03-18 MED ORDER — CELECOXIB 200 MG PO CAPS
ORAL_CAPSULE | ORAL | Status: AC
  Administered 2019-03-18: 200 mg via ORAL
  Filled 2019-03-18: qty 1

## 2019-03-18 MED ORDER — DEXAMETHASONE SODIUM PHOSPHATE 10 MG/ML IJ SOLN
INTRAMUSCULAR | Status: DC | PRN
  Administered 2019-03-18: 10 mg via INTRAVENOUS

## 2019-03-18 MED ORDER — LACTATED RINGERS IV SOLN: INTRAVENOUS | Status: DC

## 2019-03-18 MED ORDER — SODIUM CHLORIDE 0.9 % IV SOLN: INTRAVENOUS | Status: DC

## 2019-03-18 MED ORDER — VANCOMYCIN HCL 500 MG/100ML IV SOLN
500.0000 mg | Freq: Once | INTRAVENOUS | Status: AC
  Administered 2019-03-18: 21:00:00 500 mg via INTRAVENOUS
  Filled 2019-03-18: qty 100

## 2019-03-18 MED ORDER — ACETAMINOPHEN 325 MG PO TABS
650.0000 mg | ORAL_TABLET | ORAL | Status: DC | PRN
  Administered 2019-03-19: 17:00:00 650 mg via ORAL
  Filled 2019-03-18: qty 2

## 2019-03-18 MED ORDER — PAROXETINE HCL 20 MG PO TABS
20.0000 mg | ORAL_TABLET | Freq: Every day | ORAL | Status: DC
  Administered 2019-03-18 – 2019-03-20 (×3): 20 mg via ORAL
  Filled 2019-03-18 (×3): qty 1

## 2019-03-18 MED ORDER — BUPIVACAINE HCL (PF) 0.5 % IJ SOLN
INTRAMUSCULAR | Status: AC
  Filled 2019-03-18: qty 30

## 2019-03-18 MED ORDER — LIDOCAINE 2% (20 MG/ML) 5 ML SYRINGE
INTRAMUSCULAR | Status: AC
  Filled 2019-03-18: qty 5

## 2019-03-18 MED ORDER — HYDROMORPHONE HCL 1 MG/ML IJ SOLN: 0.5000 mg | INTRAMUSCULAR | Status: DC | PRN

## 2019-03-18 MED ORDER — METHOCARBAMOL 1000 MG/10ML IJ SOLN
500.0000 mg | Freq: Four times a day (QID) | INTRAVENOUS | Status: DC | PRN
  Filled 2019-03-18: qty 5

## 2019-03-18 MED ORDER — ONDANSETRON HCL 4 MG/2ML IJ SOLN
INTRAMUSCULAR | Status: DC | PRN
  Administered 2019-03-18: 4 mg via INTRAVENOUS

## 2019-03-18 MED ORDER — PHENYLEPHRINE HCL-NACL 10-0.9 MG/250ML-% IV SOLN
INTRAVENOUS | Status: DC | PRN
  Administered 2019-03-18: 40 ug/min via INTRAVENOUS

## 2019-03-18 MED ORDER — SODIUM CHLORIDE 0.9% FLUSH
3.0000 mL | Freq: Two times a day (BID) | INTRAVENOUS | Status: DC
  Administered 2019-03-18 – 2019-03-19 (×4): 3 mL via INTRAVENOUS

## 2019-03-18 MED ORDER — ONDANSETRON HCL 4 MG/2ML IJ SOLN
INTRAMUSCULAR | Status: AC
  Filled 2019-03-18: qty 2

## 2019-03-18 MED ORDER — HYDROCODONE-ACETAMINOPHEN 5-325 MG PO TABS
1.0000 | ORAL_TABLET | ORAL | Status: DC | PRN
  Administered 2019-03-19 – 2019-03-20 (×5): 1 via ORAL
  Filled 2019-03-18 (×5): qty 1

## 2019-03-18 MED ORDER — BISACODYL 10 MG RE SUPP: 10.0000 mg | Freq: Every day | RECTAL | Status: DC | PRN

## 2019-03-18 MED ORDER — LIDOCAINE-EPINEPHRINE 1 %-1:100000 IJ SOLN
INTRAMUSCULAR | Status: AC
  Filled 2019-03-18: qty 1

## 2019-03-18 MED ORDER — THROMBIN 5000 UNITS EX SOLR
CUTANEOUS | Status: AC
  Filled 2019-03-18: qty 5000

## 2019-03-18 MED ORDER — ROCURONIUM BROMIDE 10 MG/ML (PF) SYRINGE
PREFILLED_SYRINGE | INTRAVENOUS | Status: AC
  Filled 2019-03-18: qty 10

## 2019-03-18 MED ORDER — METHOCARBAMOL 500 MG PO TABS
500.0000 mg | ORAL_TABLET | Freq: Four times a day (QID) | ORAL | Status: DC | PRN
  Administered 2019-03-20 (×2): 500 mg via ORAL
  Filled 2019-03-18 (×2): qty 1

## 2019-03-18 MED ORDER — TRIAMTERENE-HCTZ 37.5-25 MG PO CAPS
1.0000 | ORAL_CAPSULE | Freq: Every day | ORAL | Status: DC
  Filled 2019-03-18: qty 1

## 2019-03-18 MED ORDER — OXYCODONE HCL 5 MG PO TABS
5.0000 mg | ORAL_TABLET | ORAL | Status: DC | PRN
  Filled 2019-03-18: qty 1

## 2019-03-18 MED ORDER — FLEET ENEMA 7-19 GM/118ML RE ENEM: 1.0000 | ENEMA | Freq: Once | RECTAL | Status: DC | PRN

## 2019-03-18 MED ORDER — DOCUSATE SODIUM 100 MG PO CAPS
100.0000 mg | ORAL_CAPSULE | Freq: Two times a day (BID) | ORAL | Status: DC
  Administered 2019-03-18 – 2019-03-20 (×5): 100 mg via ORAL
  Filled 2019-03-18 (×5): qty 1

## 2019-03-18 MED ORDER — LINACLOTIDE 145 MCG PO CAPS
145.0000 ug | ORAL_CAPSULE | Freq: Every day | ORAL | Status: DC | PRN
  Filled 2019-03-18: qty 1

## 2019-03-18 MED ORDER — ROCURONIUM BROMIDE 10 MG/ML (PF) SYRINGE
PREFILLED_SYRINGE | INTRAVENOUS | Status: DC | PRN
  Administered 2019-03-18: 60 mg via INTRAVENOUS
  Administered 2019-03-18: 40 mg via INTRAVENOUS

## 2019-03-18 MED ORDER — VANCOMYCIN HCL IN DEXTROSE 1-5 GM/200ML-% IV SOLN: 1000.0000 mg | INTRAVENOUS | Status: AC

## 2019-03-18 MED ORDER — SODIUM CHLORIDE 0.9 % IV SOLN
INTRAVENOUS | Status: DC | PRN
  Administered 2019-03-18: 500 mL

## 2019-03-18 MED ORDER — ACETAMINOPHEN 500 MG PO TABS: 1000.0000 mg | ORAL_TABLET | Freq: Once | ORAL | Status: AC

## 2019-03-18 MED ORDER — LIDOCAINE-EPINEPHRINE 1 %-1:100000 IJ SOLN
INTRAMUSCULAR | Status: DC | PRN
  Administered 2019-03-18: 4.5 mL

## 2019-03-18 MED ORDER — BUPIVACAINE HCL 0.5 % IJ SOLN
INTRAMUSCULAR | Status: DC | PRN
  Administered 2019-03-18: 4.5 mL

## 2019-03-18 MED ORDER — SUGAMMADEX SODIUM 200 MG/2ML IV SOLN
INTRAVENOUS | Status: DC | PRN
  Administered 2019-03-18: 200 mg via INTRAVENOUS

## 2019-03-18 MED ORDER — FENTANYL CITRATE (PF) 250 MCG/5ML IJ SOLN
INTRAMUSCULAR | Status: AC
  Filled 2019-03-18: qty 5

## 2019-03-18 MED ORDER — PROMETHAZINE HCL 25 MG/ML IJ SOLN: 6.2500 mg | INTRAMUSCULAR | Status: DC | PRN

## 2019-03-18 MED ORDER — DEXAMETHASONE SODIUM PHOSPHATE 10 MG/ML IJ SOLN
INTRAMUSCULAR | Status: AC
  Filled 2019-03-18: qty 1

## 2019-03-18 SURGICAL SUPPLY — 70 items
ADH SKN CLS APL DERMABOND .7 (GAUZE/BANDAGES/DRESSINGS) ×1
APL SKNCLS STERI-STRIP NONHPOA (GAUZE/BANDAGES/DRESSINGS)
BAG DECANTER FOR FLEXI CONT (MISCELLANEOUS) ×3 IMPLANT
BAND INSRT 18 STRL LF DISP RB (MISCELLANEOUS) ×2
BAND RUBBER #18 3X1/16 STRL (MISCELLANEOUS) ×6 IMPLANT
BENZOIN TINCTURE PRP APPL 2/3 (GAUZE/BANDAGES/DRESSINGS) IMPLANT
BLADE CLIPPER SURG (BLADE) IMPLANT
BLADE SURG 11 STRL SS (BLADE) ×3 IMPLANT
BLADE ULTRA TIP 2M (BLADE) IMPLANT
BUR MATCHSTICK NEURO 3.0 LAGG (BURR) ×3 IMPLANT
CAGE ENDOSKELETON TC 14X12X5 (Cage) ×6 IMPLANT
CANISTER SUCT 3000ML PPV (MISCELLANEOUS) ×3 IMPLANT
CARTRIDGE OIL MAESTRO DRILL (MISCELLANEOUS) ×1 IMPLANT
CLOSURE WOUND 1/2 X4 (GAUZE/BANDAGES/DRESSINGS)
COVER WAND RF STERILE (DRAPES) ×3 IMPLANT
DECANTER SPIKE VIAL GLASS SM (MISCELLANEOUS) ×3 IMPLANT
DERMABOND ADVANCED (GAUZE/BANDAGES/DRESSINGS) ×2
DERMABOND ADVANCED .7 DNX12 (GAUZE/BANDAGES/DRESSINGS) ×1 IMPLANT
DIFFUSER DRILL AIR PNEUMATIC (MISCELLANEOUS) ×3 IMPLANT
DRAIN CHANNEL 10M FLAT 3/4 FLT (DRAIN) IMPLANT
DRAPE C-ARM 42X72 X-RAY (DRAPES) ×6 IMPLANT
DRAPE HALF SHEET 40X57 (DRAPES) IMPLANT
DRAPE LAPAROTOMY 100X72 PEDS (DRAPES) ×3 IMPLANT
DRAPE MICROSCOPE LEICA (MISCELLANEOUS) ×3 IMPLANT
DRSG OPSITE POSTOP 3X4 (GAUZE/BANDAGES/DRESSINGS) ×4 IMPLANT
DURAPREP 6ML APPLICATOR 50/CS (WOUND CARE) ×3 IMPLANT
ELECT COATED BLADE 2.86 ST (ELECTRODE) ×3 IMPLANT
EVACUATOR SILICONE 100CC (DRAIN) IMPLANT
GAUZE 4X4 16PLY RFD (DISPOSABLE) IMPLANT
GLOVE BIO SURGEON STRL SZ 6.5 (GLOVE) ×2 IMPLANT
GLOVE BIO SURGEON STRL SZ7.5 (GLOVE) ×2 IMPLANT
GLOVE BIO SURGEONS STRL SZ 6.5 (GLOVE) ×2
GLOVE BIOGEL PI IND STRL 6.5 (GLOVE) IMPLANT
GLOVE BIOGEL PI IND STRL 7.0 (GLOVE) IMPLANT
GLOVE BIOGEL PI IND STRL 7.5 (GLOVE) ×2 IMPLANT
GLOVE BIOGEL PI INDICATOR 6.5 (GLOVE) ×2
GLOVE BIOGEL PI INDICATOR 7.0 (GLOVE) ×4
GLOVE BIOGEL PI INDICATOR 7.5 (GLOVE) ×10
GLOVE ECLIPSE 7.0 STRL STRAW (GLOVE) ×3 IMPLANT
GLOVE EXAM NITRILE XL STR (GLOVE) IMPLANT
GOWN STRL REUS W/ TWL LRG LVL3 (GOWN DISPOSABLE) ×2 IMPLANT
GOWN STRL REUS W/ TWL XL LVL3 (GOWN DISPOSABLE) IMPLANT
GOWN STRL REUS W/TWL 2XL LVL3 (GOWN DISPOSABLE) IMPLANT
GOWN STRL REUS W/TWL LRG LVL3 (GOWN DISPOSABLE) ×6
GOWN STRL REUS W/TWL XL LVL3 (GOWN DISPOSABLE)
HEMOSTAT POWDER KIT SURGIFOAM (HEMOSTASIS) ×3 IMPLANT
KIT BASIN OR (CUSTOM PROCEDURE TRAY) ×3 IMPLANT
KIT TURNOVER KIT B (KITS) ×3 IMPLANT
NDL SPNL 22GX3.5 QUINCKE BK (NEEDLE) ×1 IMPLANT
NEEDLE HYPO 22GX1.5 SAFETY (NEEDLE) ×3 IMPLANT
NEEDLE SPNL 22GX3.5 QUINCKE BK (NEEDLE) ×3 IMPLANT
NS IRRIG 1000ML POUR BTL (IV SOLUTION) ×3 IMPLANT
OIL CARTRIDGE MAESTRO DRILL (MISCELLANEOUS) ×3
PACK LAMINECTOMY NEURO (CUSTOM PROCEDURE TRAY) ×3 IMPLANT
PAD ARMBOARD 7.5X6 YLW CONV (MISCELLANEOUS) ×9 IMPLANT
PLATE 3 55XLCK NS SPNE CVD (Plate) IMPLANT
PLATE 3 ATLANTIS TRANS (Plate) ×3 IMPLANT
PUTTY DBF 3CC CORTICAL FIBERS (Putty) ×2 IMPLANT
SCREW SELF TAP VAR 4.0X13 (Screw) ×16 IMPLANT
SPONGE INTESTINAL PEANUT (DISPOSABLE) ×3 IMPLANT
SPONGE SURGIFOAM ABS GEL 100 (HEMOSTASIS) ×3 IMPLANT
STRIP CLOSURE SKIN 1/2X4 (GAUZE/BANDAGES/DRESSINGS) IMPLANT
SUT VIC AB 3-0 SH 8-18 (SUTURE) ×3 IMPLANT
SUT VICRYL 3-0 RB1 18 ABS (SUTURE) ×5 IMPLANT
TAPE CLOTH 3X10 TAN LF (GAUZE/BANDAGES/DRESSINGS) ×3 IMPLANT
TOWEL GREEN STERILE (TOWEL DISPOSABLE) ×3 IMPLANT
TOWEL GREEN STERILE FF (TOWEL DISPOSABLE) ×3 IMPLANT
TRAY FOLEY W/BAG SLVR 16FR (SET/KITS/TRAYS/PACK) ×3
TRAY FOLEY W/BAG SLVR 16FR ST (SET/KITS/TRAYS/PACK) IMPLANT
WATER STERILE IRR 1000ML POUR (IV SOLUTION) ×3 IMPLANT

## 2019-03-18 NOTE — Op Note (Signed)
NEUROSURGERY OPERATIVE NOTE   PREOP DIAGNOSIS: Cervical stenosis with myelopathy, C4-5, C5-6, C6-7  POSTOP DIAGNOSIS: Same  PROCEDURE: 1. Discectomy at C45, C56, C67 for decompression of spinal cord and exiting nerve roots  2. Placement of intervertebral biomechanical device, Medtronic Titan 25mm lordotic small width x3 3. Placement of anterior instrumentation consisting of interbody plate and screws spanning C4-C7, Medtronic Atlantis translational plate 4. Use of morselized bone allograft  5. Arthrodesis C45, C56, C67, anterior interbody technique  6. Use of intraoperative microscope  SURGEON: Dr. Lisbeth Renshaw, MD  ASSISTANT: Cindra Presume, PA-C  ANESTHESIA: General Endotracheal  EBL: 100cc  SPECIMENS: None  DRAINS: None  COMPLICATIONS: None immediate  CONDITION: Hemodynamically stable to PACU  HISTORY: Maria Blackwell is a 79 y.o. initially presented to the outpatient neurosurgery clinic with difficulty walking.  She has suffered multiple falls.  Her work-up included MRI of the cervical spine which did demonstrate significant stenosis due to disc osteophyte complex at C4-5, C5-6, and C6-7.  With her clinical and radiographic findings, surgical decompression was recommended.  After thorough discussion of the risks, benefits, and alternatives to surgery between myself and the patient and her daughter, she elected to proceed with surgery.  After all her questions were answered, informed consent was obtained and witnessed.  PROCEDURE IN DETAIL: The patient was brought to the operating room and transferred to the operative table. After induction of general anesthesia, the patient was positioned on the operative table in the supine position with all pressure points meticulously padded. The skin of the neck was then prepped and draped in the usual sterile fashion.  After timeout was conducted, the skin was infiltrated with local anesthetic. Skin incision was then made  sharply and Bovie electrocautery was used to dissect the subcutaneous tissue until the platysma was identified. The platysma was then divided and undermined. The sternocleidomastoid muscle was then identified and, utilizing natural fascial planes in the neck, the prevertebral fascia was identified and the carotid sheath was retracted laterally and the trachea and esophagus retracted medially. Again using fluoroscopy, the C5-6 disc space was identified by placement of a spinal needle within the space. Bovie electrocautery was used to dissect in the subperiosteal plane and elevate the bilateral longus coli muscles both superiorly and inferiorly to further identify the C4-5 and C6-7 disc spaces. Table-mounted retractors were then placed. At this point, the microscope was draped and brought into the field, and the remainder of the case was done under the microscope using microdissecting technique.  The C4-5 disc space was incised sharply and rongeurs were use to initially complete a discectomy. The high-speed drill was then used to complete discectomy until the posterior annulus was identified and removed and the posterior longitudinal ligament was identified. Using a nerve hook, the PLL was elevated, and Kerrison rongeurs were used to remove the posterior longitudinal ligament and the ventral thecal sac was identified. Using a combination of curettes and rongeurs, complete decompression of the thecal sac and exiting nerve roots at this level was completed, and verified using micro-nerve hook.  At this point, a 24mm lordotic interbody cage was sized and packed with morcellized bone allograft. This was then inserted and tapped into place.  Attention was then turned to the C5-6 level. In a similar fashion, discectomy was completed initially with curettes and rongeurs, and completed with the drill. The PLL was again identified, elevated and incised. Using Kerrison rongeurs, decompression of the spinal cord and exiting  roots including removal of posterior osteophytes  was completed and confirmed with a dissector.  A 36mm lordotic small width interbody cage was then sized and filled with bone allograft, and tapped into place. Position of the interbody devices was then confirmed with fluoroscopy.  Attention was then turned to the C6-7 level. In a similar fashion, discectomy was completed initially with curettes and rongeurs, and completed with the drill. The PLL was again identified, elevated and incised. In similar fashion, using Kerrison rongeurs, decompression of the spinal cord and exiting roots was completed with removal of the hypertrophic ligament and posterior osteophytes. Decompression was confirmed with a dissector.  A 74mm lordotic interbody cage was then sized and filled with bone allograft, and tapped into place. Position of the interbody devices was then confirmed with fluoroscopy.  After placement of the intervertebral devices, the anterior cervical plate was selected, and placed across the interspaces. Using a high-speed drill, the cortex of the cervical vertebral bodies was punctured, and screws inserted in the C4, C5, C6, C7 levels. Final fluoroscopic images in lateral projection was taken to confirm good hardware placement.  At this point, after all counts were verified to be correct, meticulous hemostasis was secured using a combination of bipolar electrocautery and passive hemostatics. The platysma muscle was then closed using interrupted 3-0 Vicryl sutures, and the skin was closed with a interrupted subcuticular stitch. Sterile dressings were then applied and the drapes removed.  The patient tolerated the procedure well and was extubated in the room and taken to the postanesthesia care unit in stable condition.  At the end of the case all sponge, needle, instrument, and cottonoid counts were correct.

## 2019-03-18 NOTE — Progress Notes (Signed)
Pharmacy Antibiotic Note  Maria Blackwell is a 79 y.o. female s/p cervical disc surgery. Pharmacy has been consulted for Vancomycin dosing x 1 dose post-op. PCN allergy.  Vancomycin 1gm IV given at ~6:30am.  No drain.  Plan:  Vancomycin 500 mg IV x 1 ~8pm tonight.   No follow up needed.  Pharmacy signing off.  Height: 5\' 2"  (157.5 cm) Weight: 138 lb (62.6 kg) IBW/kg (Calculated) : 50.1  Temp (24hrs), Avg:98.1 F (36.7 C), Min:97.7 F (36.5 C), Max:98.3 F (36.8 C)  Recent Labs  Lab 03/12/19 0959  WBC 9.5  CREATININE 1.14*    Estimated Creatinine Clearance: 36.5 mL/min (A) (by C-G formula based on SCr of 1.14 mg/dL (H)).    Allergies  Allergen Reactions  . Amoxicillin Rash    03/14/19, [name] Phone: [place] 03/18/2019 1:25 PM

## 2019-03-18 NOTE — TOC Initial Note (Signed)
Transition of Care [facility]) - Initial/Assessment Note    Patient Details  Name: Maria Blackwell MRN: [id number] Date of Birth: 17-Sep-1940  Transition of Care [facility]) CM/SW Contact:    Mearl Latin, LCSW Phone Number: 03/18/2019, 12:34 PM  Clinical Narrative:                 CSW received consult regarding discharge planning from patient's daughter, at bedside. She reported that patient lives alone in a senior apartment and she is concerned that patient will not do enough rehab at home with home health. She is requesting CIR for patient. CSW explained that PT will need to come evaluate patient (daughter states patient is hard of hearing and "stubborn" and will really need to be encouraged to move around). She reports that though patient lives alone, she can arrange for patient's support system to be with her post-discharge from CIR. CSW discussed SNF option as well and provided Medicare ratings list, though daughter is fearful of SNF due to COVID. CSW will continue to follow for needs.     Barriers to Discharge: Continued Medical Work up   Patient Goals and CMS Choice Patient states their goals for this hospitalization and ongoing recovery are:: Rehab CMS Medicare.gov Compare Post Acute Care list provided to:: Patient Represenative (must comment)(Daughter) Choice offered to / list presented to : Adult Children  Expected Discharge Plan and Services   In-house Referral: Clinical Social Work   Post Acute Care Choice: IP Rehab Living arrangements for the past 2 months: Apartment                                      Prior Living Arrangements/Services Living arrangements for the past 2 months: Apartment Lives with:: Self Patient language and need for interpreter reviewed:: Yes Do you feel safe going back to the place where you live?: Yes      Need for Family Participation in Patient Care: Yes (Comment) Care giver support system in place?: Yes (comment) Current home services:  DME([facility]) Criminal Activity/Legal Involvement Pertinent to Current Situation/Hospitalization: No - Comment as needed  Activities of Daily Living      Permission Sought/Granted Permission sought to share information with : Facility Medical sales representative, Family Supports Permission granted to share information with : Yes, Verbal Permission Granted  Share Information with NAME: Candy     Permission granted to share info w Relationship: Daughter  Permission granted to share info w Contact Information: [phone]  Emotional Assessment Appearance:: Appears stated age Attitude/Demeanor/Rapport: Unable to Assess Affect (typically observed): Unable to Assess Orientation: : Oriented to Self Alcohol / Substance Use: Not Applicable Psych Involvement: No (comment)  Admission diagnosis:  Stenosis of cervical spine with myelopathy (HCC) [M48.02, G99.2] Patient Active Problem List   Diagnosis Date Noted  . Stenosis of cervical spine with myelopathy (HCC) 03/18/2019  . 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 Cervical myelopathy (HCC) 11/12/2018  . Urinary incontinence 10/17/2018  . Gait abnormality 10/17/2018  . Left-sided low back pain with left-sided sciatica 10/17/2018  . Neck pain 10/17/2018  . Constipation 09/09/2018  . Abdominal pain 09/09/2018  . CVA (cerebral infarction) 01/03/2012  . Headache(784.0) 01/03/2012  . Vision changes 01/03/2012  . Hypertension    PCP:  Veverly Fells, MD Pharmacy:   [facility] [place], [place] - [address] [place] [address] Phone:  [phone] Fax: [phone]     Social Determinants of Health (SDOH) Interventions    Readmission Risk Interventions No flowsheet data found.

## 2019-03-18 NOTE — Anesthesia Procedure Notes (Signed)
Procedure Name: Intubation Date/Time: 03/18/2019 8:00 AM Performed by: Quentin Ore, CRNA Pre-anesthesia Checklist: Patient identified, Emergency Drugs available, Suction available and Patient being monitored Patient Re-evaluated:Patient Re-evaluated prior to induction Oxygen Delivery Method: Circle system utilized Preoxygenation: Pre-oxygenation with 100% oxygen Induction Type: IV induction Ventilation: Mask ventilation without difficulty Laryngoscope Size: Glidescope and 3 Tube type: Oral Tube size: 7.0 mm Number of attempts: 1 Airway Equipment and Method: Stylet and Video-laryngoscopy Placement Confirmation: ETT inserted through vocal cords under direct vision,  positive ETCO2 and breath sounds checked- equal and bilateral Secured at: 19 cm Tube secured with: Tape Dental Injury: Teeth and Oropharynx as per pre-operative assessment  Comments: Electively used Glidescope, Grade I view on Glidescope screen.

## 2019-03-18 NOTE — Transfer of Care (Signed)
Immediate Anesthesia Transfer of Care Note  Patient: Maria Blackwell  Procedure(s) Performed: ANTERIOR CERVICAL DECOMPRESSION/DISCECTOMY FUSION, CERVICAL FOUR- CERVICAL FIVE, CERVICAL FIVE- CERVICAL SIX, CERVICAL SIX- CERVICAL SEVEN (N/A Spine Cervical)  Patient Location: PACU  Anesthesia Type:General  Level of Consciousness: drowsy  Airway & Oxygen Therapy: Patient Spontanous Breathing and Patient connected to nasal cannula oxygen  Post-op Assessment: Report given to RN, Post -op Vital signs reviewed and stable and Patient moving all extremities X 4  Post vital signs: Reviewed and stable  Last Vitals:  Vitals Value Taken Time  BP    Temp    Pulse    Resp    SpO2      Last Pain:  Vitals:   03/18/19 0612  TempSrc: Oral  PainSc: 0-No pain         Complications: No apparent anesthesia complications

## 2019-03-19 ENCOUNTER — Other Ambulatory Visit: Payer: Self-pay

## 2019-03-19 ENCOUNTER — Encounter: Payer: Self-pay | Admitting: *Deleted

## 2019-03-19 LAB — CBC
HCT: 34.4 % — ABNORMAL LOW (ref 36.0–46.0)
Hemoglobin: 11.9 g/dL — ABNORMAL LOW (ref 12.0–15.0)
MCH: 31.5 pg (ref 26.0–34.0)
MCHC: 34.6 g/dL (ref 30.0–36.0)
MCV: 91 fL (ref 80.0–100.0)
Platelets: 210 10*3/uL (ref 150–400)
RBC: 3.78 MIL/uL — ABNORMAL LOW (ref 3.87–5.11)
RDW: 12.5 % (ref 11.5–15.5)
WBC: 10.6 10*3/uL — ABNORMAL HIGH (ref 4.0–10.5)
nRBC: 0 % (ref 0.0–0.2)

## 2019-03-19 LAB — APTT: aPTT: 36 seconds (ref 24–36)

## 2019-03-19 LAB — BASIC METABOLIC PANEL
Anion gap: 9 (ref 5–15)
BUN: 21 mg/dL (ref 8–23)
CO2: 25 mmol/L (ref 22–32)
Calcium: 8.6 mg/dL — ABNORMAL LOW (ref 8.9–10.3)
Chloride: 97 mmol/L — ABNORMAL LOW (ref 98–111)
Creatinine, Ser: 1.21 mg/dL — ABNORMAL HIGH (ref 0.44–1.00)
GFR calc Af Amer: 50 mL/min — ABNORMAL LOW (ref >60)
GFR calc non Af Amer: 43 mL/min — ABNORMAL LOW (ref >60)
Glucose, Bld: 110 mg/dL — ABNORMAL HIGH (ref 70–99)
Potassium: 3.2 mmol/L — ABNORMAL LOW (ref 3.5–5.1)
Sodium: 131 mmol/L — ABNORMAL LOW (ref 135–145)

## 2019-03-19 LAB — PROTIME-INR
INR: 1 (ref 0.8–1.2)
Prothrombin Time: 13 seconds (ref 11.4–15.2)

## 2019-03-19 LAB — SARS CORONAVIRUS 2 (TAT 6-24 HRS): SARS Coronavirus 2: NEGATIVE

## 2019-03-19 MED ORDER — TRIAMTERENE-HCTZ 37.5-25 MG PO TABS
1.0000 | ORAL_TABLET | Freq: Every day | ORAL | Status: DC
  Administered 2019-03-19 – 2019-03-20 (×2): 1 via ORAL
  Filled 2019-03-19 (×2): qty 1

## 2019-03-19 MED ORDER — POTASSIUM CHLORIDE 20 MEQ PO PACK
20.0000 meq | PACK | Freq: Two times a day (BID) | ORAL | Status: DC
  Administered 2019-03-19 – 2019-03-20 (×4): 20 meq via ORAL
  Filled 2019-03-19 (×6): qty 1

## 2019-03-19 NOTE — Evaluation (Signed)
Occupational Therapy Evaluation Patient Details Name: Maria Blackwell MRN: [id number] DOB: 21-Aug-1940 Today's Date: 03/19/2019    History of Present Illness Pti s a 79 y/o female who was found to have multilevel cervical stenosis, s/p ACDF C4-5, C5-6, and C6-7. PMH: cerebellar ataxia, cervical myelopathy, urniary incontinence, CVA, L sided sciatica, HTN, COPD, cognitive impairment.   Clinical Impression   PTA patient independent using RW for mobility, ADLs. Admitted for above and limited by problem list below, including impaired cognition, cervical precautions, impaired balance, generalized weakness and decreased activity tolerance. Pt oriented to self and place, re-oriented during session to having surgery on her neck and time; she follows 1 step commands given increased time and cueing (difficult to determine cognition vs HOH), and poor recall/adherence to precautions requiring max/constant cueing.  Patient requires min assist for transfers, min-mod assist for UB ADLs and mod assist for LB ADLs.  She will require 24/7 support at discharge for safety/cognition, and at this time recommend SNF level rehab to optimize independence and safety with ADls.  Will follow acutely.      Follow Up Recommendations  SNF;Supervision/Assistance - 24 hour    Equipment Recommendations  3 in 1 bedside commode    Recommendations for Other Services       Precautions / Restrictions Precautions Precautions: Fall;Cervical Precaution Booklet Issued: Yes (comment) Precaution Comments: reviewed with patient, requires max cueing to adhere functionally  Required Braces or Orthoses: Cervical Brace Cervical Brace: Hard collar;At all times Restrictions Weight Bearing Restrictions: No      Mobility Bed Mobility Overal bed mobility: Needs Assistance Bed Mobility: Rolling;Sidelying to Sit;Sit to Sidelying Rolling: Min guard Sidelying to sit: Min assist     Sit to sidelying: Min guard General bed mobility  comments: min assist to initate sitting from sidelying, cueing for technique and back precautions   Transfers Overall transfer level: Needs assistance Equipment used: Rolling walker (2 wheeled) Transfers: Sit to/from Stand Sit to Stand: Min assist         General transfer comment: min assist to steady and cueing for precautions/technique    Balance Overall balance assessment: Needs assistance Sitting-balance support: No upper extremity supported;Feet supported Sitting balance-Leahy Scale: Fair     Standing balance support: Bilateral upper extremity supported;During functional activity Standing balance-Leahy Scale: Poor Standing balance comment: relaint on UE and external support                           ADL either performed or assessed with clinical judgement   ADL Overall ADL's : Needs assistance/impaired     Grooming: Min guard;Standing   Upper Body Bathing: Minimal assistance;Sitting   Lower Body Bathing: Moderate assistance;Sit to/from stand   Upper Body Dressing : Moderate assistance;Sitting   Lower Body Dressing: Moderate assistance;Sit to/from stand   Toilet Transfer: Minimal assistance;Ambulation;RW   Toileting- Clothing Manipulation and Hygiene: Minimal assistance;Sit to/from stand       Functional mobility during ADLs: Minimal assistance;Rolling walker General ADL Comments: patient educated on cervical precautions, brace, ADL compensatory techniques but poor carryover and recall; limited by weakness, precautions, cogntiion and balance     Vision   Vision Assessment?: No apparent visual deficits     Perception     Praxis      Pertinent Vitals/Pain Pain Assessment: Faces Faces Pain Scale: Hurts a little bit Pain Location: neck  Pain Descriptors / Indicators: Discomfort;Operative site guarding Pain Intervention(s): Monitored during session;Repositioned     Hand Dominance  Left   Extremity/Trunk Assessment Upper Extremity  Assessment Upper Extremity Assessment: Generalized weakness(within cervical precautions )   Lower Extremity Assessment Lower Extremity Assessment: Defer to PT evaluation   Cervical / Trunk Assessment Cervical / Trunk Assessment: Other exceptions Cervical / Trunk Exceptions: s/p ACDF   Communication Communication Communication: HOH   Cognition Arousal/Alertness: Awake/alert Behavior During Therapy: Flat affect Overall Cognitive Status: History of cognitive impairments - at baseline Area of Impairment: Orientation;Attention;Memory;Following commands;Safety/judgement;Awareness;Problem solving                 Orientation Level: Disoriented to;Situation;Time Current Attention Level: Sustained Memory: Decreased short-term memory;Decreased recall of precautions Following Commands: Follows one step commands with increased time;Follows one step commands consistently Safety/Judgement: Decreased awareness of safety;Decreased awareness of deficits Awareness: Intellectual Problem Solving: Slow processing;Decreased initiation;Difficulty sequencing;Requires tactile cues;Requires verbal cues General Comments: pt aware she is in [place], but reports several times during session "what are you talking about I had surgery on my neck?".  Max cueing to adhere to cervical precautions functionally, with no recall of precautions.  Slow processing and increased cueing to follow commands.    General Comments       Exercises     Shoulder Instructions      Home Living Family/patient expects to be discharged to:: Private residence Living Arrangements: Alone Available Help at Discharge: Family;Friend(s);Available PRN/intermittently Type of Home: Apartment Home Access: Level entry     Home Layout: One level     Bathroom Shower/Tub: Occupational psychologist: Standard     Home Equipment: Environmental consultant - 2 wheels   Additional Comments: pt poor historian, inconsistent initally reporting  living in a house then reports apartment      Prior Functioning/Environment Level of Independence: Independent with assistive device(s)        Comments: reports using RW for mobility and ADLs, indepenently completing iADLs but poor historian        OT Problem List: Decreased strength;Decreased activity tolerance;Impaired balance (sitting and/or standing);Decreased cognition;Decreased safety awareness;Decreased knowledge of use of DME or AE;Decreased knowledge of precautions;Pain      OT Treatment/Interventions: Self-care/ADL training;DME and/or AE instruction;Therapeutic activities;Cognitive remediation/compensation;Balance training;Patient/family education    OT Goals(Current goals can be found in the care plan section) Acute Rehab OT Goals Patient Stated Goal: to lay back down OT Goal Formulation: With patient Time For Goal Achievement: 04/02/19 Potential to Achieve Goals: Good  OT Frequency: Min 2X/week   Barriers to D/C: Decreased caregiver support          Co-evaluation              AM-PAC OT "6 Clicks" Daily Activity     Outcome Measure Help from another person eating meals?: A Little Help from another person taking care of personal grooming?: A Little Help from another person toileting, which includes using toliet, bedpan, or urinal?: A Little Help from another person bathing (including washing, rinsing, drying)?: A Lot Help from another person to put on and taking off regular upper body clothing?: A Lot Help from another person to put on and taking off regular lower body clothing?: A Lot 6 Click Score: 15   End of Session Equipment Utilized During Treatment: Rolling walker;Cervical collar Nurse Communication: Mobility status;Precautions  Activity Tolerance: Patient tolerated treatment well Patient left: in bed;with call bell/phone within reach;with bed alarm set  OT Visit Diagnosis: Other abnormalities of gait and mobility (R26.89);Muscle weakness  (generalized) (M62.81);Pain;Other symptoms and signs involving cognitive function Pain - part of  body: (neck)                Time: 9767-3419 OT Time Calculation (min): 19 min Charges:  OT General Charges $OT Visit: 1 Visit OT Evaluation $OT Eval Moderate Complexity: 1 Mod  Barry Brunner, OT Acute Rehabilitation Services Pager [phone] Office [phone]   Chancy Milroy 03/19/2019, 9:17 AM

## 2019-03-19 NOTE — TOC Progression Note (Signed)
Transition of Care [facility]) - Progression Note    Patient Details  Name: Maria Blackwell MRN: [id number] Date of Birth: Aug 03, 1940  [facility]) CM/SW Contact  Mearl Latin, LCSW Phone Number: 03/19/2019, 4:56 PM  Clinical Narrative:    Per [facility], Medicare waiver is in effect and patient does not need to stay 3 nights at the hospital if she is medically stable for discharge tomorrow. Updated COVID test in process.      Barriers to Discharge: Continued Medical Work up  Expected Discharge Plan and Services   In-house Referral: Clinical Social Work   Post Acute Care Choice: IP Rehab Living arrangements for the past 2 months: Apartment                                       Social Determinants of Health (SDOH) Interventions    Readmission Risk Interventions No flowsheet data found.

## 2019-03-19 NOTE — Evaluation (Signed)
Physical Therapy Evaluation Patient Details Name: Maria Blackwell MRN: [id number] DOB: [birth year] Today's Date: 03/19/2019   History of Present Illness  Pti s a 79 y/o female who was found to have multilevel cervical stenosis, s/p ACDF C4-5, C5-6, and C6-7. PMH: cerebellar ataxia, cervical myelopathy, urniary incontinence, CVA, L sided sciatica, HTN, COPD, cognitive impairment.  Clinical Impression  Pt admitted with above. On PT evaluation, pt presents with decreased cognition, including decreased orientation, awareness, attention, and short term memory deficits. Pt with no recall of surgery or cervical precautions. Ambulating 120 feet with walker at a min guard assist level. No ataxia noted. Presents as high fall risk based on balance deficits, decreased gait speed, and decreased safety awareness. Recommending SNF at discharge unless family able to provide 24/7 supervision/assist.      Follow Up Recommendations SNF;Supervision/Assistance - 24 hour    Equipment Recommendations  None recommended by PT    Recommendations for Other Services       Precautions / Restrictions Precautions Precautions: Fall;Cervical Precaution Booklet Issued: Yes (comment) Precaution Comments: reviewed with patient, requires max cueing to adhere functionally  Required Braces or Orthoses: Cervical Brace Cervical Brace: Hard collar;At all times Restrictions Weight Bearing Restrictions: No      Mobility  Bed Mobility Overal bed mobility: Needs Assistance Bed Mobility: Rolling;Sidelying to Sit;Sit to Sidelying Rolling: Min guard Sidelying to sit: Min guard     Sit to sidelying: Min guard General bed mobility comments: Min guard for safety, no physical assist required  Transfers Overall transfer level: Needs assistance Equipment used: Rolling walker (2 wheeled) Transfers: Sit to/from Stand Sit to Stand: Min assist         General transfer comment: min assist to steady and cueing for  precautions/technique  Ambulation/Gait Ambulation/Gait assistance: Min guard Gait Distance (Feet): 120 Feet Assistive device: Rolling walker (2 wheeled) Gait Pattern/deviations: Step-through pattern;Decreased stride length;Trunk flexed Gait velocity: decreased   General Gait Details: Cues for walker proximity, but pt unable to significantly correct.  Stairs            Wheelchair Mobility    Modified Rankin (Stroke Patients Only)       Balance Overall balance assessment: Needs assistance Sitting-balance support: No upper extremity supported;Feet supported Sitting balance-Leahy Scale: Fair     Standing balance support: Bilateral upper extremity supported;During functional activity Standing balance-Leahy Scale: Poor Standing balance comment: relaint on UE and external support                             Pertinent Vitals/Pain Pain Assessment: No/denies pain Faces Pain Scale: Hurts a little bit Pain Location: neck  Pain Descriptors / Indicators: Discomfort;Operative site guarding Pain Intervention(s): Monitored during session;Repositioned    Home Living Family/patient expects to be discharged to:: Private residence Living Arrangements: Alone Available Help at Discharge: Family;Friend(s);Available PRN/intermittently Type of Home: Apartment Home Access: Level entry     Home Layout: One level Home Equipment: Walker - 2 wheels Additional Comments: pt poor historian, inconsistent initally reporting living in a house then reports apartment    Prior Function Level of Independence: Independent with assistive device(s)         Comments: reports using RW for mobility and ADLs, indepenently completing iADLs but poor historian     Hand Dominance   Dominant Hand: Left    Extremity/Trunk Assessment   Upper Extremity Assessment Upper Extremity Assessment: Generalized weakness    Lower Extremity Assessment Lower Extremity Assessment:  RLE  deficits/detail;LLE deficits/detail RLE Deficits / Details: Grossly 4/5 LLE Deficits / Details: Grossly 4/5    Cervical / Trunk Assessment Cervical / Trunk Assessment: Other exceptions Cervical / Trunk Exceptions: s/p ACDF  Communication   Communication: HOH  Cognition Arousal/Alertness: Awake/alert Behavior During Therapy: Flat affect Overall Cognitive Status: History of cognitive impairments - at baseline Area of Impairment: Orientation;Attention;Memory;Following commands;Safety/judgement;Awareness;Problem solving                 Orientation Level: Disoriented to;Situation;Time Current Attention Level: Sustained Memory: Decreased short-term memory;Decreased recall of precautions Following Commands: Follows one step commands with increased time;Follows one step commands consistently Safety/Judgement: Decreased awareness of safety;Decreased awareness of deficits Awareness: Intellectual Problem Solving: Slow processing;Decreased initiation;Difficulty sequencing;Requires tactile cues;Requires verbal cues General Comments: pt aware she is in [place], but reports several times during session "what are you talking about I had surgery on my neck?".  Max cueing to adhere to cervical precautions functionally, with no recall of precautions.  Slow processing and increased cueing to follow commands.       General Comments      Exercises     Assessment/Plan    PT Assessment Patient needs continued PT services  PT Problem List Decreased strength;Decreased activity tolerance;Decreased mobility;Decreased balance;Decreased cognition;Decreased safety awareness       PT Treatment Interventions DME instruction;Gait training;Functional mobility training;Therapeutic activities;Therapeutic exercise;Balance training;Patient/family education    PT Goals (Current goals can be found in the Care Plan section)  Acute Rehab PT Goals Patient Stated Goal: to lay back down PT Goal  Formulation: With patient Time For Goal Achievement: 04/02/19 Potential to Achieve Goals: Good    Frequency Min 5X/week   Barriers to discharge        Co-evaluation               AM-PAC PT "6 Clicks" Mobility  Outcome Measure Help needed turning from your back to your side while in a flat bed without using bedrails?: A Little Help needed moving from lying on your back to sitting on the side of a flat bed without using bedrails?: A Little Help needed moving to and from a bed to a chair (including a wheelchair)?: A Little Help needed standing up from a chair using your arms (e.g., wheelchair or bedside chair)?: A Little Help needed to walk in hospital room?: A Little Help needed climbing 3-5 steps with a railing? : A Little 6 Click Score: 18    End of Session Equipment Utilized During Treatment: Gait belt;Cervical collar Activity Tolerance: Patient tolerated treatment well Patient left: in bed;with call bell/phone within reach;with bed alarm set Nurse Communication: Mobility status PT Visit Diagnosis: Unsteadiness on feet (R26.81);Muscle weakness (generalized) (M62.81);Difficulty in walking, not elsewhere classified (R26.2)    Time: 6270-3500 PT Time Calculation (min) (ACUTE ONLY): 11 min   Charges:   PT Evaluation $PT Eval Moderate Complexity: 1 Mod          Laurina Bustle, [place], DPT Acute Rehabilitation Services Pager [phone] Office [phone]   Vanetta Mulders 03/19/2019, 9:51 AM

## 2019-03-19 NOTE — NC FL2 (Addendum)
[place] LEVEL OF CARE SCREENING TOOL     IDENTIFICATION  Patient Name: Maria Blackwell Birthdate: 10-02-1940 Sex: female Admission Date (Current Location): 03/18/2019  [facility] and [place] Number:  Herbalist and Address:  [facility], [place] [address] [place], [place] [address]      Provider Number: [id number]  Attending Physician Name and Address:  Consuella Lose, MD  Relative Name and Phone Number:  Freida Busman, daughter, [phone]    Current Level of Care: Hospital Recommended Level of Care: [place] Prior Approval Number:    Date Approved/Denied:   PASRR Number: [id number]  Discharge Plan: SNF    Current Diagnoses: Patient Active Problem List   Diagnosis Date Noted  . Stenosis of cervical spine with myelopathy ([place]) 03/18/2019  . Cerebellar ataxia in diseases classified elsewhere ([place]) 11/12/2018  . Cervical myelopathy ([place]) 11/12/2018  . Urinary incontinence 10/17/2018  . Gait abnormality 10/17/2018  . Left-sided low back pain with left-sided sciatica 10/17/2018  . Neck pain 10/17/2018  . Constipation 09/09/2018  . Abdominal pain 09/09/2018  . CVA (cerebral infarction) 01/03/2012  . Headache(784.0) 01/03/2012  . Vision changes 01/03/2012  . Hypertension     Orientation RESPIRATION BLADDER Height & Weight     Self, Situation, Place  Normal Continent Weight: 138 lb (62.6 kg) Height:  5\' 2"  (157.5 cm)  BEHAVIORAL SYMPTOMS/MOOD NEUROLOGICAL BOWEL NUTRITION STATUS      Continent Diet(Please see DC Summary)  AMBULATORY STATUS COMMUNICATION OF NEEDS Skin   Limited Assist Verbally Surgical wounds(Closed incision on neck and groin)                       Personal Care Assistance Level of Assistance  Bathing, Feeding, Dressing Bathing Assistance: Limited assistance Feeding assistance: Independent Dressing Assistance: Limited assistance     Functional Limitations Info  Sight, Hearing,  Speech Sight Info: Adequate Hearing Info: Adequate Speech Info: Adequate    SPECIAL CARE FACTORS FREQUENCY  PT (By licensed PT), OT (By licensed OT)     PT Frequency: 5x OT Frequency: 5x            Contractures Contractures Info: Not present    Additional Factors Info  Code Status, Allergies, Psychotropic Code Status Info: Full Allergies Info: Amoxicillin Psychotropic Info: Paxil         Current Medications (03/19/2019):  This is the current hospital active medication list Current Facility-Administered Medications  Medication Dose Route Frequency Provider Last Rate Last Admin  . acetaminophen (TYLENOL) tablet 650 mg  650 mg Oral Q4H PRN Costella, Vista Mink, PA-C       Or  . acetaminophen (TYLENOL) suppository 650 mg  650 mg Rectal Q4H PRN Costella, Vista Mink, PA-C      . bisacodyl (DULCOLAX) suppository 10 mg  10 mg Rectal Daily PRN Costella, Vista Mink, PA-C      . docusate sodium (COLACE) capsule 100 mg  100 mg Oral BID Costella, Vincent J, PA-C   100 mg at 03/19/19 1004  . HYDROcodone-acetaminophen (NORCO/VICODIN) 5-325 MG per tablet 1 tablet  1 tablet Oral Q4H PRN Costella, Vista Mink, PA-C      . HYDROmorphone (DILAUDID) injection 0.5-1 mg  0.5-1 mg Intravenous Q2H PRN Costella, Vista Mink, PA-C      . lactated ringers infusion   Intravenous Continuous Traci Sermon, PA-C 10 mL/hr at 03/18/19 0630 Restarted at 03/18/19 0750  . levothyroxine (SYNTHROID) tablet 112 mcg  112  mcg Oral QAC breakfast Alyson Ingles, PA-C   112 mcg at 03/19/19 0459  . linaclotide (LINZESS) capsule 145 mcg  145 mcg Oral Daily PRN Costella, Darci Current, PA-C      . menthol-cetylpyridinium (CEPACOL) lozenge 3 mg  1 lozenge Oral PRN Costella, Darci Current, PA-C       Or  . phenol (CHLORASEPTIC) mouth spray 1 spray  1 spray Mouth/Throat PRN Costella, Darci Current, PA-C      . methocarbamol (ROBAXIN) tablet 500 mg  500 mg Oral Q6H PRN Costella, Darci Current, PA-C       Or  . methocarbamol (ROBAXIN)  500 mg in dextrose 5 % 50 mL IVPB  500 mg Intravenous Q6H PRN Costella, Vincent J, PA-C      . ondansetron (ZOFRAN) tablet 4 mg  4 mg Oral Q6H PRN Costella, Vincent J, PA-C       Or  . ondansetron (ZOFRAN) injection 4 mg  4 mg Intravenous Q6H PRN Costella, Vincent J, PA-C      . oxyCODONE (Oxy IR/ROXICODONE) immediate release tablet 5-10 mg  5-10 mg Oral Q3H PRN Costella, Darci Current, PA-C      . pantoprazole (PROTONIX) EC tablet 40 mg  40 mg Oral Daily Costella, Vincent J, PA-C   40 mg at 03/19/19 1004  . PARoxetine (PAXIL) tablet 20 mg  20 mg Oral Daily Costella, Vincent J, PA-C   20 mg at 03/19/19 1004  . potassium chloride (KLOR-CON) packet 20 mEq  20 mEq Oral BID Costella, Darci Current, PA-C      . senna (SENOKOT) tablet 8.6 mg  1 tablet Oral BID Costella, Vincent J, PA-C   8.6 mg at 03/19/19 1004  . senna-docusate (Senokot-S) tablet 1 tablet  1 tablet Oral QHS PRN Alyson Ingles, PA-C   1 tablet at 03/18/19 2033  . sodium chloride flush (NS) 0.9 % injection 3 mL  3 mL Intravenous Q12H Costella, Darci Current, PA-C   3 mL at 03/18/19 2034  . sodium chloride flush (NS) 0.9 % injection 3 mL  3 mL Intravenous PRN Costella, Darci Current, PA-C      . sodium phosphate (FLEET) 7-19 GM/118ML enema 1 enema  1 enema Rectal Once PRN Costella, Darci Current, PA-C      . triamterene-hydrochlorothiazide (MAXZIDE-25) 37.5-25 MG per tablet 1 tablet  1 tablet Oral Daily Lisbeth Renshaw, MD   1 tablet at 03/19/19 1004     Discharge Medications: Please see discharge summary for a list of discharge medications.  Relevant Imaging Results:  Relevant Lab Results:   Additional Information SSN: [id number] [place], [place]

## 2019-03-19 NOTE — Progress Notes (Addendum)
  NEUROSURGERY PROGRESS NOTE   No issues overnight. Complains of appropriate neck soreness. No new N/T/W Complains of mild dysphagia but tolerating regular diet Voiding normal. Ambulating in halls Lives alone. Daughter believes she needs SNF  EXAM:  BP (!) 152/85 (BP Location: Right Arm)   Pulse 72   Temp 98.2 F (36.8 C) (Oral)   Resp 16   Ht 5\' 2"  (1.575 m)   Wt 62.6 kg   SpO2 95%   BMI 25.24 kg/m   Awake, alert, oriented  Speech fluent, appropriate  CN grossly intact  5/5 BUE/BLE  Incision: c/d/i  IMPRESSION/PLAN 79 y.o. female post op d#1 C4-5, C5-6, C6-7 ACDF. Doing well.  - CSW consulted for SNF, although question if that is the best option since she was previously living alone. Would believe with her hx of cervical myleopathy that she may qualify for CIR. I strongly believe patient will need intensive rehab for a short period of time prior to returning home safely where she has friends that check on her on a regular basis and a daughter nearby. - Mild hypokalemia: replenish, trend

## 2019-03-19 NOTE — TOC Progression Note (Signed)
Transition of Care [facility]) - Progression Note    Patient Details  Name: Maria Blackwell MRN: [id number] Date of Birth: [birth year]  Transition of Care [facility] CM/SW Contact  Mearl Latin, LCSW Phone Number: 03/19/2019, 10:50 AM  Clinical Narrative:    CSW updated patient's daughter of PT recommendation of SNF. She reports agreement with SNF placement and requested [facility] since [facility] and [facility] have had COVID outbreaks and cannot accept patients now. CSW reached out to [facility] for review. Patient will qualify for DC to SNF on Friday through Medicare guidelines.      Barriers to Discharge: Continued Medical Work up  Expected Discharge Plan and Services   In-house Referral: Clinical Social Work   Post Acute Care Choice: IP Rehab Living arrangements for the past 2 months: Apartment                                       Social Determinants of Health (SDOH) Interventions    Readmission Risk Interventions No flowsheet data found.

## 2019-03-20 LAB — BASIC METABOLIC PANEL
Anion gap: 13 (ref 5–15)
BUN: 20 mg/dL (ref 8–23)
CO2: 26 mmol/L (ref 22–32)
Calcium: 9 mg/dL (ref 8.9–10.3)
Chloride: 98 mmol/L (ref 98–111)
Creatinine, Ser: 1.07 mg/dL — ABNORMAL HIGH (ref 0.44–1.00)
GFR calc Af Amer: 58 mL/min — ABNORMAL LOW (ref >60)
GFR calc non Af Amer: 50 mL/min — ABNORMAL LOW (ref >60)
Glucose, Bld: 97 mg/dL (ref 70–99)
Potassium: 3.4 mmol/L — ABNORMAL LOW (ref 3.5–5.1)
Sodium: 137 mmol/L (ref 135–145)

## 2019-03-20 MED ORDER — ASPIRIN 325 MG PO TABS: 325.0000 mg | ORAL_TABLET | Freq: Every day | ORAL | Status: DC

## 2019-03-20 MED ORDER — HYDROCODONE-ACETAMINOPHEN 5-325 MG PO TABS: 1.0000 | ORAL_TABLET | ORAL | 0 refills | Status: DC | PRN

## 2019-03-20 MED ORDER — POTASSIUM CHLORIDE 20 MEQ PO PACK: 20.0000 meq | PACK | Freq: Two times a day (BID) | ORAL | 0 refills | Status: DC

## 2019-03-20 NOTE — Progress Notes (Signed)
Patient discharged to [facility] rehab facilty via [facility]. Discharged packet and all belongings sent with patient. Report given by am shift  Emergency planning/management officer t to Engineer, manufacturing from [employer].

## 2019-03-20 NOTE — Plan of Care (Addendum)
Patient alert, mae's with assistance, voiding adequate amount of urine, swallowing without difficulty, no c/o pain at time of discharge. Patientwill be discharged to rehab with medical personnel. Script and discharged instructions given to Medical personnel.Discharged package given to medical personnel Patient has an appointment with Dr. Conchita Paris in 3 weeks.

## 2019-03-20 NOTE — Discharge Summary (Signed)
Physician Discharge Summary  Patient ID: Maria Blackwell MRN: [id number] DOB/AGE: [birth year] 79 y.o.  Admit date: 03/18/2019 Discharge date: 03/20/2019  Admission Diagnoses:  Cervical stenosis with myelopathy  Discharge Diagnoses:  Same Active Problems:   Stenosis of cervical spine with myelopathy [facility])  Discharged Condition: Stable  Hospital Course:  MATTELYN IMHOFF is a 79 y.o. female who was admitted for the below procedure. Post op complicated by mild hypokalemia, improved with po supplementation.  At time of discharge, pain was well controlled, ambulating with Pt/OT who rec SNF, tolerating po, voiding normal. Ready for discharge to SNF.  Treatments: Surgery 1. Discectomy at C45, C56, C67 for decompression of spinal cord and exiting nerve roots  2. Placement of intervertebral biomechanical device, Medtronic Titan 55mm lordotic small width x3 3. Placement of anterior instrumentation consisting of interbody plate and screws spanning C4-C7, Medtronic Atlantis translational plate 4. Use of morselized bone allograft  5. Arthrodesis C45, C56, C67, anterior interbody technique  6. Use of intraoperative microscope  Discharge Exam: Blood pressure (!) 163/91, pulse (!) 57, temperature 98.9 F (37.2 C), temperature source Oral, resp. rate 19, height 5\' 2"  (1.575 m), weight 62.6 kg, SpO2 95 %. Awake, alert, oriented Speech fluent, appropriate CN grossly intact 5/5 BUE/BLE Wound c/d/i  Disposition:   Discharge Instructions    Call MD for:  difficulty breathing, headache or visual disturbances   Complete by: As directed    Call MD for:  persistant dizziness or light-headedness   Complete by: As directed    Call MD for:  redness, tenderness, or signs of infection (pain, swelling, redness, odor or green/yellow discharge around incision site)   Complete by: As directed    Call MD for:  severe uncontrolled pain   Complete by: As directed    Call MD for:  temperature >100.4   Complete  by: As directed    Diet general   Complete by: As directed    Increase activity slowly   Complete by: As directed    Lifting restrictions   Complete by: As directed    Do not lift anything >10lbs. Avoid bending and twisting in awkward positions. Avoid bending at the back.   May shower / Bathe   Complete by: As directed    In 24 hours. Okay to wash wound with warm soapy water. Avoid scrubbing the wound. Pat dry.   Remove dressing in 24 hours   Complete by: As directed      Allergies as of 03/20/2019      Reactions   Amoxicillin Rash      Medication List    STOP taking these medications   celecoxib 100 MG capsule Commonly known as: CELEBREX     TAKE these medications   aspirin 325 MG tablet Take 1 tablet (325 mg total) by mouth daily. Start taking on: March 26, 2019 What changed: These instructions start on March 26, 2019. If you are unsure what to do until then, ask your doctor or other care provider.   clobetasol ointment 0.05 % Commonly known as: TEMOVATE Apply 1 application topically daily.   gabapentin 100 MG capsule Commonly known as: NEURONTIN Take 100 mg by mouth. Pt has "old rx"- takes as needed   HYDROcodone-acetaminophen 5-325 MG tablet Commonly known as: NORCO/VICODIN Take 1 tablet by mouth every 4 (four) hours as needed for moderate pain. What changed:   when to take this  reasons to take this   levothyroxine 112 MCG tablet Commonly known as: SYNTHROID  Take 112 mcg by mouth daily before breakfast.   Linzess 145 MCG Caps capsule Generic drug: linaclotide Take 145 mcg by mouth daily as needed (constipation).   omeprazole 20 MG capsule Commonly known as: PRILOSEC Take 20 mg by mouth daily.   OXYGEN Inhale 2 L into the lungs at bedtime.   Paxil 20 MG tablet Generic drug: PARoxetine Take 20 mg by mouth daily.   potassium chloride 20 MEQ packet Commonly known as: KLOR-CON Take 20 mEq by mouth 2 (two) times daily.    triamterene-hydrochlorothiazide 37.5-25 MG capsule Commonly known as: DYAZIDE Take 1 capsule by mouth daily.   Vitamin D (Ergocalciferol) 1.25 MG (50000 UT) Caps capsule Commonly known as: DRISDOL Take 50,000 Units by mouth 2 (two) times a week.       Contact information for follow-up providers    Consuella Lose, MD. Schedule an appointment as soon as possible for a visit in 3 week(s).   Specialty: Neurosurgery Contact information: [address] [place] [address] [phone]            Contact information for after-discharge care    Destination    [facility] Preferred SNF .   Service: Skilled Nursing Contact information: [place] [phone]                  Signed: Traci Sermon 03/20/2019, 12:41 PM

## 2019-03-20 NOTE — Progress Notes (Signed)
  NEUROSURGERY PROGRESS NOTE   No issues overnight.  Patient states she is in no pain. No new weakness, numbness or tingling. No complaint of dysphagia; pt tolerating regular diet but has a diminished appetite today. Patient ambulating in hallway and voiding without issue.  EXAM:  BP (!) 163/91 (BP Location: Right Arm)   Pulse (!) 57   Temp 98.9 F (37.2 C) (Oral)   Resp 19   Ht 5\' 2"  (1.575 m)   Wt 62.6 kg   SpO2 95%   BMI 25.24 kg/m   Awake, alert, oriented  In c collar Speech fluent, appropriate  CN grossly intact  5/5 BUE/BLE  Incision c/d/i  IMPRESSION/PLAN 80 y.o. female post op d#2 C4-5, C5-6, C6-7 ACDF. Continuing to do well in recovery. - PT/OT rec SNF. Patient has bed available. Cleared for d/c to SNF - Mild hypokalemia: continue to replenish. Will need repeat BMP in 1 week with PCP

## 2019-03-20 NOTE — Progress Notes (Addendum)
Inpatient Rehab Admissions:  Inpatient Rehab Consult received.  I met with pt at the bedside for rehabilitation assessment. It is noted that both PT and OT recommend SNF at this time. Pt confirmed she has no DC support. Given pt's lack of caregiver assist and cognitive deficits, AC recommends SNF for post acute rehab. Per RN, pt has been accepted to SNF and is discharging today.   AC will sign off. Please call if questions.   Raechel Ache, OTR/L  Rehab Admissions Coordinator  [phone] 03/20/2019 1:20 PM

## 2019-03-20 NOTE — TOC Transition Note (Signed)
Transition of Care [facility]) - CM/SW Discharge Note   Patient Details  Name: Maria Blackwell MRN: [id number] Date of Birth: [birth year]  [facility]) CM/SW Contact:  Mearl Latin, LCSW Phone Number: 03/20/2019, 1:56 PM   Clinical Narrative:    Patient will DC to: [facility] Anticipated DC date: 03/20/19 Family notified: Daughter, Psychologist, occupational by: Sharin Mons   Per MD patient ready for DC to [facility]. RN, patient, patient's family, and facility notified of DC. Discharge Summary and FL2 sent to facility. RN to call report prior to discharge [phone] Room 303B). DC packet on chart. Ambulance transport requested for patient.   CSW will sign off for now as social work intervention is no longer needed. Please consult [place] again if new needs arise.  Cristobal Goldmann, LCSW Clinical Social Worker [phone]    Final next level of care: Skilled Nursing Facility Barriers to Discharge: No Barriers Identified   Patient Goals and CMS Choice Patient states their goals for this hospitalization and ongoing recovery are:: Rehab CMS Medicare.gov Compare Post Acute Care list provided to:: Patient Represenative (must comment)(Daughter) Choice offered to / list presented to : Adult Children  Discharge Placement   Existing PASRR number confirmed : 03/20/19          Patient chooses bed at: [facility] [place] Patient to be transferred to facility by: [facility] Name of family member notified: Daughter, Candy Patient and family notified of of transfer: 03/20/19  Discharge Plan and Services In-house Referral: Clinical Social Work   Post Acute Care Choice: IP Rehab                               Social Determinants of Health (SDOH) Interventions     Readmission Risk Interventions No flowsheet data found.

## 2019-03-20 NOTE — Progress Notes (Signed)
Physical Therapy Treatment Patient Details Name: Maria Blackwell MRN: [id number] DOB: October 17, 1940 Today's Date: 03/20/2019    History of Present Illness Pti s a 79 y/o female who was found to have multilevel cervical stenosis, s/p ACDF C4-5, C5-6, and C6-7. PMH: cerebellar ataxia, cervical myelopathy, urniary incontinence, CVA, L sided sciatica, HTN, COPD, cognitive impairment.    PT Comments    Pt progressing slowly towards her physical therapy goals; seems more limited by pain this session. Ambulating 100 feet at a min guard assist level; continues to require min assist for transfers.Pt displays poor balance, decreased activity tolerance, pain, and decreased cognition. D/c plan remains appropriate in context of deficits and decreased caregiver support.     Follow Up Recommendations  SNF;Supervision/Assistance - 24 hour     Equipment Recommendations  None recommended by PT    Recommendations for Other Services       Precautions / Restrictions Precautions Precautions: Fall;Cervical Precaution Booklet Issued: Yes (comment) Precaution Comments: no recall of precautions Required Braces or Orthoses: Cervical Brace Cervical Brace: Hard collar;At all times Restrictions Weight Bearing Restrictions: No    Mobility  Bed Mobility Overal bed mobility: Needs Assistance Bed Mobility: Rolling;Sidelying to Sit;Sit to Sidelying Rolling: Min guard Sidelying to sit: Min guard     Sit to sidelying: Min guard General bed mobility comments: Min guard for safety, no physical assist required  Transfers Overall transfer level: Needs assistance Equipment used: Rolling walker (2 wheeled) Transfers: Sit to/from Stand Sit to Stand: Min assist         General transfer comment: min assist to steady and cueing for precautions/technique  Ambulation/Gait Ambulation/Gait assistance: Min guard Gait Distance (Feet): 100 Feet Assistive device: Rolling walker (2 wheeled) Gait Pattern/deviations:  Step-through pattern;Decreased stride length;Trunk flexed Gait velocity: decreased   General Gait Details: Pt running into ~2 obstacles without awareness, requiring assist to correct. Cues for walker proximity,   Stairs             Wheelchair Mobility    Modified Rankin (Stroke Patients Only)       Balance Overall balance assessment: Needs assistance Sitting-balance support: No upper extremity supported;Feet supported Sitting balance-Leahy Scale: Fair     Standing balance support: Bilateral upper extremity supported;During functional activity Standing balance-Leahy Scale: Poor Standing balance comment: When pulling up pants, pt with posterior LOB requiring minA to correct by PT                            Cognition Arousal/Alertness: Awake/alert Behavior During Therapy: Flat affect Overall Cognitive Status: History of cognitive impairments - at baseline Area of Impairment: Orientation;Attention;Memory;Following commands;Safety/judgement;Awareness;Problem solving                 Orientation Level: Disoriented to;Situation;Time Current Attention Level: Sustained Memory: Decreased short-term memory;Decreased recall of precautions Following Commands: Follows one step commands with increased time;Follows one step commands consistently Safety/Judgement: Decreased awareness of safety;Decreased awareness of deficits Awareness: Intellectual Problem Solving: Slow processing;Decreased initiation;Difficulty sequencing;Requires tactile cues;Requires verbal cues General Comments: pt stating she had surgery yesterday although this is inaccurate; able to verbalize  basic needs, decreased awareness of situation/deficits, requires multimodal cues for completing tasks. shows decreased carryover of all precautions      Exercises Other Exercises Other Exercises: Scapular retractions x 5    General Comments        Pertinent Vitals/Pain Pain Assessment: Faces Faces  Pain Scale: Hurts little more Pain Location: exertional chest pain  with cough; RN aware Pain Descriptors / Indicators: Discomfort;Grimacing Pain Intervention(s): Limited activity within patient's tolerance;Monitored during session    Home Living                      Prior Function            PT Goals (current goals can now be found in the care plan section) Acute Rehab PT Goals Patient Stated Goal: to lay back down PT Goal Formulation: With patient Time For Goal Achievement: 04/02/19 Potential to Achieve Goals: Good Progress towards PT goals: Progressing toward goals    Frequency    Min 5X/week      PT Plan Current plan remains appropriate    Co-evaluation              AM-PAC PT "6 Clicks" Mobility   Outcome Measure  Help needed turning from your back to your side while in a flat bed without using bedrails?: A Little Help needed moving from lying on your back to sitting on the side of a flat bed without using bedrails?: A Little Help needed moving to and from a bed to a chair (including a wheelchair)?: A Little Help needed standing up from a chair using your arms (e.g., wheelchair or bedside chair)?: A Little Help needed to walk in hospital room?: A Little Help needed climbing 3-5 steps with a railing? : A Little 6 Click Score: 18    End of Session Equipment Utilized During Treatment: Gait belt;Cervical collar Activity Tolerance: Patient tolerated treatment well Patient left: in bed;with call bell/phone within reach;with bed alarm set Nurse Communication: Mobility status PT Visit Diagnosis: Unsteadiness on feet (R26.81);Muscle weakness (generalized) (M62.81);Difficulty in walking, not elsewhere classified (R26.2)     Time: 9678-9381 PT Time Calculation (min) (ACUTE ONLY): 20 min  Charges:  $Gait Training: 8-22 mins                     Ellamae Sia, [place], DPT Acute Rehabilitation Services Pager [phone] Office [phone]    Willy Eddy 03/20/2019, 11:22 AM

## 2019-03-20 NOTE — Anesthesia Postprocedure Evaluation (Addendum)
Anesthesia Post Note  Patient: Dwaine Gale  Procedure(s) Performed: ANTERIOR CERVICAL DECOMPRESSION/DISCECTOMY FUSION, CERVICAL FOUR- CERVICAL FIVE, CERVICAL FIVE- CERVICAL SIX, CERVICAL SIX- CERVICAL SEVEN (N/A Spine Cervical)     Patient location during evaluation: PACU Anesthesia Type: General Level of consciousness: sedated Pain management: pain level controlled Vital Signs Assessment: post-procedure vital signs reviewed and stable Respiratory status: spontaneous breathing and respiratory function stable Cardiovascular status: stable Postop Assessment: no apparent nausea or vomiting Anesthetic complications: no                 Creedence Kunesh DANIEL

## 2019-04-09 ENCOUNTER — Inpatient Hospital Stay (HOSPITAL_COMMUNITY)
Admission: EM | Admit: 2019-04-09 | Discharge: 2019-04-11 | DRG: 041 | Disposition: A | Discharge status: Skilled Nursing Facility | Payer: Medicare Other | Source: Skilled Nursing Facility | Attending: Internal Medicine | Admitting: Internal Medicine

## 2019-04-09 ENCOUNTER — Other Ambulatory Visit: Payer: Self-pay

## 2019-04-09 ENCOUNTER — Emergency Department (HOSPITAL_COMMUNITY): Payer: Medicare Other

## 2019-04-09 ENCOUNTER — Encounter (HOSPITAL_COMMUNITY): Payer: Self-pay | Admitting: Emergency Medicine

## 2019-04-09 DIAGNOSIS — I4891 Unspecified atrial fibrillation: Secondary | ICD-10-CM | POA: Diagnosis present

## 2019-04-09 DIAGNOSIS — G992 Myelopathy in diseases classified elsewhere: Secondary | ICD-10-CM | POA: Diagnosis present

## 2019-04-09 DIAGNOSIS — Z8673 Personal history of transient ischemic attack (TIA), and cerebral infarction without residual deficits: Secondary | ICD-10-CM

## 2019-04-09 DIAGNOSIS — I63411 Cerebral infarction due to embolism of right middle cerebral artery: Principal | ICD-10-CM | POA: Diagnosis present

## 2019-04-09 DIAGNOSIS — Z66 Do not resuscitate: Secondary | ICD-10-CM | POA: Diagnosis present

## 2019-04-09 DIAGNOSIS — Z20822 Contact with and (suspected) exposure to covid-19: Secondary | ICD-10-CM | POA: Diagnosis present

## 2019-04-09 DIAGNOSIS — J449 Chronic obstructive pulmonary disease, unspecified: Secondary | ICD-10-CM | POA: Diagnosis present

## 2019-04-09 DIAGNOSIS — R531 Weakness: Secondary | ICD-10-CM | POA: Diagnosis present

## 2019-04-09 DIAGNOSIS — F418 Other specified anxiety disorders: Secondary | ICD-10-CM | POA: Diagnosis present

## 2019-04-09 DIAGNOSIS — R1310 Dysphagia, unspecified: Secondary | ICD-10-CM | POA: Diagnosis present

## 2019-04-09 DIAGNOSIS — I6340 Cerebral infarction due to embolism of unspecified cerebral artery: Secondary | ICD-10-CM

## 2019-04-09 DIAGNOSIS — R471 Dysarthria and anarthria: Secondary | ICD-10-CM | POA: Diagnosis present

## 2019-04-09 DIAGNOSIS — Z981 Arthrodesis status: Secondary | ICD-10-CM

## 2019-04-09 DIAGNOSIS — R29704 NIHSS score 4: Secondary | ICD-10-CM | POA: Diagnosis present

## 2019-04-09 DIAGNOSIS — I671 Cerebral aneurysm, nonruptured: Secondary | ICD-10-CM | POA: Diagnosis present

## 2019-04-09 DIAGNOSIS — E039 Hypothyroidism, unspecified: Secondary | ICD-10-CM

## 2019-04-09 DIAGNOSIS — I472 Ventricular tachycardia: Secondary | ICD-10-CM | POA: Diagnosis not present

## 2019-04-09 DIAGNOSIS — I639 Cerebral infarction, unspecified: Secondary | ICD-10-CM | POA: Diagnosis not present

## 2019-04-09 DIAGNOSIS — Z880 Allergy status to penicillin: Secondary | ICD-10-CM

## 2019-04-09 DIAGNOSIS — H9190 Unspecified hearing loss, unspecified ear: Secondary | ICD-10-CM | POA: Diagnosis present

## 2019-04-09 DIAGNOSIS — E785 Hyperlipidemia, unspecified: Secondary | ICD-10-CM | POA: Diagnosis present

## 2019-04-09 DIAGNOSIS — F329 Major depressive disorder, single episode, unspecified: Secondary | ICD-10-CM

## 2019-04-09 DIAGNOSIS — G959 Disease of spinal cord, unspecified: Secondary | ICD-10-CM | POA: Diagnosis present

## 2019-04-09 DIAGNOSIS — F17210 Nicotine dependence, cigarettes, uncomplicated: Secondary | ICD-10-CM | POA: Diagnosis present

## 2019-04-09 DIAGNOSIS — M542 Cervicalgia: Secondary | ICD-10-CM | POA: Diagnosis present

## 2019-04-09 DIAGNOSIS — N179 Acute kidney failure, unspecified: Secondary | ICD-10-CM | POA: Diagnosis present

## 2019-04-09 DIAGNOSIS — Z79899 Other long term (current) drug therapy: Secondary | ICD-10-CM

## 2019-04-09 DIAGNOSIS — R296 Repeated falls: Secondary | ICD-10-CM | POA: Diagnosis present

## 2019-04-09 DIAGNOSIS — G9349 Other encephalopathy: Secondary | ICD-10-CM | POA: Diagnosis present

## 2019-04-09 DIAGNOSIS — E860 Dehydration: Secondary | ICD-10-CM | POA: Diagnosis present

## 2019-04-09 DIAGNOSIS — Z9981 Dependence on supplemental oxygen: Secondary | ICD-10-CM

## 2019-04-09 DIAGNOSIS — Z79890 Hormone replacement therapy (postmenopausal): Secondary | ICD-10-CM

## 2019-04-09 DIAGNOSIS — R4701 Aphasia: Secondary | ICD-10-CM | POA: Diagnosis present

## 2019-04-09 DIAGNOSIS — R262 Difficulty in walking, not elsewhere classified: Secondary | ICD-10-CM

## 2019-04-09 DIAGNOSIS — Z7982 Long term (current) use of aspirin: Secondary | ICD-10-CM

## 2019-04-09 DIAGNOSIS — F32A Depression, unspecified: Secondary | ICD-10-CM

## 2019-04-09 DIAGNOSIS — I10 Essential (primary) hypertension: Secondary | ICD-10-CM | POA: Diagnosis present

## 2019-04-09 LAB — CBC
HCT: 43.9 % (ref 36.0–46.0)
Hemoglobin: 13.8 g/dL (ref 12.0–15.0)
MCH: 30.7 pg (ref 26.0–34.0)
MCHC: 31.4 g/dL (ref 30.0–36.0)
MCV: 97.8 fL (ref 80.0–100.0)
Platelets: 328 10*3/uL (ref 150–400)
RBC: 4.49 MIL/uL (ref 3.87–5.11)
RDW: 13.3 % (ref 11.5–15.5)
WBC: 13.8 10*3/uL — ABNORMAL HIGH (ref 4.0–10.5)
nRBC: 0 % (ref 0.0–0.2)

## 2019-04-09 LAB — I-STAT CHEM 8, ED
BUN: 41 mg/dL — ABNORMAL HIGH (ref 8–23)
Calcium, Ion: 1.2 mmol/L (ref 1.15–1.40)
Chloride: 108 mmol/L (ref 98–111)
Creatinine, Ser: 1.1 mg/dL — ABNORMAL HIGH (ref 0.44–1.00)
Glucose, Bld: 109 mg/dL — ABNORMAL HIGH (ref 70–99)
HCT: 41 % (ref 36.0–46.0)
Hemoglobin: 13.9 g/dL (ref 12.0–15.0)
Potassium: 3.6 mmol/L (ref 3.5–5.1)
Sodium: 143 mmol/L (ref 135–145)
TCO2: 29 mmol/L (ref 22–32)

## 2019-04-09 LAB — COMPREHENSIVE METABOLIC PANEL
ALT: 11 U/L (ref 0–44)
AST: 17 U/L (ref 15–41)
Albumin: 2.8 g/dL — ABNORMAL LOW (ref 3.5–5.0)
Alkaline Phosphatase: 82 U/L (ref 38–126)
Anion gap: 12 (ref 5–15)
BUN: 41 mg/dL — ABNORMAL HIGH (ref 8–23)
CO2: 25 mmol/L (ref 22–32)
Calcium: 9.8 mg/dL (ref 8.9–10.3)
Chloride: 106 mmol/L (ref 98–111)
Creatinine, Ser: 1.13 mg/dL — ABNORMAL HIGH (ref 0.44–1.00)
GFR calc Af Amer: 55 mL/min — ABNORMAL LOW (ref >60)
GFR calc non Af Amer: 47 mL/min — ABNORMAL LOW (ref >60)
Glucose, Bld: 114 mg/dL — ABNORMAL HIGH (ref 70–99)
Potassium: 3.6 mmol/L (ref 3.5–5.1)
Sodium: 143 mmol/L (ref 135–145)
Total Bilirubin: 0.5 mg/dL (ref 0.3–1.2)
Total Protein: 6.6 g/dL (ref 6.5–8.1)

## 2019-04-09 LAB — DIFFERENTIAL
Abs Immature Granulocytes: 0.05 10*3/uL (ref 0.00–0.07)
Basophils Absolute: 0.1 10*3/uL (ref 0.0–0.1)
Basophils Relative: 1 %
Eosinophils Absolute: 0.3 10*3/uL (ref 0.0–0.5)
Eosinophils Relative: 2 %
Immature Granulocytes: 0 %
Lymphocytes Relative: 18 %
Lymphs Abs: 2.4 10*3/uL (ref 0.7–4.0)
Monocytes Absolute: 1.2 10*3/uL — ABNORMAL HIGH (ref 0.1–1.0)
Monocytes Relative: 9 %
Neutro Abs: 9.8 10*3/uL — ABNORMAL HIGH (ref 1.7–7.7)
Neutrophils Relative %: 70 %

## 2019-04-09 LAB — URINALYSIS, ROUTINE W REFLEX MICROSCOPIC
Bacteria, UA: NONE SEEN
Bilirubin Urine: NEGATIVE
Glucose, UA: NEGATIVE mg/dL
Hgb urine dipstick: NEGATIVE
Ketones, ur: NEGATIVE mg/dL
Leukocytes,Ua: NEGATIVE
Nitrite: NEGATIVE
Protein, ur: 30 mg/dL — AB
Specific Gravity, Urine: 1.029 (ref 1.005–1.030)
pH: 6 (ref 5.0–8.0)

## 2019-04-09 LAB — APTT: aPTT: 36 seconds (ref 24–36)

## 2019-04-09 LAB — PROTIME-INR
INR: 1 (ref 0.8–1.2)
Prothrombin Time: 12.7 seconds (ref 11.4–15.2)

## 2019-04-09 MED ORDER — SODIUM CHLORIDE 0.9% FLUSH
3.0000 mL | Freq: Once | INTRAVENOUS | Status: AC
Start: 2019-04-09 — End: 2019-04-09
  Administered 2019-04-09: 3 mL via INTRAVENOUS

## 2019-04-09 MED ORDER — HYDROCODONE-ACETAMINOPHEN 5-325 MG PO TABS
1.0000 | ORAL_TABLET | Freq: Once | ORAL | Status: AC
  Administered 2019-04-09: 1 via ORAL
  Filled 2019-04-09: qty 1

## 2019-04-09 MED ORDER — ASPIRIN 325 MG PO TABS
325.0000 mg | ORAL_TABLET | Freq: Every day | ORAL | Status: DC
  Administered 2019-04-10: 325 mg via ORAL
  Filled 2019-04-09: qty 1

## 2019-04-09 MED ORDER — ENOXAPARIN SODIUM 40 MG/0.4ML ~~LOC~~ SOLN
40.0000 mg | Freq: Every day | SUBCUTANEOUS | Status: DC
  Administered 2019-04-10 – 2019-04-11 (×2): 40 mg via SUBCUTANEOUS
  Filled 2019-04-09 (×2): qty 0.4

## 2019-04-09 MED ORDER — BACITRACIN-NEOMYCIN-POLYMYXIN OINTMENT TUBE
TOPICAL_OINTMENT | Freq: Two times a day (BID) | CUTANEOUS | Status: DC
  Administered 2019-04-10: 1 via TOPICAL
  Filled 2019-04-09: qty 14

## 2019-04-09 MED ORDER — LEVOTHYROXINE SODIUM 112 MCG PO TABS
112.0000 ug | ORAL_TABLET | Freq: Every day | ORAL | Status: DC
  Administered 2019-04-10 – 2019-04-11 (×2): 112 ug via ORAL
  Filled 2019-04-09 (×2): qty 1

## 2019-04-09 MED ORDER — HYDROCODONE-ACETAMINOPHEN 5-325 MG PO TABS
1.0000 | ORAL_TABLET | ORAL | Status: DC | PRN
  Administered 2019-04-10 – 2019-04-11 (×3): 1 via ORAL
  Filled 2019-04-09 (×3): qty 1

## 2019-04-09 MED ORDER — STROKE: EARLY STAGES OF RECOVERY BOOK
Freq: Once | Status: AC
  Filled 2019-04-09: qty 1

## 2019-04-09 MED ORDER — CLOBETASOL PROPIONATE 0.05 % EX OINT
1.0000 "application " | TOPICAL_OINTMENT | Freq: Every day | CUTANEOUS | Status: DC
  Administered 2019-04-10 – 2019-04-11 (×2): 1 via TOPICAL
  Filled 2019-04-09: qty 15

## 2019-04-09 MED ORDER — PAROXETINE HCL 20 MG PO TABS: 20.0000 mg | ORAL_TABLET | Freq: Every day | ORAL | Status: DC

## 2019-04-09 MED ORDER — SODIUM CHLORIDE 0.9 % IV SOLN: INTRAVENOUS | Status: DC

## 2019-04-09 MED ORDER — ACETAMINOPHEN 650 MG RE SUPP: 650.0000 mg | RECTAL | Status: DC | PRN

## 2019-04-09 MED ORDER — KETOROLAC TROMETHAMINE 15 MG/ML IJ SOLN: 15.0000 mg | Freq: Four times a day (QID) | INTRAMUSCULAR | Status: DC | PRN

## 2019-04-09 MED ORDER — IOHEXOL 350 MG/ML SOLN
100.0000 mL | Freq: Once | INTRAVENOUS | Status: AC | PRN
  Administered 2019-04-09: 100 mL via INTRAVENOUS

## 2019-04-09 MED ORDER — VITAMIN D (ERGOCALCIFEROL) 1.25 MG (50000 UNIT) PO CAPS
50000.0000 [IU] | ORAL_CAPSULE | ORAL | Status: DC
  Administered 2019-04-11: 50000 [IU] via ORAL
  Filled 2019-04-09: qty 1

## 2019-04-09 MED ORDER — LINACLOTIDE 145 MCG PO CAPS
145.0000 ug | ORAL_CAPSULE | Freq: Every day | ORAL | Status: DC | PRN
  Filled 2019-04-09: qty 1

## 2019-04-09 MED ORDER — PANTOPRAZOLE SODIUM 40 MG PO TBEC
40.0000 mg | DELAYED_RELEASE_TABLET | Freq: Every day | ORAL | Status: DC
  Administered 2019-04-10 – 2019-04-11 (×2): 40 mg via ORAL
  Filled 2019-04-09 (×2): qty 1

## 2019-04-09 MED ORDER — SODIUM CHLORIDE 0.9 % IV BOLUS
500.0000 mL | Freq: Once | INTRAVENOUS | Status: AC
  Administered 2019-04-09: 500 mL via INTRAVENOUS

## 2019-04-09 NOTE — ED Notes (Signed)
1 unsuccessful attempt at IV

## 2019-04-09 NOTE — ED Provider Notes (Signed)
[facility] EMERGENCY DEPARTMENT Provider Note   CSN: [id number] Arrival date & time: 04/09/19  1248     History Chief Complaint  Patient presents with  . Aphasia    Maria Blackwell is a 79 y.o. female history of CVA 6 years ago, hypertension, COPD.  Also has history of brain aneurysm that has been "stable for several years " per daughter--uncertain of last imaging.  HPI  Patient is 79 year old female with recent discharge from hospital to SNF after surgical discectomy C4-C7 for decompression of spinal cord exiting nerve roots via anterior approach.  Patient was discharged 1/7 after her surgery was conducted 1/5.  Per daughter at bedside she was feeding herself ambulating and doing well after surgery however experience several falls during her stay at the SNF over the past 3 weeks and has been declining in health and progressively more weak over that time.  Per daughter she had no imaging or evaluation after falls.  On Friday this past week daughter states that patient developing slurred speech.  Does not appear to have any other symptoms.  She states she is in pain but denies abdominal pain, chest pain, shortness of breath, nausea or vomiting.  States that it hurts in her legs and back.  She is currently still wearing cervical soft collar.  Per daughter they had discussed with palliative care end-of-life goals as per mother seem to be worsening in condition.  On a follow-up appointment with Dr. Conchita Paris she was recommended to follow-up in emergency department for evaluation to exclude urinary tract infection or other reversible cause of patient's recent frailty.      Past Medical History:  Diagnosis Date  . COPD (chronic obstructive pulmonary disease) (HCC)   . Depression with anxiety   . Hypertension   . Hypothyroid   . Stroke [facility]) 2015    Patient Active Problem List   Diagnosis Date Noted  . Stenosis of cervical spine with myelopathy (HCC) 03/18/2019  .  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 Cervical myelopathy (HCC) 11/12/2018  . Urinary incontinence 10/17/2018  . Gait abnormality 10/17/2018  . Left-sided low back pain with left-sided sciatica 10/17/2018  . Neck pain 10/17/2018  . Constipation 09/09/2018  . Abdominal pain 09/09/2018  . CVA (cerebral infarction) 01/03/2012  . Headache(784.0) 01/03/2012  . Vision changes 01/03/2012  . Hypertension     Past Surgical History:  Procedure Laterality Date  . ABDOMINAL HYSTERECTOMY    . ANTERIOR CERVICAL DECOMP/DISCECTOMY FUSION N/A 03/18/2019   Procedure: ANTERIOR CERVICAL DECOMPRESSION/DISCECTOMY FUSION, CERVICAL FOUR- CERVICAL FIVE, CERVICAL FIVE- CERVICAL SIX, CERVICAL SIX- CERVICAL SEVEN;  Surgeon: Lisbeth Renshaw, MD;  Location: [facility] OR;  Service: Neurosurgery;  Laterality: N/A;  . CHOLECYSTECTOMY    . EYE SURGERY Right   . IR ANGIO INTRA EXTRACRAN SEL COM CAROTID INNOMINATE BILAT MOD SED  09/19/2016  . IR ANGIO VERTEBRAL SEL VERTEBRAL UNI L MOD SED  09/19/2016  . IR US GUIDE VASC ACCESS RIGHT  09/19/2016  . TUMOR REMOVAL       OB History   No obstetric history on file.     Family History  Problem Relation Age of Onset  . Cancer Mother   . Cancer Father   . Colon cancer Neg Hx     Social History   Tobacco Use  . Smoking status: Current Every Day Smoker    Packs/day: 1.00    Types: Cigarettes  . Smokeless tobacco: Never Used  Substance Use Topics  .  Alcohol use: No  . Drug use: No    Home Medications Prior to Admission medications   Medication Sig Start Date End Date Taking? Authorizing Provider  aspirin 325 MG tablet Take 1 tablet (325 mg total) by mouth daily. 03/26/19   Costella, Vista Mink, PA-C  clobetasol ointment (TEMOVATE) 0.53 % Apply 1 application topically daily. 02/16/19   [provider]  gabapentin (NEURONTIN) 100 MG capsule Take 100 mg by mouth. Pt has "old rx"- takes as needed    [provider]    HYDROcodone-acetaminophen (NORCO/VICODIN) 5-325 MG tablet Take 1 tablet by mouth every 4 (four) hours as needed for moderate pain. 03/20/19 03/19/20  Costella, Vista Mink, PA-C  levothyroxine (SYNTHROID) 112 MCG tablet Take 112 mcg by mouth daily before breakfast.  09/18/18   [provider]  linaclotide (LINZESS) 145 MCG CAPS capsule Take 145 mcg by mouth daily as needed (constipation).    [provider]  omeprazole (PRILOSEC) 20 MG capsule Take 20 mg by mouth daily.  08/19/18   [provider]  OXYGEN Inhale 2 L into the lungs at bedtime.    [provider]  PARoxetine (PAXIL) 20 MG tablet Take 20 mg by mouth daily.     [provider]  potassium chloride (KLOR-CON) 20 MEQ packet Take 20 mEq by mouth 2 (two) times daily. 03/20/19   Costella, Vista Mink, PA-C  triamterene-hydrochlorothiazide (DYAZIDE) 37.5-25 MG per capsule Take 1 capsule by mouth daily.    [provider]  Vitamin D, Ergocalciferol, (DRISDOL) 1.25 MG (50000 UT) CAPS capsule Take 50,000 Units by mouth 2 (two) times a week.     [provider]    Allergies    Amoxicillin  Review of Systems   Review of Systems  Constitutional: Negative for chills and fever.  HENT: Negative for congestion.   Eyes: Negative for pain.  Respiratory: Negative for cough and shortness of breath.   Cardiovascular: Negative for chest pain and leg swelling.  Gastrointestinal: Negative for abdominal pain and vomiting.  Genitourinary: Negative for dysuria.  Musculoskeletal: Positive for neck pain. Negative for myalgias.       Neck pain, bilateral leg pain  Skin: Negative for rash.  Neurological: Negative for dizziness and headaches.    Physical Exam Updated Vital Signs BP (!) 127/95   Pulse 96   Temp 98.8 F (37.1 C) (Oral)   Resp 19   SpO2 98%   Physical Exam Vitals and nursing note reviewed.  Constitutional:      General: She is not in acute distress.    Comments: Patient is frail  79 year old woman appears stated age.  Appears weak.  Is in cervical collar.  Is very hard of hearing.  But is able to answer questions are short and simple.  Is able to follow basic commands.  HENT:     Head: Normocephalic and atraumatic.     Nose: Nose normal.     Mouth/Throat:     Comments: Mucous membranes slightly dry Eyes:     General: No scleral icterus.    Extraocular Movements: Extraocular movements intact.     Pupils: Pupils are equal, round, and reactive to light.  Cardiovascular:     Rate and Rhythm: Normal rate and regular rhythm.     Pulses: Normal pulses.     Heart sounds: Normal heart sounds.  Pulmonary:     Effort: Pulmonary effort is normal. No respiratory distress.     Breath sounds: No wheezing.  Abdominal:  Palpations: Abdomen is soft.     Tenderness: There is no abdominal tenderness.  Musculoskeletal:     Cervical back: Normal range of motion.     Right lower leg: No edema.     Left lower leg: No edema.     Comments: Mild generalized tenderness to palpation of bilateral legs.  No focal tenderness.  No calf tenderness.  Negative Homans' sign.  Skin:    General: Skin is warm and dry.     Capillary Refill: Capillary refill takes less than 2 seconds.     Coloration: Skin is not jaundiced.     Findings: No bruising.     Comments: Well-healing anterior neck incision-no signs of infection.  No erythema or tenderness to palpation  Neurological:     Mental Status: She is alert. Mental status is at baseline.     Comments: Neurologically intact.  Specifically cranial nerves appear to be intact with no facial droop.  Slightly slurred speech but is easily understood however appears to be different from her baseline. No sensory deficits.  Patient has 4/5 strength throughout.  No focal weakness.  Psychiatric:        Mood and Affect: Mood normal.        Behavior: Behavior normal.     ED Results / Procedures / Treatments   Labs (all labs ordered are listed, but only  abnormal results are displayed) Labs Reviewed  CBC - Abnormal; Notable for the following components:      Result Value   WBC 13.8 (*)    All other components within normal limits  DIFFERENTIAL - Abnormal; Notable for the following components:   Neutro Abs 9.8 (*)    Monocytes Absolute 1.2 (*)    All other components within normal limits  COMPREHENSIVE METABOLIC PANEL - Abnormal; Notable for the following components:   Glucose, Bld 114 (*)    BUN 41 (*)    Creatinine, Ser 1.13 (*)    Albumin 2.8 (*)    GFR calc non Af Amer 47 (*)    GFR calc Af Amer 55 (*)    All other components within normal limits  I-STAT CHEM 8, ED - Abnormal; Notable for the following components:   BUN 41 (*)    Creatinine, Ser 1.10 (*)    Glucose, Bld 109 (*)    All other components within normal limits  URINE CULTURE  PROTIME-INR  APTT  URINALYSIS, ROUTINE W REFLEX MICROSCOPIC  CBG MONITORING, ED    EKG EKG Interpretation  Date/Time:  Wednesday April 09 2019 12:54:08 EST Ventricular Rate:  108 PR Interval:  128 QRS Duration: 70 QT Interval:  330 QTC Calculation: 442 R Axis:   -81 Text Interpretation: Sinus tachycardia Left anterior fascicular block Possible Lateral infarct , age undetermined Cannot rule out Inferior infarct (masked by fascicular block?) , age undetermined Abnormal ECG background noise Otherwise no significant change Confirmed by Melene Plan [phone]) on 04/09/2019 3:36:42 PM   Radiology DG Chest Portable 1 View  Result Date: 04/09/2019 CLINICAL DATA:  Altered mental status elevated white count EXAM: PORTABLE CHEST 1 VIEW COMPARISON:  07/05/2018 FINDINGS: No focal airspace disease or pleural effusion. Mild cardiomegaly with tortuous aorta. Aortic atherosclerosis. No pneumothorax. IMPRESSION: No active disease. Mild cardiomegaly. Electronically Signed   By: Jasmine Pang M.D.   On: 04/09/2019 15:19    Procedures Procedures (including critical care time)  Medications Ordered in  ED Medications  sodium chloride flush (NS) 0.9 % injection 3 mL (has no  administration in time range)  sodium chloride 0.9 % bolus 500 mL (has no administration in time range)    ED Course  I have reviewed the triage vital signs and the nursing notes.  Pertinent labs & imaging results that were available during my care of the patient were reviewed by me and considered in my medical decision making (see chart for details).    MDM Rules/Calculators/A&P                      Patient is 79 year old female presented with slurred speech since Friday.  She had a stroke window.  She is with daughter at bedside.  She has recently had neck surgery.  She has no signs of meningitis.   Patient does appear clinically dehydrated.  She has slightly dry mucous membranes.  Is mildly tachycardic.  She is afebrile doubt infection.  She has mild leukocytosis which may be hemoconcentrated due to patient's dehydration.  Hemoglobin is within normal limits and will be expected to be mildly decreased due to recent surgery.  And creatinine ratio elevated consistent with prerenal AKI.  Urine pending at this time.  Care transitioned to [facility] PA-C for follow-up on CT imaging of head and discussion with neurosurgery if deemed necessary at the discretion of PA [facility]  5:09 PM   Final Clinical Impression(s) / ED Diagnoses Final diagnoses:  Dysarthria    Rx / DC Orders ED Discharge Orders    None       Gailen Shelter, [place] 04/09/19 1709    Benjiman Core, MD 04/10/19 1013

## 2019-04-09 NOTE — ED Provider Notes (Signed)
Received patient at signout from [facility].  Refer to provider note for full history and physical examination.  Briefly, patient is a 79 year old female presenting from skilled nursing facility with family at the bedside for evaluation of increased falls, generalized weakness for a couple of weeks and dysarthric speech for 5 days.  She is 22 days status post cervical fusion, tolerated the surgery well and was discharged to a SNF 2 days later.  Since then she has had multiple falls, feels generally weak.  She was seen by her neurosurgeon Dr. Kathyrn Sheriff today who recommended presentation to the ED for further evaluation.  She is pending imaging and UA with plan to contact neurosurgery after imaging for further recommendations.  Physical Exam  BP (!) 127/95   Pulse 96   Temp 98.8 F (37.1 C) (Oral)   Resp 19   SpO2 98%   Physical Exam Vitals and nursing note reviewed.  Constitutional:      General: She is not in acute distress.    Appearance: She is well-developed.     Comments: Thin, chronically ill in appearance  HENT:     Head: Normocephalic and atraumatic.     Mouth/Throat:     Mouth: Mucous membranes are dry.  Eyes:     General:        Right eye: No discharge.        Left eye: No discharge.     Conjunctiva/sclera: Conjunctivae normal.  Neck:     Vascular: No JVD.     Trachea: No tracheal deviation.     Comments: Wearing Aspen collar.  Surgical incision appears well-healing with no signs of wound dehiscence or secondary skin infection Cardiovascular:     Rate and Rhythm: Normal rate.  Pulmonary:     Effort: Pulmonary effort is normal.  Abdominal:     General: There is no distension.  Skin:    General: Skin is warm and dry.     Findings: No erythema.  Neurological:     Mental Status: She is alert.     Comments: Dysarthric speech noted  Psychiatric:        Behavior: Behavior normal.     ED Course/Procedures     Procedures  MDM   Labs Reviewed  CBC - Abnormal;  Notable for the following components:      Result Value   WBC 13.8 (*)    All other components within normal limits  DIFFERENTIAL - Abnormal; Notable for the following components:   Neutro Abs 9.8 (*)    Monocytes Absolute 1.2 (*)    All other components within normal limits  COMPREHENSIVE METABOLIC PANEL - Abnormal; Notable for the following components:   Glucose, Bld 114 (*)    BUN 41 (*)    Creatinine, Ser 1.13 (*)    Albumin 2.8 (*)    GFR calc non Af Amer 47 (*)    GFR calc Af Amer 55 (*)    All other components within normal limits  URINALYSIS, ROUTINE W REFLEX MICROSCOPIC - Abnormal; Notable for the following components:   Protein, ur 30 (*)    All other components within normal limits  I-STAT CHEM 8, ED - Abnormal; Notable for the following components:   BUN 41 (*)    Creatinine, Ser 1.10 (*)    Glucose, Bld 109 (*)    All other components within normal limits  URINE CULTURE  SARS CORONAVIRUS 2 (TAT 6-24 HRS)  PROTIME-INR  APTT  HEMOGLOBIN A1C  LIPID  PANEL  CBC  COMPREHENSIVE METABOLIC PANEL  CBG MONITORING, ED   CT Angio Head W or Wo Contrast  Result Date: 04/09/2019 CLINICAL DATA:  Speech disturbance. Dysarthria. History of brain aneurysm. EXAM: CT ANGIOGRAPHY HEAD AND NECK TECHNIQUE: Multidetector CT imaging of the head and neck was performed using the standard protocol during bolus administration of intravenous contrast. Multiplanar CT image reconstructions and MIPs were obtained to evaluate the vascular anatomy. Carotid stenosis measurements (when applicable) are obtained utilizing NASCET criteria, using the distal internal carotid diameter as the denominator. CONTRAST:  OMNIPAQUE IOHEXOL 350 MG/ML SOLN COMPARISON:  MRI 11/07/2018 FINDINGS: CT HEAD FINDINGS Brain: Age related volume loss. Chronic small-vessel ischemic changes of the cerebral hemispheric white matter. No sign of acute infarction, mass lesion, hemorrhage, hydrocephalus or extra-axial collection.  Vascular: There is atherosclerotic calcification of the major vessels at the base of the brain. Skull: Negative Sinuses: Clear Orbits: Normal Review of the MIP images confirms the above findings CTA NECK FINDINGS Aortic arch: Aortic atherosclerosis. Branching pattern is normal without origin stenosis. Right carotid system: Common carotid artery widely patent to the bifurcation. Calcified plaque at the carotid bifurcation and ICA bulb. Minimal diameter in the ICA bulb is 3.5 mm. Compared to a more distal cervical ICA diameter of 5 mm, this indicates a 30% stenosis. Cervical ICA widely patent beyond that. Left carotid system: Common carotid artery widely patent to the bifurcation. Calcified plaque at carotid bifurcation and ICA bulb. No stenosis. Cervical ICA widely patent beyond that. Vertebral arteries: Both vertebral artery origins are widely patent. Both vertebral arteries are widely patent through the cervical region to the foramen magnum. Skeleton: Previous ACDF C4 through C7. Other neck: No mass or lymphadenopathy. Upper chest: Emphysema.  No focal or active process otherwise. Review of the MIP images confirms the above findings CTA HEAD FINDINGS Anterior circulation: Both internal carotid arteries are widely patent through the skull base and siphon regions. There is atherosclerotic calcification in both carotid siphon regions but without stenosis. There are small atherosclerotic aneurysms in the siphon regions bilaterally, none larger than 2 mm. The anterior and middle cerebral vessels are patent. No large or medium vessel occlusion. Posterior circulation: Both vertebral arteries are patent through the foramen magnum to the basilar. No basilar stenosis. Posterior circulation branch vessels are patent. Venous sinuses: Patent and normal. Anatomic variants: None significant. Review of the MIP images confirms the above findings IMPRESSION: No intracranial large or medium vessel occlusion. Advanced aortic  atherosclerotic disease. Atherosclerotic disease of the carotid bifurcation regions. 30% ICA stenosis on the right. No measurable stenosis on the left. 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 Head CT does not show any acute finding. Chronic small-vessel ischemic changes of the white matter. Electronically Signed   By: Paulina Fusi M.D.   On: 04/09/2019 18:55   CT Angio Neck W and/or Wo Contrast  Result Date: 04/09/2019 CLINICAL DATA:  Speech disturbance. Dysarthria. History of brain aneurysm. EXAM: CT ANGIOGRAPHY HEAD AND NECK TECHNIQUE: Multidetector CT imaging of the head and neck was performed using the standard protocol during bolus administration of intravenous contrast. Multiplanar CT image reconstructions and MIPs were obtained to evaluate the vascular anatomy. Carotid stenosis measurements (when applicable) are obtained utilizing NASCET criteria, using the distal internal carotid diameter as the denominator. CONTRAST:  OMNIPAQUE IOHEXOL 350 MG/ML SOLN COMPARISON:  MRI 11/07/2018 FINDINGS: CT HEAD FINDINGS Brain: Age related volume loss. Chronic small-vessel  ischemic changes of the cerebral hemispheric white matter. No sign of acute infarction, mass lesion, hemorrhage, hydrocephalus or extra-axial collection. Vascular: There is atherosclerotic calcification of the major vessels at the base of the brain. Skull: Negative Sinuses: Clear Orbits: Normal Review of the MIP images confirms the above findings CTA NECK FINDINGS Aortic arch: Aortic atherosclerosis. Branching pattern is normal without origin stenosis. Right carotid system: Common carotid artery widely patent to the bifurcation. Calcified plaque at the carotid bifurcation and ICA bulb. Minimal diameter in the ICA bulb is 3.5 mm. Compared to a more distal cervical ICA diameter of 5 mm, this indicates a 30% stenosis. Cervical ICA widely patent beyond that. Left  carotid system: Common carotid artery widely patent to the bifurcation. Calcified plaque at carotid bifurcation and ICA bulb. No stenosis. Cervical ICA widely patent beyond that. Vertebral arteries: Both vertebral artery origins are widely patent. Both vertebral arteries are widely patent through the cervical region to the foramen magnum. Skeleton: Previous ACDF C4 through C7. Other neck: No mass or lymphadenopathy. Upper chest: Emphysema.  No focal or active process otherwise. Review of the MIP images confirms the above findings CTA HEAD FINDINGS Anterior circulation: Both internal carotid arteries are widely patent through the skull base and siphon regions. There is atherosclerotic calcification in both carotid siphon regions but without stenosis. There are small atherosclerotic aneurysms in the siphon regions bilaterally, none larger than 2 mm. The anterior and middle cerebral vessels are patent. No large or medium vessel occlusion. Posterior circulation: Both vertebral arteries are patent through the foramen magnum to the basilar. No basilar stenosis. Posterior circulation branch vessels are patent. Venous sinuses: Patent and normal. Anatomic variants: None significant. Review of the MIP images confirms the above findings IMPRESSION: No intracranial large or medium vessel occlusion. Advanced aortic atherosclerotic disease. Atherosclerotic disease of the carotid bifurcation regions. 30% ICA stenosis on the right. No measurable stenosis on the left. 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 Head CT does not show any acute finding. Chronic small-vessel ischemic changes of the white matter. Electronically Signed   By: Paulina Fusi M.D.   On: 04/09/2019 18:55   MR Brain Wo Contrast (neuro protocol)  Result Date: 04/09/2019 CLINICAL DATA:  History of stroke and brain aneurysm. Speech disturbance. EXAM: MRI HEAD WITHOUT CONTRAST  TECHNIQUE: Multiplanar, multiecho pulse sequences of the brain and surrounding structures were obtained without intravenous contrast. COMPARISON:  CT angiography same day FINDINGS: Brain: Diffusion imaging shows a punctate acute white matter infarction in the right frontal subcortical white matter. Few small foci of acute infarction affecting right parietal gyral surfaces. Punctate acute infarction of the right parietal deep white matter. This is consistent with micro embolic disease in the right MCA territory. No large or confluent acute infarction. There are chronic small-vessel ischemic changes of the pons and cerebellum. Old small vessel infarctions of the thalami and throughout the cerebral hemispheric white matter. There is an old left occipital cortical infarction. No mass, acute hemorrhage, hydrocephalus or extra-axial collection. Vascular: Major vessels at the base of the brain show flow. Skull and upper cervical spine: Negative Sinuses/Orbits: Clear/normal Other: None IMPRESSION: Several punctate acute infarctions scattered in the right MCA territory affecting the right frontal and parietal deep white matter and right parietal gyral surfaces. No swelling or hemorrhage. No large confluent acute infarction. Extensive chronic small-vessel ischemic changes elsewhere throughout the brain. Old left occipital infarction. Electronically Signed   By:  Paulina Fusi M.D.   On: 04/09/2019 21:45   DG Chest Portable 1 View  Result Date: 04/09/2019 CLINICAL DATA:  Altered mental status elevated white count EXAM: PORTABLE CHEST 1 VIEW COMPARISON:  07/05/2018 FINDINGS: No focal airspace disease or pleural effusion. Mild cardiomegaly with tortuous aorta. Aortic atherosclerosis. No pneumothorax. IMPRESSION: No active disease. Mild cardiomegaly. Electronically Signed   By: Jasmine Pang M.D.   On: 04/09/2019 15:19   UA does not suggest UTI or nephrolithiasis.  CTA of the head and neck shows no acute intracranial  abnormality, 30% ICA stenosis on the right, no measurable stenosis on the left.  Diffuse atherosclerosis.  CONSULT: Spoke with PA Meyran with neurosurgery who recommends obtaining MRI for further evaluation.  MRI shows several punctate acute infarction scattered in the right MCA territory affecting the right frontal and parietal deep white matter and right parietal gyral surfaces suggesting embolic disease. Spoke with Dr. Nelda Bucks with [facility] hospitalist service who agrees to assume care of patient and bring her into the hospital for further evaluation and management.  11:44 PM CONSULT: Spoke with Dr. Laurence Slate with neurology, discussed patient presentation and work-up.  He is aware the patient is admitted to the hospitalist service.      Jeanie Sewer, PA-C 04/09/19 2344    Melene Plan, DO 04/09/19 2353

## 2019-04-09 NOTE — ED Notes (Signed)
Pt transported to mri 

## 2019-04-09 NOTE — ED Notes (Signed)
Assisted pt to bedside commode. Pt to CT.  Will start fluids upon her return.

## 2019-04-09 NOTE — ED Triage Notes (Signed)
Pt arrives by pov from [facility] for slurred speech that started on Friday. Per daughter pt recently had surgery on neck and has been at [facility] for rehab daughter reports multiple falls. Pt is very hard of hearing.

## 2019-04-10 ENCOUNTER — Inpatient Hospital Stay (HOSPITAL_COMMUNITY): Payer: Medicare Other

## 2019-04-10 DIAGNOSIS — I10 Essential (primary) hypertension: Secondary | ICD-10-CM | POA: Diagnosis not present

## 2019-04-10 DIAGNOSIS — I6389 Other cerebral infarction: Secondary | ICD-10-CM

## 2019-04-10 DIAGNOSIS — R1310 Dysphagia, unspecified: Secondary | ICD-10-CM | POA: Diagnosis present

## 2019-04-10 DIAGNOSIS — I6340 Cerebral infarction due to embolism of unspecified cerebral artery: Secondary | ICD-10-CM

## 2019-04-10 DIAGNOSIS — I639 Cerebral infarction, unspecified: Secondary | ICD-10-CM

## 2019-04-10 DIAGNOSIS — N179 Acute kidney failure, unspecified: Secondary | ICD-10-CM | POA: Diagnosis not present

## 2019-04-10 DIAGNOSIS — E039 Hypothyroidism, unspecified: Secondary | ICD-10-CM

## 2019-04-10 DIAGNOSIS — G959 Disease of spinal cord, unspecified: Secondary | ICD-10-CM | POA: Diagnosis not present

## 2019-04-10 DIAGNOSIS — J449 Chronic obstructive pulmonary disease, unspecified: Secondary | ICD-10-CM | POA: Diagnosis present

## 2019-04-10 DIAGNOSIS — G992 Myelopathy in diseases classified elsewhere: Secondary | ICD-10-CM

## 2019-04-10 DIAGNOSIS — R262 Difficulty in walking, not elsewhere classified: Secondary | ICD-10-CM

## 2019-04-10 DIAGNOSIS — I472 Ventricular tachycardia: Secondary | ICD-10-CM | POA: Diagnosis not present

## 2019-04-10 DIAGNOSIS — E785 Hyperlipidemia, unspecified: Secondary | ICD-10-CM | POA: Diagnosis present

## 2019-04-10 DIAGNOSIS — Z66 Do not resuscitate: Secondary | ICD-10-CM | POA: Diagnosis present

## 2019-04-10 DIAGNOSIS — F17210 Nicotine dependence, cigarettes, uncomplicated: Secondary | ICD-10-CM | POA: Diagnosis present

## 2019-04-10 DIAGNOSIS — Z20822 Contact with and (suspected) exposure to covid-19: Secondary | ICD-10-CM | POA: Diagnosis present

## 2019-04-10 DIAGNOSIS — H9190 Unspecified hearing loss, unspecified ear: Secondary | ICD-10-CM | POA: Diagnosis present

## 2019-04-10 DIAGNOSIS — R29704 NIHSS score 4: Secondary | ICD-10-CM | POA: Diagnosis present

## 2019-04-10 DIAGNOSIS — I4891 Unspecified atrial fibrillation: Secondary | ICD-10-CM | POA: Diagnosis present

## 2019-04-10 DIAGNOSIS — M542 Cervicalgia: Secondary | ICD-10-CM

## 2019-04-10 DIAGNOSIS — I671 Cerebral aneurysm, nonruptured: Secondary | ICD-10-CM | POA: Diagnosis present

## 2019-04-10 DIAGNOSIS — G9349 Other encephalopathy: Secondary | ICD-10-CM | POA: Diagnosis present

## 2019-04-10 DIAGNOSIS — R296 Repeated falls: Secondary | ICD-10-CM | POA: Diagnosis present

## 2019-04-10 DIAGNOSIS — E860 Dehydration: Secondary | ICD-10-CM | POA: Diagnosis present

## 2019-04-10 DIAGNOSIS — F32A Depression, unspecified: Secondary | ICD-10-CM

## 2019-04-10 DIAGNOSIS — F418 Other specified anxiety disorders: Secondary | ICD-10-CM | POA: Diagnosis present

## 2019-04-10 DIAGNOSIS — F329 Major depressive disorder, single episode, unspecified: Secondary | ICD-10-CM

## 2019-04-10 DIAGNOSIS — M4802 Spinal stenosis, cervical region: Secondary | ICD-10-CM

## 2019-04-10 DIAGNOSIS — Z981 Arthrodesis status: Secondary | ICD-10-CM | POA: Diagnosis not present

## 2019-04-10 DIAGNOSIS — R471 Dysarthria and anarthria: Secondary | ICD-10-CM | POA: Diagnosis present

## 2019-04-10 DIAGNOSIS — Z7982 Long term (current) use of aspirin: Secondary | ICD-10-CM | POA: Diagnosis not present

## 2019-04-10 DIAGNOSIS — I63411 Cerebral infarction due to embolism of right middle cerebral artery: Secondary | ICD-10-CM | POA: Diagnosis present

## 2019-04-10 DIAGNOSIS — R531 Weakness: Secondary | ICD-10-CM | POA: Diagnosis present

## 2019-04-10 DIAGNOSIS — R4701 Aphasia: Secondary | ICD-10-CM | POA: Diagnosis present

## 2019-04-10 LAB — COMPREHENSIVE METABOLIC PANEL
ALT: 11 U/L (ref 0–44)
AST: 15 U/L (ref 15–41)
Albumin: 2.4 g/dL — ABNORMAL LOW (ref 3.5–5.0)
Alkaline Phosphatase: 70 U/L (ref 38–126)
Anion gap: 8 (ref 5–15)
BUN: 34 mg/dL — ABNORMAL HIGH (ref 8–23)
CO2: 25 mmol/L (ref 22–32)
Calcium: 8.8 mg/dL — ABNORMAL LOW (ref 8.9–10.3)
Chloride: 108 mmol/L (ref 98–111)
Creatinine, Ser: 0.97 mg/dL (ref 0.44–1.00)
GFR calc Af Amer: >60 mL/min (ref >60)
GFR calc non Af Amer: 57 mL/min — ABNORMAL LOW (ref >60)
Glucose, Bld: 119 mg/dL — ABNORMAL HIGH (ref 70–99)
Potassium: 3.2 mmol/L — ABNORMAL LOW (ref 3.5–5.1)
Sodium: 141 mmol/L (ref 135–145)
Total Bilirubin: 0.4 mg/dL (ref 0.3–1.2)
Total Protein: 5.6 g/dL — ABNORMAL LOW (ref 6.5–8.1)

## 2019-04-10 LAB — CBC
HCT: 35.5 % — ABNORMAL LOW (ref 36.0–46.0)
Hemoglobin: 11.6 g/dL — ABNORMAL LOW (ref 12.0–15.0)
MCH: 31.1 pg (ref 26.0–34.0)
MCHC: 32.7 g/dL (ref 30.0–36.0)
MCV: 95.2 fL (ref 80.0–100.0)
Platelets: 275 10*3/uL (ref 150–400)
RBC: 3.73 MIL/uL — ABNORMAL LOW (ref 3.87–5.11)
RDW: 13.5 % (ref 11.5–15.5)
WBC: 9.4 10*3/uL (ref 4.0–10.5)
nRBC: 0 % (ref 0.0–0.2)

## 2019-04-10 LAB — URINE CULTURE: Culture: NO GROWTH

## 2019-04-10 LAB — HEMOGLOBIN A1C
Hgb A1c MFr Bld: 5.7 % — ABNORMAL HIGH (ref 4.8–5.6)
Mean Plasma Glucose: 116.89 mg/dL

## 2019-04-10 LAB — MAGNESIUM: Magnesium: 1.8 mg/dL (ref 1.7–2.4)

## 2019-04-10 LAB — LIPID PANEL
Cholesterol: 99 mg/dL (ref 0–200)
HDL: 21 mg/dL — ABNORMAL LOW (ref >40)
LDL Cholesterol: 58 mg/dL (ref 0–99)
Total CHOL/HDL Ratio: 4.7 RATIO
Triglycerides: 100 mg/dL (ref <150)
VLDL: 20 mg/dL (ref 0–40)

## 2019-04-10 LAB — SARS CORONAVIRUS 2 (TAT 6-24 HRS): SARS Coronavirus 2: NEGATIVE

## 2019-04-10 LAB — ECHOCARDIOGRAM COMPLETE

## 2019-04-10 MED ORDER — CLOPIDOGREL BISULFATE 75 MG PO TABS
75.0000 mg | ORAL_TABLET | Freq: Every day | ORAL | Status: DC
  Administered 2019-04-10 – 2019-04-11 (×2): 75 mg via ORAL
  Filled 2019-04-10 (×2): qty 1

## 2019-04-10 MED ORDER — ATORVASTATIN CALCIUM 80 MG PO TABS: 80.0000 mg | ORAL_TABLET | Freq: Every day | ORAL | Status: DC

## 2019-04-10 MED ORDER — ATORVASTATIN CALCIUM 10 MG PO TABS
20.0000 mg | ORAL_TABLET | Freq: Every day | ORAL | Status: DC
  Administered 2019-04-11: 20 mg via ORAL
  Filled 2019-04-10: qty 2

## 2019-04-10 MED ORDER — ASPIRIN EC 81 MG PO TBEC
81.0000 mg | DELAYED_RELEASE_TABLET | Freq: Every day | ORAL | Status: DC
  Administered 2019-04-11: 09:00:00 81 mg via ORAL
  Filled 2019-04-10: qty 1

## 2019-04-10 MED ORDER — POTASSIUM CHLORIDE 10 MEQ/100ML IV SOLN
10.0000 meq | INTRAVENOUS | Status: AC
  Administered 2019-04-10 (×3): 10 meq via INTRAVENOUS
  Filled 2019-04-10 (×3): qty 100

## 2019-04-10 MED ORDER — LORAZEPAM 2 MG/ML IJ SOLN
0.5000 mg | Freq: Once | INTRAMUSCULAR | Status: AC
  Administered 2019-04-10: 0.5 mg via INTRAVENOUS
  Filled 2019-04-10: qty 1

## 2019-04-10 NOTE — NC FL2 (Signed)
[place] LEVEL OF CARE SCREENING TOOL     IDENTIFICATION  Patient Name: Maria Blackwell Birthdate: September 14, 1940 Sex: female Admission Date (Current Location): 04/09/2019  [facility] and [place] Number:  Herbalist and Address:  [facility] [place], [place] [address] [place], [place] [address]      Provider Number: [id number]  Attending Physician Name and Address:  Geradine Girt, DO  Relative Name and Phone Number:       Current Level of Care: Hospital Recommended Level of Care: [place] Prior Approval Number:    Date Approved/Denied:   PASRR Number: [id number]  Discharge Plan: SNF    Current Diagnoses: Patient Active Problem List   Diagnosis Date Noted  . Hypothyroidism 04/10/2019  . Depression 04/10/2019  . CVA (cerebral vascular accident) ([place]) 04/10/2019  . DNR (do not resuscitate) 04/10/2019  . 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 Unable to ambulate 04/09/2019  . AKI (acute kidney injury) ([place]) 04/09/2019  . 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 Cervical myelopathy ([place]) 11/12/2018  . Urinary incontinence 10/17/2018  . Gait abnormality 10/17/2018  . Left-sided low back pain with left-sided sciatica 10/17/2018  . Neck pain 10/17/2018  . Constipation 09/09/2018  . Abdominal pain 09/09/2018  . CVA (cerebrovascular accident) ([place]) 01/03/2012  . Headache(784.0) 01/03/2012  . Vision changes 01/03/2012  . Hypertension     Orientation RESPIRATION BLADDER Height & Weight     Self, Time, Situation, Place  Normal Continent Weight:   Height:     BEHAVIORAL SYMPTOMS/MOOD NEUROLOGICAL BOWEL NUTRITION STATUS      Continent Diet(see DC summary)  AMBULATORY STATUS COMMUNICATION OF NEEDS Skin   Extensive Assist Verbally Surgical wounds(closed neck, honeycomb dressing)                       Personal Care  Assistance Level of Assistance  Bathing, Feeding, Dressing Bathing Assistance: Limited assistance Feeding assistance: Limited assistance Dressing Assistance: Limited assistance     Functional Limitations Info  Sight, Hearing, Speech Sight Info: Adequate Hearing Info: Impaired Speech Info: Adequate    SPECIAL CARE FACTORS FREQUENCY  PT (By licensed PT), OT (By licensed OT), Speech therapy     PT Frequency: 5x/wk OT Frequency: 5x/wk     Speech Therapy Frequency: 5x/wk      Contractures Contractures Info: Not present    Additional Factors Info  Code Status, Allergies, Psychotropic Code Status Info: DNR Allergies Info: Amoxicillin Psychotropic Info: Prozac 20mg  daily at bed         Current Medications (04/10/2019):  This is the current hospital active medication list Current Facility-Administered Medications  Medication Dose Route Frequency Provider Last Rate Last Admin  . 0.9 %  sodium chloride infusion   Intravenous Continuous Eulogio Bear U, DO 50 mL/hr at 04/10/19 1030 New Bag at 04/10/19 1030  . acetaminophen (TYLENOL) suppository 650 mg  650 mg Rectal Q4H PRN Cristescu, Mircea G, MD      . aspirin tablet 325 mg  325 mg Oral Daily Cristescu, Mircea G, MD   325 mg at 04/10/19 1036  . [START ON 04/11/2019] atorvastatin (LIPITOR) tablet 80 mg  80 mg Oral q1800 Cristescu, Mircea G, MD      . clobetasol ointment (TEMOVATE) 5.70 % 1 application  1 application Topical Daily Cristescu, Linard Millers, MD   1 application at 17/79/39 1031  .  enoxaparin (LOVENOX) injection 40 mg  40 mg Subcutaneous Daily Cristescu, Mircea G, MD   40 mg at 04/10/19 1031  . HYDROcodone-acetaminophen (NORCO/VICODIN) 5-325 MG per tablet 1 tablet  1 tablet Oral Q4H PRN Cristescu, Victorino Sparrow, MD   1 tablet at 04/10/19 0157  . ketorolac (TORADOL) 15 MG/ML injection 15 mg  15 mg Intravenous Q6H PRN Cristescu, Mircea G, MD      . levothyroxine (SYNTHROID) tablet 112 mcg  112 mcg Oral QAC breakfast Cristescu, Victorino Sparrow, MD   112 mcg at 04/10/19 1036  . linaclotide (LINZESS) capsule 145 mcg  145 mcg Oral Daily PRN Cristescu, Mircea G, MD      . neomycin-bacitracin-polymyxin (NEOSPORIN) ointment   Topical BID Cristescu, Victorino Sparrow, MD   1 application at 04/10/19 1035  . pantoprazole (PROTONIX) EC tablet 40 mg  40 mg Oral Daily Cristescu, Mircea G, MD   40 mg at 04/10/19 1035  . potassium chloride 10 mEq in 100 mL IVPB  10 mEq Intravenous Q1 Hr x 3 Vann, Jessica U, DO 100 mL/hr at 04/10/19 1033 10 mEq at 04/10/19 1033  . [START ON 04/11/2019] Vitamin D (Ergocalciferol) (DRISDOL) capsule 50,000 Units  50,000 Units Oral Once per day on Tue Fri Cristescu, Victorino Sparrow, MD         Discharge Medications: Please see discharge summary for a list of discharge medications.  Relevant Imaging Results:  Relevant Lab Results:   Additional Information SS#: [id number]  Baldemar Lenis, LCSW

## 2019-04-10 NOTE — Progress Notes (Signed)
Pt arrived to the unit with the yellow "DO NOT RESUSCITATE  ORDER" paper.  Epic states Pt is Full Code.  Notified Physician on call of this discrepantly about Pt's code status.

## 2019-04-10 NOTE — Progress Notes (Signed)
STROKE TEAM PROGRESS NOTE   INTERVAL HISTORY No family at bedside. Pt lethargic and sleepy, but able to open eyes with repetitive stimulation. Still on C-collar. Orientated to self, age and place, but not to time. No aphasia, but mild dysarthria. Moving extremities symmetrically. No significant focal deficit on exam.   Vitals:   04/10/19 0423 04/10/19 0505 04/10/19 0855 04/10/19 0907  BP: (!) 127/93  (!) 154/126 (!) 148/88  Pulse: 84 90 92   Resp: 17  19   Temp: 97.7 F (36.5 C)  (!) 96.6 F (35.9 C)   TempSrc: Oral  Axillary   SpO2: (!) 89% 99% 100%     CBC:  Recent Labs  Lab 04/09/19 1256 04/09/19 1256 04/09/19 1305 04/10/19 0307  WBC 13.8*  --   --  9.4  NEUTROABS 9.8*  --   --   --   HGB 13.8   < > 13.9 11.6*  HCT 43.9   < > 41.0 35.5*  MCV 97.8  --   --  95.2  PLT 328  --   --  275   < > = values in this interval not displayed.    Basic Metabolic Panel:  Recent Labs  Lab 04/09/19 1256 04/09/19 1256 04/09/19 1305 04/10/19 0307  NA 143   < > 143 141  K 3.6   < > 3.6 3.2*  CL 106   < > 108 108  CO2 25  --   --  25  GLUCOSE 114*   < > 109* 119*  BUN 41*   < > 41* 34*  CREATININE 1.13*   < > 1.10* 0.97  CALCIUM 9.8  --   --  8.8*  MG  --   --   --  1.8   < > = values in this interval not displayed.   Lipid Panel:     Component Value Date/Time   CHOL 99 04/10/2019 0307   TRIG 100 04/10/2019 0307   HDL 21 (L) 04/10/2019 0307   CHOLHDL 4.7 04/10/2019 0307   VLDL 20 04/10/2019 0307   LDLCALC 58 04/10/2019 0307   HgbA1c:  Lab Results  Component Value Date   HGBA1C 5.7 (H) 04/10/2019   Urine Drug Screen: No results found for: LABOPIA, COCAINSCRNUR, LABBENZ, AMPHETMU, THCU, LABBARB  Alcohol Level No results found for: ETH  IMAGING past 48 hours CT Angio Head W or Wo Contrast  Result Date: 04/09/2019 CLINICAL DATA:  Speech disturbance. Dysarthria. History of brain aneurysm. EXAM: CT ANGIOGRAPHY HEAD AND NECK TECHNIQUE: Multidetector CT imaging of the  head and neck was performed using the standard protocol during bolus administration of intravenous contrast. Multiplanar CT image reconstructions and MIPs were obtained to evaluate the vascular anatomy. Carotid stenosis measurements (when applicable) are obtained utilizing NASCET criteria, using the distal internal carotid diameter as the denominator. CONTRAST:  125mL OMNIPAQUE IOHEXOL 350 MG/ML SOLN COMPARISON:  MRI 11/07/2018 FINDINGS: CT HEAD FINDINGS Brain: Age related volume loss. Chronic small-vessel ischemic changes of the cerebral hemispheric white matter. No sign of acute infarction, mass lesion, hemorrhage, hydrocephalus or extra-axial collection. Vascular: There is atherosclerotic calcification of the major vessels at the base of the brain. Skull: Negative Sinuses: Clear Orbits: Normal Review of the MIP images confirms the above findings CTA NECK FINDINGS Aortic arch: Aortic atherosclerosis. Branching pattern is normal without origin stenosis. Right carotid system: Common carotid artery widely patent to the bifurcation. Calcified plaque at the carotid bifurcation and ICA bulb. Minimal diameter in the ICA  bulb is 3.5 mm. Compared to a more distal cervical ICA diameter of 5 mm, this indicates a 30% stenosis. Cervical ICA widely patent beyond that. Left carotid system: Common carotid artery widely patent to the bifurcation. Calcified plaque at carotid bifurcation and ICA bulb. No stenosis. Cervical ICA widely patent beyond that. Vertebral arteries: Both vertebral artery origins are widely patent. Both vertebral arteries are widely patent through the cervical region to the foramen magnum. Skeleton: Previous ACDF C4 through C7. Other neck: No mass or lymphadenopathy. Upper chest: Emphysema.  No focal or active process otherwise. Review of the MIP images confirms the above findings CTA HEAD FINDINGS Anterior circulation: Both internal carotid arteries are widely patent through the skull base and siphon regions.  There is atherosclerotic calcification in both carotid siphon regions but without stenosis. There are small atherosclerotic aneurysms in the siphon regions bilaterally, none larger than 2 mm. The anterior and middle cerebral vessels are patent. No large or medium vessel occlusion. Posterior circulation: Both vertebral arteries are patent through the foramen magnum to the basilar. No basilar stenosis. Posterior circulation branch vessels are patent. Venous sinuses: Patent and normal. Anatomic variants: None significant. Review of the MIP images confirms the above findings IMPRESSION: No intracranial large or medium vessel occlusion. Advanced aortic atherosclerotic disease. Atherosclerotic disease of the carotid bifurcation regions. 30% ICA stenosis on the right. No measurable stenosis on the left. 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 Head CT does not show any acute finding. Chronic small-vessel ischemic changes of the white matter. Electronically Signed   By: Paulina Fusi M.D.   On: 04/09/2019 18:55   CT Angio Neck W and/or Wo Contrast  Result Date: 04/09/2019 CLINICAL DATA:  Speech disturbance. Dysarthria. History of brain aneurysm. EXAM: CT ANGIOGRAPHY HEAD AND NECK TECHNIQUE: Multidetector CT imaging of the head and neck was performed using the standard protocol during bolus administration of intravenous contrast. Multiplanar CT image reconstructions and MIPs were obtained to evaluate the vascular anatomy. Carotid stenosis measurements (when applicable) are obtained utilizing NASCET criteria, using the distal internal carotid diameter as the denominator. CONTRAST:  OMNIPAQUE IOHEXOL 350 MG/ML SOLN COMPARISON:  MRI 11/07/2018 FINDINGS: CT HEAD FINDINGS Brain: Age related volume loss. Chronic small-vessel ischemic changes of the cerebral hemispheric white matter. No sign of acute infarction, mass lesion, hemorrhage,  hydrocephalus or extra-axial collection. Vascular: There is atherosclerotic calcification of the major vessels at the base of the brain. Skull: Negative Sinuses: Clear Orbits: Normal Review of the MIP images confirms the above findings CTA NECK FINDINGS Aortic arch: Aortic atherosclerosis. Branching pattern is normal without origin stenosis. Right carotid system: Common carotid artery widely patent to the bifurcation. Calcified plaque at the carotid bifurcation and ICA bulb. Minimal diameter in the ICA bulb is 3.5 mm. Compared to a more distal cervical ICA diameter of 5 mm, this indicates a 30% stenosis. Cervical ICA widely patent beyond that. Left carotid system: Common carotid artery widely patent to the bifurcation. Calcified plaque at carotid bifurcation and ICA bulb. No stenosis. Cervical ICA widely patent beyond that. Vertebral arteries: Both vertebral artery origins are widely patent. Both vertebral arteries are widely patent through the cervical region to the foramen magnum. Skeleton: Previous ACDF C4 through C7. Other neck: No mass or lymphadenopathy. Upper chest: Emphysema.  No focal or active process otherwise. Review of the MIP images confirms the above findings CTA HEAD FINDINGS Anterior circulation: Both internal carotid arteries  are widely patent through the skull base and siphon regions. There is atherosclerotic calcification in both carotid siphon regions but without stenosis. There are small atherosclerotic aneurysms in the siphon regions bilaterally, none larger than 2 mm. The anterior and middle cerebral vessels are patent. No large or medium vessel occlusion. Posterior circulation: Both vertebral arteries are patent through the foramen magnum to the basilar. No basilar stenosis. Posterior circulation branch vessels are patent. Venous sinuses: Patent and normal. Anatomic variants: None significant. Review of the MIP images confirms the above findings IMPRESSION: No intracranial large or medium  vessel occlusion. Advanced aortic atherosclerotic disease. Atherosclerotic disease of the carotid bifurcation regions. 30% ICA stenosis on the right. No measurable stenosis on the left. 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 Head CT does not show any acute finding. Chronic small-vessel ischemic changes of the white matter. Electronically Signed   By: Paulina FusiMark  Shogry M.D.   On: 04/09/2019 18:55   MR Brain Wo Contrast (neuro protocol)  Result Date: 04/09/2019 CLINICAL DATA:  History of stroke and brain aneurysm. Speech disturbance. EXAM: MRI HEAD WITHOUT CONTRAST TECHNIQUE: Multiplanar, multiecho pulse sequences of the brain and surrounding structures were obtained without intravenous contrast. COMPARISON:  CT angiography same day FINDINGS: Brain: Diffusion imaging shows a punctate acute white matter infarction in the right frontal subcortical white matter. Few small foci of acute infarction affecting right parietal gyral surfaces. Punctate acute infarction of the right parietal deep white matter. This is consistent with micro embolic disease in the right MCA territory. No large or confluent acute infarction. There are chronic small-vessel ischemic changes of the pons and cerebellum. Old small vessel infarctions of the thalami and throughout the cerebral hemispheric white matter. There is an old left occipital cortical infarction. No mass, acute hemorrhage, hydrocephalus or extra-axial collection. Vascular: Major vessels at the base of the brain show flow. Skull and upper cervical spine: Negative Sinuses/Orbits: Clear/normal Other: None IMPRESSION: Several punctate acute infarctions scattered in the right MCA territory affecting the right frontal and parietal deep white matter and right parietal gyral surfaces. No swelling or hemorrhage. No large confluent acute infarction. Extensive chronic small-vessel ischemic changes  elsewhere throughout the brain. Old left occipital infarction. Electronically Signed   By: Paulina FusiMark  Shogry M.D.   On: 04/09/2019 21:45   DG Chest Portable 1 View  Result Date: 04/09/2019 CLINICAL DATA:  Altered mental status elevated white count EXAM: PORTABLE CHEST 1 VIEW COMPARISON:  07/05/2018 FINDINGS: No focal airspace disease or pleural effusion. Mild cardiomegaly with tortuous aorta. Aortic atherosclerosis. No pneumothorax. IMPRESSION: No active disease. Mild cardiomegaly. Electronically Signed   By: Jasmine PangKim  Fujinaga M.D.   On: 04/09/2019 15:19   ECHOCARDIOGRAM COMPLETE  Result Date: 04/10/2019   ECHOCARDIOGRAM REPORT   Patient Name:   Maria Blackwell Date of Exam: 04/10/2019 Medical Rec #:  [id number]        Height:       62.0 in Accession #:    [id number]       Weight:       138.0 lb Date of Birth:  04/19/1940        BSA:          1.63 m Patient Age:    78 years         BP:           127/93 mmHg Patient Gender: F  HR:           90 bpm. Exam Location:  Inpatient Procedure: 2D Echo Indications:    Stroke  History:        Patient has prior history of Echocardiogram examinations, most                 recent 12/04/2018.  Sonographer:    Ross Ludwig RDCS (AE) Referring Phys: [id number] [facility]  Sonographer Comments: Suboptimal parasternal window. Image acquisition challenging due to patient behavioral factors. and Image acquisition challenging due to uncooperative patient. Unable to complete echo due to patient pushing and hitting my left arm during echo. IMPRESSIONS  1. Left ventricular ejection fraction, by visual estimation, is 60 to 65%. The left ventricle has normal function. There is no left ventricular hypertrophy.  2. The left ventricle has no regional wall motion abnormalities.  3. Incomplete study due to lack of patient cooperation. Visualized segments (anteroseptal and inferolateral myocardium) demonstrated normal systolic function.  4. Global right ventricle was not assessed.The  right ventricular size is not assessed. Right vetricular wall thickness was not assessed.  5. Left atrial size was normal.  6. Right atrial size was not well visualized.  7. The mitral valve is normal in structure. No evidence of mitral valve regurgitation. No evidence of mitral stenosis.  8. The tricuspid valve is not assessed.  9. The tricuspid valve is not assessed. Tricuspid valve regurgitation is not demonstrated. 10. The aortic valve was not assessed. Aortic valve regurgitation is not visualized. No evidence of aortic valve sclerosis or stenosis. 11. The pulmonic valve was not assessed. Pulmonic valve regurgitation is not visualized. 12. The aortic root was not well visualized, the ascending aorta was not well visualized and the aortic arch was not well visualized. 13. TR signal is inadequate for assessing pulmonary artery systolic pressure. 14. The interatrial septum was not assessed. FINDINGS  Left Ventricle: Left ventricular ejection fraction, by visual estimation, is 60 to 65%. The left ventricle has normal function. The left ventricle has no regional wall motion abnormalities. There is no left ventricular hypertrophy. Normal left atrial pressure. Incomplete study due to lack of patient cooperation. Visualized segments (anteroseptal and inferolateral myocardium) demonstrated normal systolic function. Right Ventricle: The right ventricular size is not assessed. Right vetricular wall thickness was not assessed. Global RV systolic function is was not assessed. Left Atrium: Left atrial size was normal in size. Right Atrium: Right atrial size was not well visualized Pericardium: There is no evidence of pericardial effusion. Mitral Valve: The mitral valve is normal in structure. No evidence of mitral valve regurgitation. No evidence of mitral valve stenosis by observation. Tricuspid Valve: The tricuspid valve is not assessed. Tricuspid valve regurgitation is not demonstrated. Aortic Valve: The aortic valve was  not assessed. Aortic valve regurgitation is not visualized. The aortic valve is structurally normal, with no evidence of sclerosis or stenosis. Pulmonic Valve: The pulmonic valve was not assessed. Pulmonic valve regurgitation is not visualized. Pulmonic regurgitation is not visualized. Aorta: The aortic root was not well visualized, the ascending aorta was not well visualized and the aortic arch was not well visualized. Venous: The inferior vena cava was not well visualized. IAS/Shunts: The interatrial septum was not assessed. There is no evidence of a patent foramen ovale.  Chilton Si MD Electronically signed by Chilton Si MD Signature Date/Time: 04/10/2019/11:49:24 AM    Final     PHYSICAL EXAM  Temp:  [96.6 F (35.9 C)-98.3 F (36.8 C)] 97.7 F (  36.5 C) (01/28 1400) Pulse Rate:  [84-107] 90 (01/28 1400) Resp:  [13-21] 20 (01/28 1400) BP: (127-154)/(82-126) 142/82 (01/28 1400) SpO2:  [89 %-100 %] 100 % (01/28 1400)  General - Well nourished, well developed, sleepy and lethargic, on C-collar.  Ophthalmologic - fundi not visualized due to noncooperation.  Cardiovascular - Regular rhythm and rate.  Neuro - drowsy sleepy but able to open eyes on repetitive stimulation. Orientated to self, age, place but not to time. Paucity of speech, no aphasia and able to follow simple commands. Able to name and repeat. Mild dysarthria. No significant facial droop. Tongue midline. BUE 4/5 and BLE 3/5. Sensation symmetric, FTN slow but no ataxia. Gait not tested.    ASSESSMENT/PLAN [name] is a 79 y.o. female with history of HTN, COPD, CVA in 2013, recent C-spine surgery 1/5 presenting with slurred speech and trouble swallowing x 5 days and generalized weakness associated with multiple falls.   Stroke:  Several punctate R MCA territory infarcts in setting of old L occipital infarct, felt to be embolic secondary to unknown source  CT head No acute abnormality. Small vessel disease.      CTA head & neck no LVO. Advanced Aortic and ICA bifurcation and siphon atherosclerosis. R ICA 30% stenosis. 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  MRI  Several punctate R MCA territory infarcts. Extensive small vessel disease. Old L occipital infarct.  LE Doppler no DVT  2D Echo EF 60-65%. No source of embolus   EP cardiology consulted to consider placement of loop recorder tomorrow to look for atrial fibrillation as source of strokes.  LDL 58  HgbA1c 5.7  Lovenox 40 mg sq daily for VTE prophylaxis  aspirin 325 mg daily prior to admission, now on aspirin 81 mg daily and clopidogrel 75 mg daily. Continue DAPT x 3 weeks then plavix alone    Therapy recommendations:  SNF  Disposition:  pending  (admitted from SNF)  Hx stroke/TIA  2013 - pontine infarct d/t small vessel disease. No residual deficits. On ASA  Hypertension  Home meds:  None listed  Stable . Permissive hypertension (OK if < 220/120) but gradually normalize in 5-7 days . Long-term BP goal normotensive  Hyperlipidemia  Home meds:  No statin  Now on lipitor 20 - will not use high intensity statin given advanced age and low LDL  LDL 58, goal < 70  Continue statin at discharge  Dysphagia . Most likely secondary to stroke . On pureed diet w/ nectar thick liquids at SNF . passed YALE this admission  . SLP cleared for D1 with nectar thick liquids and meds in puree/thick liquids . 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 Terrace Arabia  AKI d/t poor po intake  Hypothyroidism  Depression    Hospital day # 0  Marvel Plan, MD PhD Stroke Neurology 04/10/2019 8:28 PM    To contact Stroke Continuity provider, please refer to [email or web address]. After  hours, contact General Neurology

## 2019-04-10 NOTE — Progress Notes (Signed)
Lower extremity venous has been completed.   Preliminary results in CV Proc.   Blanch Media 04/10/2019 3:37 PM

## 2019-04-10 NOTE — Evaluation (Signed)
Occupational Therapy Evaluation Patient Details Name: Maria Blackwell MRN: [id number] DOB: 1940/12/22 Today's Date: 04/10/2019    History of Present Illness Maria Blackwell is a 79 y.o. female with past medical history significant for hypertension, COPD prior stroke with recent C-spine surgery presents to the emergency department from [place] with slurred speech and generalized weakness associated with multiple falls. MRI brain was performed which showed punctate acute infarctions across the right MCA territory.   Clinical Impression   Pt presenting to OT with increased lethargy and fatigue, limiting her participation in ADLs functionally. Max A +2 for bed mobility this session. Pt requiring total A for ADLs at this time, OT will continue to follow to assess change in function and advance transfers. SNF remains appropriate d/c 2/2 high burden of care.     Follow Up Recommendations  SNF;Supervision/Assistance - 24 hour    Equipment Recommendations  None recommended by OT       Precautions / Restrictions Precautions Precautions: Fall;Cervical Precaution Comments: no recall of precautions Required Braces or Orthoses: Cervical Brace Cervical Brace: Hard collar;At all times Restrictions Weight Bearing Restrictions: No      Mobility Bed Mobility Overal bed mobility: Needs Assistance Bed Mobility: Rolling;Sidelying to Sit;Sit to Sidelying Rolling: Max assist Sidelying to sit: Max assist;+2 for physical assistance     Sit to sidelying: Min assist General bed mobility comments: Pt requiring maxA for initiation to roll to right/left; maxA + 2 to sit edge of bed, pt very resistant; pt able to initiate progression back into sidelying, assist provided for repositioning  Transfers                 General transfer comment: deferred due to lethargy    Balance Overall balance assessment: Needs assistance Sitting-balance support: No upper extremity supported;Feet supported Sitting  balance-Leahy Scale: Poor Sitting balance - Comments: Requiring modA to sit upright, right lateral lean; difficult to determine if pt was just trying to lie back down Postural control: Right lateral lean                                 ADL either performed or assessed with clinical judgement   ADL Overall ADL's : Needs assistance/impaired Eating/Feeding: NPO   Grooming: Wash/dry face;Brushing hair;Sitting;Total assistance   Upper Body Bathing: Total assistance   Lower Body Bathing: Total assistance   Upper Body Dressing : Total assistance   Lower Body Dressing: Total assistance   Toilet Transfer: Total assistance   Toileting- Clothing Manipulation and Hygiene: Total assistance       Functional mobility during ADLs: +2 for physical assistance;Maximal assistance General ADL Comments: Pt lethargic and unable to follow most commands- difficult to discern effects of medication vs true cognitive deficits                  Pertinent Vitals/Pain Pain Assessment: Faces Faces Pain Scale: Hurts a little bit Pain Location: grimacing with repositioning Pain Descriptors / Indicators: Grimacing Pain Intervention(s): Limited activity within patient's tolerance     Hand Dominance Left   Extremity/Trunk Assessment Upper Extremity Assessment Upper Extremity Assessment: Generalized weakness;Difficult to assess due to impaired cognition   Lower Extremity Assessment Lower Extremity Assessment: Defer to PT evaluation RLE Deficits / Details: At least 3/5 LLE Deficits / Details: At least 3/5   Cervical / Trunk Assessment Cervical / Trunk Assessment: Other exceptions Cervical / Trunk Exceptions: s/p ACDF   Communication Communication  Communication: HOH   Cognition Arousal/Alertness: Lethargic Behavior During Therapy: Restless Overall Cognitive Status: History of cognitive impairments - at baseline Area of Impairment: Orientation;Attention;Memory;Following  commands;Safety/judgement;Awareness;Problem solving                 Orientation Level: Disoriented to;Place;Time;Situation Current Attention Level: Focused Memory: Decreased short-term memory;Decreased recall of precautions Following Commands: Follows one step commands inconsistently Safety/Judgement: Decreased awareness of safety;Decreased awareness of deficits Awareness: Intellectual Problem Solving: Slow processing;Difficulty sequencing;Decreased initiation;Requires verbal cues;Requires tactile cues General Comments: Pt with decreased arousal; cognition likely impacted by Ativan. Following one step commands ~25-50% of the time, stating she was "in her bed," when asked where she was.    General Comments  Pt lethargic throughout session. Did tell therapists "I'm gonna throw you out that window" when assisted in sitting EOB. Pt intermittently conversing with therapists appropriately but overall keeping eyes closed and resistant to movement            Home Living Family/patient expects to be discharged to:: Skilled nursing facility                                        Prior Functioning/Environment Level of Independence: Needs assistance  Gait / Transfers Assistance Needed: As of 03/19/2018, pt ambulating 100 ft with RW at a min guard assist level. However, pt arrived to [facility] with wheelchair and hoyer lift from SNF ADL's / Homemaking Assistance Needed: Likely requiring assist for ADL's at SNF   Comments: Pt too lethargic to provide information re PLFO        OT Problem List: Decreased strength;Decreased range of motion;Decreased activity tolerance;Impaired balance (sitting and/or standing);Decreased coordination;Decreased cognition;Decreased safety awareness;Decreased knowledge of use of DME or AE;Decreased knowledge of precautions;Pain;Impaired UE functional use      OT Treatment/Interventions: Self-care/ADL training;DME and/or AE instruction;Therapeutic  activities;Cognitive remediation/compensation;Balance training;Patient/family education    OT Goals(Current goals can be found in the care plan section) Acute Rehab OT Goals Patient Stated Goal: to lie back down OT Goal Formulation: Patient unable to participate in goal setting Time For Goal Achievement: 05/01/19 Potential to Achieve Goals: Fair  OT Frequency: Min 2X/week   Barriers to D/C: Decreased caregiver support          Co-evaluation PT/OT/SLP Co-Evaluation/Treatment: Yes Reason for Co-Treatment: Complexity of the patient's impairments (multi-system involvement);For patient/therapist safety;To address functional/ADL transfers PT goals addressed during session: Mobility/safety with mobility OT goals addressed during session: ADL's and self-care;Strengthening/ROM      AM-PAC OT "6 Clicks" Daily Activity     Outcome Measure Help from another person eating meals?: Total Help from another person taking care of personal grooming?: Total Help from another person toileting, which includes using toliet, bedpan, or urinal?: Total Help from another person bathing (including washing, rinsing, drying)?: Total Help from another person to put on and taking off regular upper body clothing?: Total Help from another person to put on and taking off regular lower body clothing?: Total 6 Click Score: 6   End of Session Equipment Utilized During Treatment: Oxygen Nurse Communication: Mobility status  Activity Tolerance: Patient limited by lethargy Patient left: in bed;with call bell/phone within reach;with bed alarm set  OT Visit Diagnosis: Muscle weakness (generalized) (M62.81);History of falling (Z91.81);Cognitive communication deficit (R41.841)                Time: 5329-9242 OT Time Calculation (min): 24 min Charges:  OT  General Charges $OT Visit: 1 Visit OT Evaluation $OT Eval Moderate Complexity: 1 Mod   Curtis Sites OTR/L  04/10/2019, 9:38 AM

## 2019-04-10 NOTE — Evaluation (Signed)
Clinical/Bedside Swallow Evaluation Patient Details  Name: Maria Blackwell MRN: [id number] Date of Birth: [birth year]  Today's Date: 04/10/2019 Time: SLP Start Time (ACUTE ONLY): 1035 SLP Stop Time (ACUTE ONLY): 1100 SLP Time Calculation (min) (ACUTE ONLY): 25 min  Past Medical History:  Past Medical History:  Diagnosis Date  . COPD (chronic obstructive pulmonary disease) ([place])   . Depression with anxiety   . Hypertension   . Hypothyroid   . Stroke [facility]) 2015   Past Surgical History:  Past Surgical History:  Procedure Laterality Date  . ABDOMINAL HYSTERECTOMY    . ANTERIOR CERVICAL DECOMP/DISCECTOMY FUSION N/A 03/18/2019   Procedure: ANTERIOR CERVICAL DECOMPRESSION/DISCECTOMY FUSION, CERVICAL FOUR- CERVICAL FIVE, CERVICAL FIVE- CERVICAL SIX, CERVICAL SIX- CERVICAL SEVEN;  Surgeon: Consuella Lose, MD;  Location: [place];  Service: Neurosurgery;  Laterality: N/A;  . CHOLECYSTECTOMY    . EYE SURGERY Right   . IR ANGIO INTRA EXTRACRAN SEL COM CAROTID INNOMINATE BILAT MOD SED  09/19/2016  . IR ANGIO VERTEBRAL SEL VERTEBRAL UNI L MOD SED  09/19/2016  . IR US GUIDE VASC ACCESS RIGHT  09/19/2016  . TUMOR REMOVAL     HPI:  79 y.o. female admitted 04/09/19 with generalized weakness, multiple falls, intermittent slurred speech, dysphagia. PMH: HTN, COPD, CVA (2015), C-spine surgery 03/18/19. MRI brain was performed which showed punctate acute infarctions across the right MCA territory.   Assessment / Plan / Recommendation Clinical Impression  Pt was seen at bedside for swallow evaluation. Pt was lethargic due to Ativan, however, RN needed to provide po medications. Pt has Aspen collar in place. She is edentulous upper, and is missing teeth lower. Teeth that are present are in poor condition. Pt voice quality is clear but low in intensity. She has difficulty following commands, possibly due to lethargy. Pt tolerated trials of nectar thick liquid via cup, with one slight cough after multiple  presentations. Puree consistency trials were tolerated without overt s/s aspiration. Given reduced alertness, thin liquids and solid textures were not given at this time. Will expand PO trials when pt is more alert.   Recommend continuing with puree and and nectar thick liquids, meds whole with thickened liquid or puree. Safe swallow precautions posted at [facility]. SLP will follow for diet tolerance and readiness to advance.   SLE deferred today, given lethargy.    SLP Visit Diagnosis: Dysphagia, unspecified (R13.10)    Aspiration Risk  Moderate aspiration risk    Diet Recommendation Dysphagia 1 (Puree);Nectar-thick liquid   Liquid Administration via: Cup Medication Administration: (whole meds with nectar thick liquid or puree) Supervision: Full supervision/cueing for compensatory strategies;Staff to assist with self feeding Compensations: Slow rate;Small sips/bites;Minimize environmental distractions Postural Changes: Seated upright at 90 degrees;Remain upright for at least 30 minutes after po intake    Other  Recommendations Oral Care Recommendations: Oral care BID Other Recommendations: Have oral suction available;Order thickener from pharmacy   Follow up Recommendations Skilled Nursing facility      Frequency and Duration min 2x/week  1 week;2 weeks       Prognosis Prognosis for Safe Diet Advancement: Fair      Swallow Study   General Date of Onset: 04/09/19 HPI: 79 y.o. female admitted 04/09/19 with generalized weakness, multiple falls, intermittent slurred speech, dysphagia. PMH: HTN, COPD, CVA (2015), C-spine surgery 03/18/19. MRI brain was performed which showed punctate acute infarctions across the right MCA territory. Type of Study: Bedside Swallow Evaluation Previous Swallow Assessment: none found Diet Prior to this Study: Dysphagia 1 (puree);Nectar-thick  liquids Temperature Spikes Noted: No Respiratory Status: Nasal cannula History of Recent Intubation:  No Behavior/Cognition: Lethargic/Drowsy;Cooperative;Requires cueing Oral Cavity Assessment: Within Functional Limits Oral Care Completed by SLP: No Oral Cavity - Dentition: Missing dentition;Poor condition Vision: (pt kept eyes closed most of the time) Self-Feeding Abilities: Total assist Patient Positioning: Upright in bed Baseline Vocal Quality: Low vocal intensity Volitional Cough: Cognitively unable to elicit Volitional Swallow: Unable to elicit    Oral/Motor/Sensory Function Overall Oral Motor/Sensory Function: Generalized oral weakness   Ice Chips Ice chips: Not tested   Thin Liquid Thin Liquid: Not tested    Nectar Thick Nectar Thick Liquid: Within functional limits Presentation: Cup Other Comments: one slight cough noted after multiple presentations. Pt unable to demonstrate ability to use straw   Honey Thick Honey Thick Liquid: Not tested   Puree Puree: Within functional limits Presentation: Spoon   Solid     Solid: Not tested     Ainslee Sou B. Murvin Natal, [facility], CCC-SLP Speech Language Pathologist Office: [phone] Pager: [phone]   Leigh Aurora 04/10/2019,11:15 AM

## 2019-04-10 NOTE — Consult Note (Addendum)
° ° °ELECTROPHYSIOLOGY CONSULT NOTE  °Patient ID: Maria Blackwell °MRN: [id number], DOB/AGE: 77/29/1942  ° °Admit date: 04/09/2019 °Date of Consult: 04/11/2019 ° °Primary Physician: Sanders, Kirk S, MD °Primary Electrophysiologist: New to Dr. Nou Chard °Reason for Consultation: Cryptogenic stroke; recommendations regarding Implantable Loop Recorder °Insurance: Medicare ° °History of Present Illness °EP has been asked to evaluate Maria Blackwell for placement of an implantable loop recorder to monitor for atrial fibrillation by Dr Xu.  The patient was admitted on 04/09/2019 with slurred speech, generalized weakness, and multiple falls. Last normal approx 5 days ago. Of note, she had C-spine surgery on 03/18/19 for multiple cervical stenoses during work up for gait instability. MRI this admission with punctate acute infarctions across the right MCA territory. They have undergone workup for stroke including echocardiogram and CTA Head and Neck with advanced aortic atherosclerotic disease and 30% R-ICA stenosis. No measurable on the left. The patient has been monitored on telemetry which has demonstrated sinus rhythm with no arrhythmias.  Inpatient stroke work-up will not require a TEE per Neurology.  ° °Echocardiogram this admission demonstrated EF 60-65%.  Lab work is reviewed. ° °Prior to admission, the patient denies chest pain, shortness of breath, dizziness, palpitations, or syncope. She doesn't remember much of what happened just prior to her admission. They are recovering from their stroke with plans to rehab at SNF at discharge. ° °Past Medical History:  °Diagnosis Date  °• COPD (chronic obstructive pulmonary disease) (HCC)   °• Depression with anxiety   °• Hypertension   °• Hypothyroid   °• Stroke (HCC) 2015  °  ° °Surgical History:  °Past Surgical History:  °Procedure Laterality Date  °• ABDOMINAL HYSTERECTOMY    °• ANTERIOR CERVICAL DECOMP/DISCECTOMY FUSION N/A 03/18/2019  ° Procedure: ANTERIOR CERVICAL  DECOMPRESSION/DISCECTOMY FUSION, CERVICAL FOUR- CERVICAL FIVE, CERVICAL FIVE- CERVICAL SIX, CERVICAL SIX- CERVICAL SEVEN;  Surgeon: Nundkumar, Neelesh, MD;  Location: [facility] OR;  Service: Neurosurgery;  Laterality: N/A;  °• CHOLECYSTECTOMY    °• EYE SURGERY Right   °• IR ANGIO INTRA EXTRACRAN SEL COM CAROTID INNOMINATE BILAT MOD SED  09/19/2016  °• IR ANGIO VERTEBRAL SEL VERTEBRAL UNI L MOD SED  09/19/2016  °• IR US GUIDE VASC ACCESS RIGHT  09/19/2016  °• TUMOR REMOVAL    °  ° °Medications Prior to Admission  °Medication Sig Dispense Refill Last Dose  °• aspirin 325 MG tablet Take 1 tablet (325 mg total) by mouth daily.   04/09/2019 at Unknown time  °• clobetasol ointment (TEMOVATE) 0.05 % Apply 1 application topically 2 (two) times daily. Up to 3 times daily   04/09/2019 at Unknown time  °• FLUoxetine (PROZAC) 20 MG tablet Take 20 mg by mouth at bedtime.   04/08/2019 at Unknown time  °• levothyroxine (SYNTHROID) 112 MCG tablet Take 112 mcg by mouth daily before breakfast.    04/09/2019 at Unknown time  °• linaclotide (LINZESS) 145 MCG CAPS capsule Take 145 mcg by mouth daily as needed (constipation).   04/09/2019 at Unknown time  °• OLANZapine (ZYPREXA) 5 MG tablet Take 5 mg by mouth at bedtime.   04/08/2019 at Unknown time  °• omeprazole (PRILOSEC) 20 MG capsule Take 20 mg by mouth daily.    04/09/2019 at Unknown time  °• OXYGEN Inhale 2 L into the lungs at bedtime.   04/08/2019 at Unknown time  °• Vitamin D, Ergocalciferol, (DRISDOL) 1.25 MG (50000 UT) CAPS capsule Take 50,000 Units by mouth 2 (two) times a week. Tuesday and Friday     04/08/2019 at Unknown time  °• HYDROcodone-acetaminophen (NORCO/VICODIN) 5-325 MG tablet Take 1 tablet by mouth every 4 (four) hours as needed for moderate pain. (Patient not taking: Reported on 04/09/2019) 60 tablet 0 Not Taking at Unknown time  °• potassium chloride (KLOR-CON) 20 MEQ packet Take 20 mEq by mouth 2 (two) times daily. (Patient not taking: Reported on 04/09/2019) 4 packet 0 Not Taking  at Unknown time  ° ° °Inpatient Medications:  °• aspirin EC  81 mg Oral Daily  °• atorvastatin  20 mg Oral q1800  °• clobetasol ointment  1 application Topical Daily  °• clopidogrel  75 mg Oral Daily  °• enoxaparin (LOVENOX) injection  40 mg Subcutaneous Daily  °• levothyroxine  112 mcg Oral QAC breakfast  °• neomycin-bacitracin-polymyxin   Topical BID  °• pantoprazole  40 mg Oral Daily  °• Vitamin D (Ergocalciferol)  50,000 Units Oral Once per day on Tue Fri  ° ° °Allergies:  °Allergies  °Allergen Reactions  °• Amoxicillin Rash  ° ° °Social History  ° °Socioeconomic History  °• Marital status: Divorced  °  Spouse name: Not on file  °• Number of children: Not on file  °• Years of education: Not on file  °• Highest education level: Not on file  °Occupational History  °• Not on file  °Tobacco Use  °• Smoking status: Current Every Day Smoker  °  Packs/day: 1.00  °  Types: Cigarettes  °• Smokeless tobacco: Never Used  °Substance and Sexual Activity  °• Alcohol use: No  °• Drug use: No  °• Sexual activity: Not on file  °Other Topics Concern  °• Not on file  °Social History Narrative  ° Patient lives at home alone. Is a widow  has 3 children and a high school education.  ° Patient smokes 1 pack of cigarettes and drinks 1-2 cups of coffee.   ° °Social Determinants of Health  ° °Financial Resource Strain:   °• Difficulty of Paying Living Expenses: Not on file  °Food Insecurity:   °• Worried About Running Out of Food in the Last Year: Not on file  °• Ran Out of Food in the Last Year: Not on file  °Transportation Needs:   °• Lack of Transportation (Medical): Not on file  °• Lack of Transportation (Non-Medical): Not on file  °Physical Activity:   °• Days of Exercise per Week: Not on file  °• Minutes of Exercise per Session: Not on file  °Stress:   °• Feeling of Stress : Not on file  °Social Connections:   °• Frequency of Communication with Friends and Family: Not on file  °• Frequency of Social Gatherings with Friends and  Family: Not on file  °• Attends Religious Services: Not on file  °• Active Member of Clubs or Organizations: Not on file  °• Attends Club or Organization Meetings: Not on file  °• Marital Status: Not on file  °Intimate Partner Violence:   °• Fear of Current or Ex-Partner: Not on file  °• Emotionally Abused: Not on file  °• Physically Abused: Not on file  °• Sexually Abused: Not on file  °  ° °Family History  °Problem Relation Age of Onset  °• Cancer Mother   °• Cancer Father   °• Colon cancer Neg Hx   °  ° ° °Review of Systems: °All other systems reviewed and are otherwise negative except as noted above. ° °Physical Exam: °Vitals:  ° 04/10/19 2041 04/10/19 2331 04/11/19 0347 04/11/19 0700  °  BP: (!) 159/97 (!) 143/97 (!) 144/87 (!) 155/87  °Pulse: 91 98 87 94  °Resp: 20 17 19 18  °Temp: 97.7 °F (36.5 °C) 97.8 °F (36.6 °C) 98.1 °F (36.7 °C) 98.1 °F (36.7 °C)  °TempSrc: Oral Oral Oral Axillary  °SpO2: 98% 100% 99% 100%  ° ° °GEN- The patient is elderly appearing, alert and oriented x 3 today.   °Head- normocephalic, atraumatic °Eyes-  Sclera clear, conjunctiva pink °Ears- hearing intact °Oropharynx- clear °Neck- C-Collar in place °Lungs- Clear to ausculation bilaterally, normal work of breathing °Heart- Regular rate and rhythm, no murmurs, rubs or gallops  °GI- soft, NT, ND, + BS °Extremities- no clubbing, cyanosis, or edema °MS- no significant deformity or atrophy °Skin- no rash or lesion °Psych- euthymic mood, full affect ° ° °Labs: °  °Lab Results  °Component Value Date  ° WBC 8.8 04/11/2019  ° HGB 11.8 (L) 04/11/2019  ° HCT 36.2 04/11/2019  ° MCV 95.5 04/11/2019  ° PLT 253 04/11/2019  °  °Recent Labs  °Lab 04/11/19 °0213  °NA 140  °K 3.9  °CL 105  °CO2 28  °BUN 21  °CREATININE 0.88  °CALCIUM 8.6*  °PROT 5.2*  °BILITOT 0.8  °ALKPHOS 72  °ALT 9  °AST 20  °GLUCOSE 100*  ° °  °Radiology/Studies: CT Angio Head W or Wo Contrast ° °Result Date: 04/09/2019 °CLINICAL DATA:  Speech disturbance. Dysarthria. History of brain  aneurysm. EXAM: CT ANGIOGRAPHY HEAD AND NECK TECHNIQUE: Multidetector CT imaging of the head and neck was performed using the standard protocol during bolus administration of intravenous contrast. Multiplanar CT image reconstructions and MIPs were obtained to evaluate the vascular anatomy. Carotid stenosis measurements (when applicable) are obtained utilizing NASCET criteria, using the distal internal carotid diameter as the denominator. CONTRAST:  100mL OMNIPAQUE IOHEXOL 350 MG/ML SOLN COMPARISON:  MRI 11/07/2018 FINDINGS: CT HEAD FINDINGS Brain: Age related volume loss. Chronic small-vessel ischemic changes of the cerebral hemispheric white matter. No sign of acute infarction, mass lesion, hemorrhage, hydrocephalus or extra-axial collection. Vascular: There is atherosclerotic calcification of the major vessels at the base of the brain. Skull: Negative Sinuses: Clear Orbits: Normal Review of the MIP images confirms the above findings CTA NECK FINDINGS Aortic arch: Aortic atherosclerosis. Branching pattern is normal without origin stenosis. Right carotid system: Common carotid artery widely patent to the bifurcation. Calcified plaque at the carotid bifurcation and ICA bulb. Minimal diameter in the ICA bulb is 3.5 mm. Compared to a more distal cervical ICA diameter of 5 mm, this indicates a 30% stenosis. Cervical ICA widely patent beyond that. Left carotid system: Common carotid artery widely patent to the bifurcation. Calcified plaque at carotid bifurcation and ICA bulb. No stenosis. Cervical ICA widely patent beyond that. Vertebral arteries: Both vertebral artery origins are widely patent. Both vertebral arteries are widely patent through the cervical region to the foramen magnum. Skeleton: Previous ACDF C4 through C7. Other neck: No mass or lymphadenopathy. Upper chest: Emphysema.  No focal or active process otherwise. Review of the MIP images confirms the above findings CTA HEAD FINDINGS Anterior circulation:  Both internal carotid arteries are widely patent through the skull base and siphon regions. There is atherosclerotic calcification in both carotid siphon regions but without stenosis. There are small atherosclerotic aneurysms in the siphon regions bilaterally, none larger than 2 mm. The anterior and middle cerebral vessels are patent. No large or medium vessel occlusion. Posterior circulation: Both vertebral arteries are patent through the foramen magnum to the basilar. No   basilar stenosis. Posterior circulation branch vessels are patent. Venous sinuses: Patent and normal. Anatomic variants: None significant. Review of the MIP images confirms the above findings IMPRESSION: No intracranial large or medium vessel occlusion. Advanced aortic atherosclerotic disease. Atherosclerotic disease of the carotid bifurcation regions. 30% ICA stenosis on the right. No measurable stenosis on the left. 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 Head CT does not show any acute finding. Chronic small-vessel ischemic changes of the white matter. Electronically Signed   By: Mark  Shogry M.D.   On: 04/09/2019 18:55  ° °DG Cervical Spine 2-3 Views ° °Result Date: 03/18/2019 °CLINICAL DATA:  C4-7 ACDF. EXAM: DG C-ARM 1-60 MIN; CERVICAL SPINE - 2-3 VIEW COMPARISON:  Cervical MRI 11/07/2018. FINDINGS: C-arm fluoroscopy was provided in the operating room. 4 seconds of fluoroscopy time. 3 spot lateral fluoroscopic images are submitted. The initial image demonstrates anterior localization of the C5-6 disc space. Subsequent views demonstrate anterior discectomy and fusion from C4 through C7 with an anterior plate, screws and interbody spacers. The hardware appears well positioned. No complications are identified. Surgical sponges remain anteriorly in the surgical bed. IMPRESSION: Intraoperative views during C4-7 ACDF. No demonstrated complication. Electronically  Signed   By: William  Veazey M.D.   On: 03/18/2019 13:42  ° °CT Angio Neck W and/or Wo Contrast ° °Result Date: 04/09/2019 °CLINICAL DATA:  Speech disturbance. Dysarthria. History of brain aneurysm. EXAM: CT ANGIOGRAPHY HEAD AND NECK TECHNIQUE: Multidetector CT imaging of the head and neck was performed using the standard protocol during bolus administration of intravenous contrast. Multiplanar CT image reconstructions and MIPs were obtained to evaluate the vascular anatomy. Carotid stenosis measurements (when applicable) are obtained utilizing NASCET criteria, using the distal internal carotid diameter as the denominator. CONTRAST:  100mL OMNIPAQUE IOHEXOL 350 MG/ML SOLN COMPARISON:  MRI 11/07/2018 FINDINGS: CT HEAD FINDINGS Brain: Age related volume loss. Chronic small-vessel ischemic changes of the cerebral hemispheric white matter. No sign of acute infarction, mass lesion, hemorrhage, hydrocephalus or extra-axial collection. Vascular: There is atherosclerotic calcification of the major vessels at the base of the brain. Skull: Negative Sinuses: Clear Orbits: Normal Review of the MIP images confirms the above findings CTA NECK FINDINGS Aortic arch: Aortic atherosclerosis. Branching pattern is normal without origin stenosis. Right carotid system: Common carotid artery widely patent to the bifurcation. Calcified plaque at the carotid bifurcation and ICA bulb. Minimal diameter in the ICA bulb is 3.5 mm. Compared to a more distal cervical ICA diameter of 5 mm, this indicates a 30% stenosis. Cervical ICA widely patent beyond that. Left carotid system: Common carotid artery widely patent to the bifurcation. Calcified plaque at carotid bifurcation and ICA bulb. No stenosis. Cervical ICA widely patent beyond that. Vertebral arteries: Both vertebral artery origins are widely patent. Both vertebral arteries are widely patent through the cervical region to the foramen magnum. Skeleton: Previous ACDF C4 through C7. Other neck:  No mass or lymphadenopathy. Upper chest: Emphysema.  No focal or active process otherwise. Review of the MIP images confirms the above findings CTA HEAD FINDINGS Anterior circulation: Both internal carotid arteries are widely patent through the skull base and siphon regions. There is atherosclerotic calcification in both carotid siphon regions but without stenosis. There are small atherosclerotic aneurysms in the siphon regions bilaterally, none larger than 2 mm. The anterior and middle cerebral vessels are patent. No large or medium vessel occlusion. Posterior circulation: Both vertebral arteries are patent through the   foramen magnum to the basilar. No basilar stenosis. Posterior circulation branch vessels are patent. Venous sinuses: Patent and normal. Anatomic variants: None significant. Review of the MIP images confirms the above findings IMPRESSION: No intracranial large or medium vessel occlusion. Advanced aortic atherosclerotic disease. Atherosclerotic disease of the carotid bifurcation regions. 30% ICA stenosis on the right. No measurable stenosis on the left. 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 Head CT does not show any acute finding. Chronic small-vessel ischemic changes of the white matter. Electronically Signed   By: Mark  Shogry M.D.   On: 04/09/2019 18:55  ° °MR Brain Wo Contrast (neuro protocol) ° °Result Date: 04/09/2019 °CLINICAL DATA:  History of stroke and brain aneurysm. Speech disturbance. EXAM: MRI HEAD WITHOUT CONTRAST TECHNIQUE: Multiplanar, multiecho pulse sequences of the brain and surrounding structures were obtained without intravenous contrast. COMPARISON:  CT angiography same day FINDINGS: Brain: Diffusion imaging shows a punctate acute white matter infarction in the right frontal subcortical white matter. Few small foci of acute infarction affecting right parietal gyral surfaces. Punctate acute  infarction of the right parietal deep white matter. This is consistent with micro embolic disease in the right MCA territory. No large or confluent acute infarction. There are chronic small-vessel ischemic changes of the pons and cerebellum. Old small vessel infarctions of the thalami and throughout the cerebral hemispheric white matter. There is an old left occipital cortical infarction. No mass, acute hemorrhage, hydrocephalus or extra-axial collection. Vascular: Major vessels at the base of the brain show flow. Skull and upper cervical spine: Negative Sinuses/Orbits: Clear/normal Other: None IMPRESSION: Several punctate acute infarctions scattered in the right MCA territory affecting the right frontal and parietal deep white matter and right parietal gyral surfaces. No swelling or hemorrhage. No large confluent acute infarction. Extensive chronic small-vessel ischemic changes elsewhere throughout the brain. Old left occipital infarction. Electronically Signed   By: Mark  Shogry M.D.   On: 04/09/2019 21:45  ° °DG Chest Portable 1 View ° °Result Date: 04/09/2019 °CLINICAL DATA:  Altered mental status elevated white count EXAM: PORTABLE CHEST 1 VIEW COMPARISON:  07/05/2018 FINDINGS: No focal airspace disease or pleural effusion. Mild cardiomegaly with tortuous aorta. Aortic atherosclerosis. No pneumothorax. IMPRESSION: No active disease. Mild cardiomegaly. Electronically Signed   By: Kim  Fujinaga M.D.   On: 04/09/2019 15:19  ° °DG C-Arm 1-60 Min ° °Result Date: 03/18/2019 °CLINICAL DATA:  C4-7 ACDF. EXAM: DG C-ARM 1-60 MIN; CERVICAL SPINE - 2-3 VIEW COMPARISON:  Cervical MRI 11/07/2018. FINDINGS: C-arm fluoroscopy was provided in the operating room. 4 seconds of fluoroscopy time. 3 spot lateral fluoroscopic images are submitted. The initial image demonstrates anterior localization of the C5-6 disc space. Subsequent views demonstrate anterior discectomy and fusion from C4 through C7 with an anterior plate, screws and  interbody spacers. The hardware appears well positioned. No complications are identified. Surgical sponges remain anteriorly in the surgical bed. IMPRESSION: Intraoperative views during C4-7 ACDF. No demonstrated complication. Electronically Signed   By: William  Veazey M.D.   On: 03/18/2019 13:42  ° °ECHOCARDIOGRAM COMPLETE ° °Result Date: 04/10/2019 °  ECHOCARDIOGRAM REPORT   Patient Name:   Aleigha J Christiana Date of Exam: 04/10/2019 Medical Rec #:  [id number]        Height:       62.0 in Accession #:    [id number]       Weight:       138.0 lb Date of Birth:    02/12/1941        BSA:          1.63 m² Patient Age:    78 years         BP:           127/93 mmHg Patient Gender: F                HR:           90 bpm. Exam Location:  Inpatient Procedure: 2D Echo Indications:    Stroke  History:        Patient has prior history of Echocardiogram examinations, most                 recent 12/04/2018.  Sonographer:    Arthur Guy RDCS (AE) Referring Phys: [id number] MIRCEA G CRISTESCU  Sonographer Comments: Suboptimal parasternal window. Image acquisition challenging due to patient behavioral factors. and Image acquisition challenging due to uncooperative patient. Unable to complete echo due to patient pushing and hitting my left arm during echo. IMPRESSIONS  1. Left ventricular ejection fraction, by visual estimation, is 60 to 65%. The left ventricle has normal function. There is no left ventricular hypertrophy.  2. The left ventricle has no regional wall motion abnormalities.  3. Incomplete study due to lack of patient cooperation. Visualized segments (anteroseptal and inferolateral myocardium) demonstrated normal systolic function.  4. Global right ventricle was not assessed.The right ventricular size is not assessed. Right vetricular wall thickness was not assessed.  5. Left atrial size was normal.  6. Right atrial size was not well visualized.  7. The mitral valve is normal in structure. No evidence of mitral valve regurgitation.  No evidence of mitral stenosis.  8. The tricuspid valve is not assessed.  9. The tricuspid valve is not assessed. Tricuspid valve regurgitation is not demonstrated. 10. The aortic valve was not assessed. Aortic valve regurgitation is not visualized. No evidence of aortic valve sclerosis or stenosis. 11. The pulmonic valve was not assessed. Pulmonic valve regurgitation is not visualized. 12. The aortic root was not well visualized, the ascending aorta was not well visualized and the aortic arch was not well visualized. 13. TR signal is inadequate for assessing pulmonary artery systolic pressure. 14. The interatrial septum was not assessed. FINDINGS  Left Ventricle: Left ventricular ejection fraction, by visual estimation, is 60 to 65%. The left ventricle has normal function. The left ventricle has no regional wall motion abnormalities. There is no left ventricular hypertrophy. Normal left atrial pressure. Incomplete study due to lack of patient cooperation. Visualized segments (anteroseptal and inferolateral myocardium) demonstrated normal systolic function. Right Ventricle: The right ventricular size is not assessed. Right vetricular wall thickness was not assessed. Global RV systolic function is was not assessed. Left Atrium: Left atrial size was normal in size. Right Atrium: Right atrial size was not well visualized Pericardium: There is no evidence of pericardial effusion. Mitral Valve: The mitral valve is normal in structure. No evidence of mitral valve regurgitation. No evidence of mitral valve stenosis by observation. Tricuspid Valve: The tricuspid valve is not assessed. Tricuspid valve regurgitation is not demonstrated. Aortic Valve: The aortic valve was not assessed. Aortic valve regurgitation is not visualized. The aortic valve is structurally normal, with no evidence of sclerosis or stenosis. Pulmonic Valve: The pulmonic valve was not assessed. Pulmonic valve regurgitation is not visualized. Pulmonic  regurgitation is not visualized. Aorta: The aortic root was not well visualized, the ascending aorta was not well visualized and the aortic   arch was not well visualized. Venous: The inferior vena cava was not well visualized. IAS/Shunts: The interatrial septum was not assessed. There is no evidence of a patent foramen ovale.  Tiffany [place] MD Electronically signed by Tiffany [place] MD Signature Date/Time: 04/10/2019/11:49:24 AM    Final   ° °VAS US LOWER EXTREMITY VENOUS (DVT) ° °Result Date: 04/10/2019 ° Lower Venous Study Indications: Stroke.  Comparison Study: no prior Performing Technologist: Megan Riddle RVS  Examination Guidelines: A complete evaluation includes B-mode imaging, spectral Doppler, color Doppler, and power Doppler as needed of all accessible portions of each vessel. Bilateral testing is considered an integral part of a complete examination. Limited examinations for reoccurring indications may be performed as noted.  +---------+---------------+---------+-----------+----------+--------------+  RIGHT     Compressibility Phasicity Spontaneity Properties Thrombus Aging  +---------+---------------+---------+-----------+----------+--------------+  CFV       Full            Yes       Yes                                    +---------+---------------+---------+-----------+----------+--------------+  SFJ       Full                                                             +---------+---------------+---------+-----------+----------+--------------+  FV Prox   Full                                                             +---------+---------------+---------+-----------+----------+--------------+  FV Mid    Full                                                             +---------+---------------+---------+-----------+----------+--------------+  FV Distal Full                                                             +---------+---------------+---------+-----------+----------+--------------+  PFV        Full                                                             +---------+---------------+---------+-----------+----------+--------------+  POP       Full            Yes       Yes                                    +---------+---------------+---------+-----------+----------+--------------+    PTV       Full                                                             +---------+---------------+---------+-----------+----------+--------------+  PERO      Full                                                             +---------+---------------+---------+-----------+----------+--------------+   +---------+---------------+---------+-----------+----------+--------------+  LEFT      Compressibility Phasicity Spontaneity Properties Thrombus Aging  +---------+---------------+---------+-----------+----------+--------------+  CFV       Full            Yes       Yes                                    +---------+---------------+---------+-----------+----------+--------------+  SFJ       Full                                                             +---------+---------------+---------+-----------+----------+--------------+  FV Prox   Full                                                             +---------+---------------+---------+-----------+----------+--------------+  FV Mid    Full                                                             +---------+---------------+---------+-----------+----------+--------------+  FV Distal Full                                                             +---------+---------------+---------+-----------+----------+--------------+  PFV       Full                                                             +---------+---------------+---------+-----------+----------+--------------+  POP       Full            Yes       Yes                                    +---------+---------------+---------+-----------+----------+--------------+    PTV       Full                                                              +---------+---------------+---------+-----------+----------+--------------+  PERO      Full                                                             +---------+---------------+---------+-----------+----------+--------------+     Summary: Right: There is no evidence of deep vein thrombosis in the lower extremity. No cystic structure found in the popliteal fossa. Left: There is no evidence of deep vein thrombosis in the lower extremity. No cystic structure found in the popliteal fossa.  *See table(s) above for measurements and observations. Electronically signed by Christopher Clark MD on 04/10/2019 at 4:29:03 PM.    Final   ° ° °12-lead ECG 1/27 showed sinus tach at 108 bpm with PR 128 ms (personally reviewed) °All prior EKG's in EPIC reviewed with no documented atrial fibrillation ° °Telemetry NSR 90s (personally reviewed) ° °Assessment and Plan: ° °1. Cryptogenic stroke °The patient presents with cryptogenic stroke.  The patient does not have a TEE planned for this AM.  I spoke at length with the patient about monitoring for afib with an implantable loop recorder.  Risks, benefits, and alteratives to implantable loop recorder were discussed with the patient today.   At this time, the patient is very clear in their decision to proceed with implantable loop recorder.  ° °2. Recent C-spine surgery °Will discuss with MD to make sure this will not interfere with placement re: C-collar in place ° °Wound care was reviewed with the patient (keep incision clean and dry for 3 days).  Wound check scheduled and entered in AVS. Please call with questions.  ° °Michael Andrew Tillery, PA-C °04/11/2019 °10:12 AM ° °I have seen, examined the patient, and reviewed the above assessment and plan.  Changes to above are made where necessary.  On exam, RRR.  °I agree with Dr Xu that ILR is indicated for further evaluation of atrial fibrillation as a possible cause of her stroke. °I spoke at length with the patient  about monitoring for afib an implantable loop recorder.  Risks, benefits, and alteratives to implantable loop recorder were discussed with the patient today.   At this time, the patient is very clear in their decision to proceed with implantable loop recorder.  ° °Please call with questions. ° ° °Co Sign: Zayden Hahne, MD °04/11/2019 °12:57 PM ° ° °

## 2019-04-10 NOTE — Progress Notes (Signed)
Patient placed in observation after midnight but care began prior to midnight in the ER.   Reviewed paperwork from [facility]-- she has a DNR order in place-- will change CODE status.   Was on pureed diet with nectar thick liquids at SNF.  Per chart passed [facility].  Will add diet but got ativan last PM for unknown reasons so may  Not be awake enough to eat for now.   MRI + for CVA so work up in progress Will notify NS that patient is in hospital as she had recent surgery.  Marlin Canary DO

## 2019-04-10 NOTE — Evaluation (Signed)
Physical Therapy Evaluation Patient Details Name: Maria Blackwell MRN: [id number] DOB: [birth year] Today's Date: 04/10/2019   History of Present Illness  Maria Blackwell is a 79 y.o. female with past medical history significant for hypertension, COPD prior stroke with recent C-spine surgery presents to the emergency department from [facility] with slurred speech and generalized weakness associated with multiple falls. MRI brain was performed which showed punctate acute infarctions across the right MCA territory.  Clinical Impression  Pt admitted with above. Limited evaluation due to pt lethargy; likely impacted by recent Ativan use. Prior to admission, pt from [facility]; known to be ambulatory with a walker s/p cervical surgery on 03/18/2019. Currently, pt requiring up to two person maximal assist for bed mobility. Further transfer deferred as pt resistant and wanting to lie back down. Will continue to follow acutely and progress mobility as tolerated. Recommend return to SNF upon discharge.     Follow Up Recommendations SNF;Supervision/Assistance - 24 hour    Equipment Recommendations  None recommended by PT    Recommendations for Other Services       Precautions / Restrictions Precautions Precautions: Fall;Cervical Precaution Comments: no recall of precautions Required Braces or Orthoses: Cervical Brace Cervical Brace: Hard collar;At all times Restrictions Weight Bearing Restrictions: No      Mobility  Bed Mobility Overal bed mobility: Needs Assistance Bed Mobility: Rolling;Sidelying to Sit;Sit to Sidelying Rolling: Max assist Sidelying to sit: Max assist;+2 for physical assistance     Sit to sidelying: Min assist General bed mobility comments: Pt requiring maxA for initiation to roll to right/left; maxA + 2 to sit edge of bed, pt very resistant; pt able to initiate progression back into sidelying, assist provided for repositioning  Transfers                 General  transfer comment: deferred due to lethargy  Ambulation/Gait                Stairs            Wheelchair Mobility    Modified Rankin (Stroke Patients Only) Modified Rankin (Stroke Patients Only) Pre-Morbid Rankin Score: Moderately severe disability Modified Rankin: Severe disability     Balance Overall balance assessment: Needs assistance Sitting-balance support: No upper extremity supported;Feet supported Sitting balance-Leahy Scale: Poor Sitting balance - Comments: Requiring modA to sit upright, right lateral lean; difficult to determine if pt was just trying to lie back down Postural control: Right lateral lean                                   Pertinent Vitals/Pain Pain Assessment: Faces Faces Pain Scale: Hurts a little bit Pain Location: grimacing with repositioning Pain Descriptors / Indicators: Grimacing Pain Intervention(s): Limited activity within patient's tolerance;Monitored during session;Repositioned    Home Living Family/patient expects to be discharged to:: Skilled nursing facility                      Prior Function Level of Independence: Needs assistance   Gait / Transfers Assistance Needed: As of 03/19/2018, pt ambulating 100 ft with RW at a min guard assist level. However, pt arrived [facility] with wheelchair and hoyer lift from SNF  ADL's / Homemaking Assistance Needed: Likely requiring assist for ADL's at SNF        Hand Dominance   Dominant Hand: Left    Extremity/Trunk Assessment   Upper  Extremity Assessment Upper Extremity Assessment: Defer to OT evaluation    Lower Extremity Assessment Lower Extremity Assessment: RLE deficits/detail;LLE deficits/detail;Difficult to assess due to impaired cognition RLE Deficits / Details: At least 3/5 LLE Deficits / Details: At least 3/5    Cervical / Trunk Assessment Cervical / Trunk Assessment: Other exceptions Cervical / Trunk Exceptions: s/p ACDF  Communication    Communication: HOH  Cognition Arousal/Alertness: Lethargic Behavior During Therapy: Restless Overall Cognitive Status: History of cognitive impairments - at baseline Area of Impairment: Orientation;Following commands;Awareness;Attention                 Orientation Level: Disoriented to;Place;Time;Situation Current Attention Level: Focused       Awareness: Intellectual   General Comments: Pt with decreased arousal; cognition likely impacted by Ativan. Following one step commands ~25-50% of the time, stating she was "in her bed," when asked where she was.       General Comments      Exercises     Assessment/Plan    PT Assessment Patient needs continued PT services  PT Problem List Decreased strength;Decreased activity tolerance;Decreased mobility;Decreased balance;Decreased cognition;Decreased safety awareness       PT Treatment Interventions DME instruction;Gait training;Functional mobility training;Therapeutic activities;Therapeutic exercise;Balance training;Patient/family education    PT Goals (Current goals can be found in the Care Plan section)  Acute Rehab PT Goals Patient Stated Goal: to lie back down PT Goal Formulation: With patient Time For Goal Achievement: 04/24/19 Potential to Achieve Goals: Fair    Frequency Min 3X/week   Barriers to discharge        Co-evaluation PT/OT/SLP Co-Evaluation/Treatment: Yes Reason for Co-Treatment: Complexity of the patient's impairments (multi-system involvement);Necessary to address cognition/behavior during functional activity;For patient/therapist safety;To address functional/ADL transfers PT goals addressed during session: Mobility/safety with mobility         AM-PAC PT "6 Clicks" Mobility  Outcome Measure Help needed turning from your back to your side while in a flat bed without using bedrails?: Total Help needed moving from lying on your back to sitting on the side of a flat bed without using bedrails?:  Total Help needed moving to and from a bed to a chair (including a wheelchair)?: Total Help needed standing up from a chair using your arms (e.g., wheelchair or bedside chair)?: Total Help needed to walk in hospital room?: Total Help needed climbing 3-5 steps with a railing? : Total 6 Click Score: 6    End of Session Equipment Utilized During Treatment: Cervical collar Activity Tolerance: Patient limited by lethargy Patient left: in bed;with call bell/phone within reach;with bed alarm set Nurse Communication: Mobility status PT Visit Diagnosis: Muscle weakness (generalized) (M62.81);Other abnormalities of gait and mobility (R26.89);Other symptoms and signs involving the nervous system (R29.898)    Time: 0630-1601 PT Time Calculation (min) (ACUTE ONLY): 24 min   Charges:   PT Evaluation $PT Eval Moderate Complexity: 1 Mod          Ellamae Sia, [place], DPT Acute Rehabilitation Services Pager [phone] Office [phone]   Deno Etienne 04/10/2019, 9:22 AM

## 2019-04-10 NOTE — Consult Note (Signed)
Requesting Physician: Michela Pitcher PA-C    Chief Complaint: slurred speech  History obtained from: Patient and Chart     HPI:                                                                                                                                       Maria Blackwell is a 79 y.o. female with past medical history significant for hypertension, COPD prior stroke with recent C-spine surgery presents to the emergency department with slurred speech and generalized weakness associated with multiple falls.  History obtained was limited as patient significantly drowsy after receiving Ativan for agitation Last known normal was approximately 5 days ago, when she noticed she was having some slurred speech and trouble swallowing.  Patient also having multiple falls and decided to present to the ER.  Surgery was performed on Jan 5 and was doing well postoperatively and ambulating without difficulty.    Work-up in the ED included CT angiogram which showed no significant flow-limiting stenosis however patient does have  atherosclerotic disease, most notably in the aorta.  MRI brain was performed which showed punctate acute infarctions across the right MCA territory. Neurology was consulted for further recommendations.   She has a history of prior stroke in the pons in 2013, felt to be secondary to small vessel disease from hypertension.  She has no residual deficits from the stroke.    Past Medical History:  Diagnosis Date  . COPD (chronic obstructive pulmonary disease) (HCC)   . Depression with anxiety   . Hypertension   . Hypothyroid   . Stroke [facility]) 2015    Past Surgical History:  Procedure Laterality Date  . ABDOMINAL HYSTERECTOMY    . ANTERIOR CERVICAL DECOMP/DISCECTOMY FUSION N/A 03/18/2019   Procedure: ANTERIOR CERVICAL DECOMPRESSION/DISCECTOMY FUSION, CERVICAL FOUR- CERVICAL FIVE, CERVICAL FIVE- CERVICAL SIX, CERVICAL SIX- CERVICAL SEVEN;  Surgeon: Lisbeth Renshaw, MD;  Location: [facility]  OR;  Service: Neurosurgery;  Laterality: N/A;  . CHOLECYSTECTOMY    . EYE SURGERY Right   . IR ANGIO INTRA EXTRACRAN SEL COM CAROTID INNOMINATE BILAT MOD SED  09/19/2016  . IR ANGIO VERTEBRAL SEL VERTEBRAL UNI L MOD SED  09/19/2016  . IR US GUIDE VASC ACCESS RIGHT  09/19/2016  . TUMOR REMOVAL      Family History  Problem Relation Age of Onset  . Cancer Mother   . Cancer Father   . Colon cancer Neg Hx    Social History:  reports that she has been smoking cigarettes. She has been smoking about 1.00 pack per day. She has never used smokeless tobacco. She reports that she does not drink alcohol or use drugs.  Allergies:  Allergies  Allergen Reactions  . Amoxicillin Rash    Medications:  I reviewed home medications.  Patient on aspirin 325 mg   ROS:                                                                                                                                     14 systems reviewed and negative except above    Examination:                                                                                                      General: Appears well-developed and well-nourished.  Psych: Unable to assess due to lethargic Eyes: No scleral injection HENT: No OP obstrucion, c-collar in place Head: Normocephalic.  Cardiovascular: Normal rate and regular rhythm.  Respiratory: Effort normal and breath sounds normal to anterior ascultation GI: Soft.  No distension. There is no tenderness.  Skin: WDI    Neurological Examination Mental Status: Patient somnolent but arousable, answers simple questions such as her name, age, date of birth correctly , confused to month.  Will follow simple commands intermittently and then drift back to sleep Cranial Nerves: II: Visual fields grossly normal, blinks to threat bilaterally III,IV, VI: ptosis not present,  extra-ocular motions intact bilaterally, pupils equal, round, reactive to light and accommodation V,VII: smile symmetric, facial light touch sensation normal bilaterally VIII: hearing normal bilaterally IX,X: uvula rises symmetrically XI: bilateral shoulder shrug XII: midline tongue extension Motor: Right : Upper extremity   4/5    Left:     Upper extremity   4/5  Lower extremity   3/5     Lower extremity   3/5 Tone and bulk:normal tone throughout; no atrophy noted Sensory: Pinprick and light touch symmetric on both sides Deep Tendon Reflexes: 3+ patellar reflexes with crossed adductor, brisk bilateral biceps, no Hoffman. Plantars: Not assessed Cerebellar: No gross ataxia noted      Lab Results: Basic Metabolic Panel: Recent Labs  Lab 04/09/19 1256 04/09/19 1305  NA 143 143  K 3.6 3.6  CL 106 108  CO2 25  --   GLUCOSE 114* 109*  BUN 41* 41*  CREATININE 1.13* 1.10*  CALCIUM 9.8  --     CBC: Recent Labs  Lab 04/09/19 1256 04/09/19 1305  WBC 13.8*  --   NEUTROABS 9.8*  --   HGB 13.8 13.9  HCT 43.9 41.0  MCV 97.8  --   PLT 328  --     Coagulation Studies: Recent Labs    04/09/19 1256  LABPROT 12.7  INR 1.0  Imaging: CT Angio Head W or Wo Contrast  Result Date: 04/09/2019 CLINICAL DATA:  Speech disturbance. Dysarthria. History of brain aneurysm. EXAM: CT ANGIOGRAPHY HEAD AND NECK TECHNIQUE: Multidetector CT imaging of the head and neck was performed using the standard protocol during bolus administration of intravenous contrast. Multiplanar CT image reconstructions and MIPs were obtained to evaluate the vascular anatomy. Carotid stenosis measurements (when applicable) are obtained utilizing NASCET criteria, using the distal internal carotid diameter as the denominator. CONTRAST:  100mL OMNIPAQUE IOHEXOL 350 MG/ML SOLN COMPARISON:  MRI 11/07/2018 FINDINGS: CT HEAD FINDINGS Brain: Age related volume loss. Chronic small-vessel ischemic changes of the cerebral  hemispheric white matter. No sign of acute infarction, mass lesion, hemorrhage, hydrocephalus or extra-axial collection. Vascular: There is atherosclerotic calcification of the major vessels at the base of the brain. Skull: Negative Sinuses: Clear Orbits: Normal Review of the MIP images confirms the above findings CTA NECK FINDINGS Aortic arch: Aortic atherosclerosis. Branching pattern is normal without origin stenosis. Right carotid system: Common carotid artery widely patent to the bifurcation. Calcified plaque at the carotid bifurcation and ICA bulb. Minimal diameter in the ICA bulb is 3.5 mm. Compared to a more distal cervical ICA diameter of 5 mm, this indicates a 30% stenosis. Cervical ICA widely patent beyond that. Left carotid system: Common carotid artery widely patent to the bifurcation. Calcified plaque at carotid bifurcation and ICA bulb. No stenosis. Cervical ICA widely patent beyond that. Vertebral arteries: Both vertebral artery origins are widely patent. Both vertebral arteries are widely patent through the cervical region to the foramen magnum. Skeleton: Previous ACDF C4 through C7. Other neck: No mass or lymphadenopathy. Upper chest: Emphysema.  No focal or active process otherwise. Review of the MIP images confirms the above findings CTA HEAD FINDINGS Anterior circulation: Both internal carotid arteries are widely patent through the skull base and siphon regions. There is atherosclerotic calcification in both carotid siphon regions but without stenosis. There are small atherosclerotic aneurysms in the siphon regions bilaterally, none larger than 2 mm. The anterior and middle cerebral vessels are patent. No large or medium vessel occlusion. Posterior circulation: Both vertebral arteries are patent through the foramen magnum to the basilar. No basilar stenosis. Posterior circulation branch vessels are patent. Venous sinuses: Patent and normal. Anatomic variants: None significant. Review of the MIP  images confirms the above findings IMPRESSION: No intracranial large or medium vessel occlusion. Advanced aortic atherosclerotic disease. Atherosclerotic disease of the carotid bifurcation regions. 30% ICA stenosis on the right. No measurable stenosis on the left. 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 Head CT does not show any acute finding. Chronic small-vessel ischemic changes of the white matter. Electronically Signed   By: Paulina FusiMark  Shogry M.D.   On: 04/09/2019 18:55   CT Angio Neck W and/or Wo Contrast  Result Date: 04/09/2019 CLINICAL DATA:  Speech disturbance. Dysarthria. History of brain aneurysm. EXAM: CT ANGIOGRAPHY HEAD AND NECK TECHNIQUE: Multidetector CT imaging of the head and neck was performed using the standard protocol during bolus administration of intravenous contrast. Multiplanar CT image reconstructions and MIPs were obtained to evaluate the vascular anatomy. Carotid stenosis measurements (when applicable) are obtained utilizing NASCET criteria, using the distal internal carotid diameter as the denominator. CONTRAST:  100mL OMNIPAQUE IOHEXOL 350 MG/ML SOLN COMPARISON:  MRI 11/07/2018 FINDINGS: CT HEAD FINDINGS Brain: Age related volume loss. Chronic small-vessel ischemic changes of the cerebral hemispheric white matter. No sign of acute  infarction, mass lesion, hemorrhage, hydrocephalus or extra-axial collection. Vascular: There is atherosclerotic calcification of the major vessels at the base of the brain. Skull: Negative Sinuses: Clear Orbits: Normal Review of the MIP images confirms the above findings CTA NECK FINDINGS Aortic arch: Aortic atherosclerosis. Branching pattern is normal without origin stenosis. Right carotid system: Common carotid artery widely patent to the bifurcation. Calcified plaque at the carotid bifurcation and ICA bulb. Minimal diameter in the ICA bulb is 3.5 mm. Compared to a more  distal cervical ICA diameter of 5 mm, this indicates a 30% stenosis. Cervical ICA widely patent beyond that. Left carotid system: Common carotid artery widely patent to the bifurcation. Calcified plaque at carotid bifurcation and ICA bulb. No stenosis. Cervical ICA widely patent beyond that. Vertebral arteries: Both vertebral artery origins are widely patent. Both vertebral arteries are widely patent through the cervical region to the foramen magnum. Skeleton: Previous ACDF C4 through C7. Other neck: No mass or lymphadenopathy. Upper chest: Emphysema.  No focal or active process otherwise. Review of the MIP images confirms the above findings CTA HEAD FINDINGS Anterior circulation: Both internal carotid arteries are widely patent through the skull base and siphon regions. There is atherosclerotic calcification in both carotid siphon regions but without stenosis. There are small atherosclerotic aneurysms in the siphon regions bilaterally, none larger than 2 mm. The anterior and middle cerebral vessels are patent. No large or medium vessel occlusion. Posterior circulation: Both vertebral arteries are patent through the foramen magnum to the basilar. No basilar stenosis. Posterior circulation branch vessels are patent. Venous sinuses: Patent and normal. Anatomic variants: None significant. Review of the MIP images confirms the above findings IMPRESSION: No intracranial large or medium vessel occlusion. Advanced aortic atherosclerotic disease. Atherosclerotic disease of the carotid bifurcation regions. 30% ICA stenosis on the right. No measurable stenosis on the left. 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 Head CT does not show any acute finding. Chronic small-vessel ischemic changes of the white matter. Electronically Signed   By: Paulina Fusi M.D.   On: 04/09/2019 18:55   MR Brain Wo Contrast (neuro protocol)  Result Date:  04/09/2019 CLINICAL DATA:  History of stroke and brain aneurysm. Speech disturbance. EXAM: MRI HEAD WITHOUT CONTRAST TECHNIQUE: Multiplanar, multiecho pulse sequences of the brain and surrounding structures were obtained without intravenous contrast. COMPARISON:  CT angiography same day FINDINGS: Brain: Diffusion imaging shows a punctate acute white matter infarction in the right frontal subcortical white matter. Few small foci of acute infarction affecting right parietal gyral surfaces. Punctate acute infarction of the right parietal deep white matter. This is consistent with micro embolic disease in the right MCA territory. No large or confluent acute infarction. There are chronic small-vessel ischemic changes of the pons and cerebellum. Old small vessel infarctions of the thalami and throughout the cerebral hemispheric white matter. There is an old left occipital cortical infarction. No mass, acute hemorrhage, hydrocephalus or extra-axial collection. Vascular: Major vessels at the base of the brain show flow. Skull and upper cervical spine: Negative Sinuses/Orbits: Clear/normal Other: None IMPRESSION: Several punctate acute infarctions scattered in the right MCA territory affecting the right frontal and parietal deep white matter and right parietal gyral surfaces. No swelling or hemorrhage. No large confluent acute infarction. Extensive chronic small-vessel ischemic changes elsewhere throughout the brain. Old left occipital infarction. Electronically Signed   By: Paulina Fusi M.D.   On: 04/09/2019 21:45   DG  Chest Portable 1 View  Result Date: 04/09/2019 CLINICAL DATA:  Altered mental status elevated white count EXAM: PORTABLE CHEST 1 VIEW COMPARISON:  07/05/2018 FINDINGS: No focal airspace disease or pleural effusion. Mild cardiomegaly with tortuous aorta. Aortic atherosclerosis. No pneumothorax. IMPRESSION: No active disease. Mild cardiomegaly. Electronically Signed   By: Jasmine Pang M.D.   On: 04/09/2019  15:19     I have reviewed the above imaging : MRI brain as well as CT angiogram head and neck  ASSESSMENT AND PLAN  79 y.o. female with past medical history significant for hypertension, COPD prior stroke with recent C-spine surgery presents to the emergency department with slurred speech and generalized weakness associated with multiple falls. MRI shows multiple acute infarctions in the right MCA.  CTA does not show any significant carotid stenosis   Acute Ischemic Stroke : Punctate infarctions in the right MCA distribution Acute encephalopathy  Risk factors : HTN, prior stroke Etiology: Atheroembolic versus cardioembolic  Recommend #Hold sedating medications, limit pain meds #Transthoracic Echo #Telemetry to monitor for paroxysmal A. fib # Start patient on ASA 81mg  and Plavix 75 mg daily  #Start or continue Atorvastatin 80 mg/other high intensity statin # BP goal: permissive HTN upto 220/120 mmHg ( 185/110 if patient has CHF, CKD) # HBAIC and Lipid profile # Telemetry monitoring # Frequent neuro checks # Stroke swallow screen  Please page stroke NP  Or  PA  Or MD from 8am -4 pm  as this patient from this time will be  followed by the stroke.   You can look them up on [email or web address]  Password [facility]   Daanish Copes [facility] Pager Number [phone]

## 2019-04-10 NOTE — H&P (Signed)
History and Physical    Maria Blackwell WUJ:[id number] DOB: [birth year] DOA: 04/09/2019  PCP: Veverly FellsSanders, Kirk S, MD    Patient coming from: SNF    Chief Complaint: Slurred speech off-and-on, dysphagia unable to ambulate without any support started last week.  HPI: Maria Blackwell is a 79 y.o. female with medical history significant of hypertension, COPD, hypothyroidism, depression, status post C-spine surgery came with a chief complaint of slurred speech of unknown, unable to ambulate, general weakness multiple falls and dysphagia.  Patient is 22 days status post cervical fusion with decompression spinal cord./  On January the 5th.  She was discharged at SNF 2 days later.  She was doing okay but lately she had multiple falls, and Friday 5 days ago started to have some slurred speech of unknown and trouble swallowing.  History was done by family member. On physical exam she does not have any focal neurologic deficits.  She was able to walk in the emergency room with support.  Per family member without support she cannot get up.Marland Kitchen.  She was seen by neurosurgery who recommended evaluation in the emergency room  .Blood pressure 136/86, pulse 91, temperature 98.3 F (36.8 C), temperature source Oral, resp. rate 17, SpO2 98 %.    ED Course:  In the emergency room She was stable hemodynamically no focal neurologic deficits CTA head and neck no acute pathology no significant stenosis MRI MRI shows several punctate acute infarction scattered in the right MCA territory affecting the right frontal and parietal deep white matter and right parietal gyral surfaces suggesting embolic disease. Patient admitted to observation for stroke work-up.  Review of Systems: As per HPI otherwise 10 point review of systems negative.  With exception of slurred speech, mild dysphagia, unable to ambulate  Past Medical History:  Diagnosis Date  . COPD (chronic obstructive pulmonary disease) (HCC)   . Depression with  anxiety   . Hypertension   . Hypothyroid   . Stroke [facility](HCC) 2015    Past Surgical History:  Procedure Laterality Date  . ABDOMINAL HYSTERECTOMY    . ANTERIOR CERVICAL DECOMP/DISCECTOMY FUSION N/A 03/18/2019   Procedure: ANTERIOR CERVICAL DECOMPRESSION/DISCECTOMY FUSION, CERVICAL FOUR- CERVICAL FIVE, CERVICAL FIVE- CERVICAL SIX, CERVICAL SIX- CERVICAL SEVEN;  Surgeon: Lisbeth RenshawNundkumar, Neelesh, MD;  Location: [facility] OR;  Service: Neurosurgery;  Laterality: N/A;  . CHOLECYSTECTOMY    . EYE SURGERY Right   . IR ANGIO INTRA EXTRACRAN SEL COM CAROTID INNOMINATE BILAT MOD SED  09/19/2016  . IR ANGIO VERTEBRAL SEL VERTEBRAL UNI L MOD SED  09/19/2016  . IR US GUIDE VASC ACCESS RIGHT  09/19/2016  . TUMOR REMOVAL       reports that she has been smoking cigarettes. She has been smoking about 1.00 pack per day. She has never used smokeless tobacco. She reports that she does not drink alcohol or use drugs.  Allergies  Allergen Reactions  . Amoxicillin Rash    Family History  Problem Relation Age of Onset  . Cancer Mother   . Cancer Father   . Colon cancer Neg Hx      Prior to Admission medications   Medication Sig Start Date End Date Taking? Authorizing Provider  aspirin 325 MG tablet Take 1 tablet (325 mg total) by mouth daily. 03/26/19  Yes Costella, Darci CurrentVincent J, PA-C  clobetasol ointment (TEMOVATE) 0.05 % Apply 1 application topically 2 (two) times daily. Up to 3 times daily 02/16/19  Yes [provider]  FLUoxetine (PROZAC) 20 MG tablet Take 20  mg by mouth at bedtime.   Yes [provider]  levothyroxine (SYNTHROID) 112 MCG tablet Take 112 mcg by mouth daily before breakfast.  09/18/18  Yes [provider]  linaclotide (LINZESS) 145 MCG CAPS capsule Take 145 mcg by mouth daily as needed (constipation).   Yes [provider]  OLANZapine (ZYPREXA) 5 MG tablet Take 5 mg by mouth at bedtime.   Yes [provider]  omeprazole (PRILOSEC) 20 MG capsule Take 20 mg by  mouth daily.  08/19/18  Yes [provider]  OXYGEN Inhale 2 L into the lungs at bedtime.   Yes [provider]  Vitamin D, Ergocalciferol, (DRISDOL) 1.25 MG (50000 UT) CAPS capsule Take 50,000 Units by mouth 2 (two) times a week. Tuesday and Friday   Yes [provider]  HYDROcodone-acetaminophen (NORCO/VICODIN) 5-325 MG tablet Take 1 tablet by mouth every 4 (four) hours as needed for moderate pain. Patient not taking: Reported on 04/09/2019 03/20/19 03/19/20  Alyson Ingles, PA-C  potassium chloride (KLOR-CON) 20 MEQ packet Take 20 mEq by mouth 2 (two) times daily. Patient not taking: Reported on 04/09/2019 03/20/19   Alyson Ingles, [place]    Physical Exam: Vitals:   04/09/19 1900 04/09/19 2030 04/09/19 2136 04/10/19 0027  BP: (!) 138/91 (!) 146/100  136/86  Pulse: 94  87 91  Resp: 17 17 13 17   Temp:    98.3 F (36.8 C)  TempSrc:    Oral  SpO2: 94%  97% 98%    Constitutional: NAD, calm, comfortable Vitals:   04/09/19 1900 04/09/19 2030 04/09/19 2136 04/10/19 0027  BP: (!) 138/91 (!) 146/100  136/86  Pulse: 94  87 91  Resp: 17 17 13 17   Temp:    98.3 F (36.8 C)  TempSrc:    Oral  SpO2: 94%  97% 98%   Eyes: PERRL, lids and conjunctivae normal ENMT: Mucous membranes are moist. Posterior pharynx clear of any exudate or lesions.Normal dentition.  Neck: Immobilized hard collar Respiratory: clear to auscultation bilaterally, no wheezing, no crackles. Normal respiratory effort. No accessory muscle use.  Cardiovascular: Regular rate and rhythm, no murmurs / rubs / gallops. No extremity edema. 2+ pedal pulses. No carotid bruits.  Abdomen: no tenderness, no masses palpated. No hepatosplenomegaly. Bowel sounds positive.  Musculoskeletal: no clubbing / cyanosis. No joint deformity upper and lower extremities. Good ROM, no contractures. Normal muscle tone.  Skin: no rashes, lesions, ulcers. No induration Neurologic: CN 2-12 grossly intact. Sensation intact, DTR  normal. Strength 5/5 in all 4.  Unable to evaluate her gait.  She was able  to ambulate with some support per family member Psychiatric: Normal judgment and insight. Alert and oriented x 3. Normal mood.    Labs on Admission: I have personally reviewed following labs and imaging studies  CBC: Recent Labs  Lab 04/09/19 1256 04/09/19 1305  WBC 13.8*  --   NEUTROABS 9.8*  --   HGB 13.8 13.9  HCT 43.9 41.0  MCV 97.8  --   PLT 328  --    Basic Metabolic Panel: Recent Labs  Lab 04/09/19 1256 04/09/19 1305  NA 143 143  K 3.6 3.6  CL 106 108  CO2 25  --   GLUCOSE 114* 109*  BUN 41* 41*  CREATININE 1.13* 1.10*  CALCIUM 9.8  --    GFR: CrCl cannot be calculated (Unknown ideal weight.). Liver Function Tests: Recent Labs  Lab 04/09/19 1256  AST 17  ALT 11  ALKPHOS  82  BILITOT 0.5  PROT 6.6  ALBUMIN 2.8*   No results for input(s): LIPASE, AMYLASE in the last 168 hours. No results for input(s): AMMONIA in the last 168 hours. Coagulation Profile: Recent Labs  Lab 04/09/19 1256  INR 1.0   Cardiac Enzymes: No results for input(s): CKTOTAL, CKMB, CKMBINDEX, TROPONINI in the last 168 hours. BNP (last 3 results) No results for input(s): PROBNP in the last 8760 hours. HbA1C: No results for input(s): HGBA1C in the last 72 hours. CBG: No results for input(s): GLUCAP in the last 168 hours. Lipid Profile: No results for input(s): CHOL, HDL, LDLCALC, TRIG, CHOLHDL, LDLDIRECT in the last 72 hours. Thyroid Function Tests: No results for input(s): TSH, T4TOTAL, FREET4, T3FREE, THYROIDAB in the last 72 hours. Anemia Panel: No results for input(s): VITAMINB12, FOLATE, FERRITIN, TIBC, IRON, RETICCTPCT in the last 72 hours. Urine analysis:    Component Value Date/Time   COLORURINE YELLOW 04/09/2019 1701   APPEARANCEUR CLEAR 04/09/2019 1701   LABSPEC 1.029 04/09/2019 1701   PHURINE 6.0 04/09/2019 1701   GLUCOSEU NEGATIVE 04/09/2019 1701   HGBUR NEGATIVE 04/09/2019 1701    BILIRUBINUR NEGATIVE 04/09/2019 1701   KETONESUR NEGATIVE 04/09/2019 1701   PROTEINUR 30 (A) 04/09/2019 1701   NITRITE NEGATIVE 04/09/2019 1701   LEUKOCYTESUR NEGATIVE 04/09/2019 1701    Radiological Exams on Admission: CT Angio Head W or Wo Contrast  Result Date: 04/09/2019 CLINICAL DATA:  Speech disturbance. Dysarthria. History of brain aneurysm. EXAM: CT ANGIOGRAPHY HEAD AND NECK TECHNIQUE: Multidetector CT imaging of the head and neck was performed using the standard protocol during bolus administration of intravenous contrast. Multiplanar CT image reconstructions and MIPs were obtained to evaluate the vascular anatomy. Carotid stenosis measurements (when applicable) are obtained utilizing NASCET criteria, using the distal internal carotid diameter as the denominator. CONTRAST:  OMNIPAQUE IOHEXOL 350 MG/ML SOLN COMPARISON:  MRI 11/07/2018 FINDINGS: CT HEAD FINDINGS Brain: Age related volume loss. Chronic small-vessel ischemic changes of the cerebral hemispheric white matter. No sign of acute infarction, mass lesion, hemorrhage, hydrocephalus or extra-axial collection. Vascular: There is atherosclerotic calcification of the major vessels at the base of the brain. Skull: Negative Sinuses: Clear Orbits: Normal Review of the MIP images confirms the above findings CTA NECK FINDINGS Aortic arch: Aortic atherosclerosis. Branching pattern is normal without origin stenosis. Right carotid system: Common carotid artery widely patent to the bifurcation. Calcified plaque at the carotid bifurcation and ICA bulb. Minimal diameter in the ICA bulb is 3.5 mm. Compared to a more distal cervical ICA diameter of 5 mm, this indicates a 30% stenosis. Cervical ICA widely patent beyond that. Left carotid system: Common carotid artery widely patent to the bifurcation. Calcified plaque at carotid bifurcation and ICA bulb. No stenosis. Cervical ICA widely patent beyond that. Vertebral arteries: Both vertebral artery origins  are widely patent. Both vertebral arteries are widely patent through the cervical region to the foramen magnum. Skeleton: Previous ACDF C4 through C7. Other neck: No mass or lymphadenopathy. Upper chest: Emphysema.  No focal or active process otherwise. Review of the MIP images confirms the above findings CTA HEAD FINDINGS Anterior circulation: Both internal carotid arteries are widely patent through the skull base and siphon regions. There is atherosclerotic calcification in both carotid siphon regions but without stenosis. There are small atherosclerotic aneurysms in the siphon regions bilaterally, none larger than 2 mm. The anterior and middle cerebral vessels are patent. No large or medium vessel occlusion. Posterior circulation: Both vertebral arteries are patent through the  foramen magnum to the basilar. No basilar stenosis. Posterior circulation branch vessels are patent. Venous sinuses: Patent and normal. Anatomic variants: None significant. Review of the MIP images confirms the above findings IMPRESSION: No intracranial large or medium vessel occlusion. Advanced aortic atherosclerotic disease. Atherosclerotic disease of the carotid bifurcation regions. 30% ICA stenosis on the right. No measurable stenosis on the left. 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 Head CT does not show any acute finding. Chronic small-vessel ischemic changes of the white matter. Electronically Signed   By: Paulina FusiMark  Shogry M.D.   On: 04/09/2019 18:55   CT Angio Neck W and/or Wo Contrast  Result Date: 04/09/2019 CLINICAL DATA:  Speech disturbance. Dysarthria. History of brain aneurysm. EXAM: CT ANGIOGRAPHY HEAD AND NECK TECHNIQUE: Multidetector CT imaging of the head and neck was performed using the standard protocol during bolus administration of intravenous contrast. Multiplanar CT image reconstructions and MIPs were obtained to evaluate the  vascular anatomy. Carotid stenosis measurements (when applicable) are obtained utilizing NASCET criteria, using the distal internal carotid diameter as the denominator. CONTRAST:  100mL OMNIPAQUE IOHEXOL 350 MG/ML SOLN COMPARISON:  MRI 11/07/2018 FINDINGS: CT HEAD FINDINGS Brain: Age related volume loss. Chronic small-vessel ischemic changes of the cerebral hemispheric white matter. No sign of acute infarction, mass lesion, hemorrhage, hydrocephalus or extra-axial collection. Vascular: There is atherosclerotic calcification of the major vessels at the base of the brain. Skull: Negative Sinuses: Clear Orbits: Normal Review of the MIP images confirms the above findings CTA NECK FINDINGS Aortic arch: Aortic atherosclerosis. Branching pattern is normal without origin stenosis. Right carotid system: Common carotid artery widely patent to the bifurcation. Calcified plaque at the carotid bifurcation and ICA bulb. Minimal diameter in the ICA bulb is 3.5 mm. Compared to a more distal cervical ICA diameter of 5 mm, this indicates a 30% stenosis. Cervical ICA widely patent beyond that. Left carotid system: Common carotid artery widely patent to the bifurcation. Calcified plaque at carotid bifurcation and ICA bulb. No stenosis. Cervical ICA widely patent beyond that. Vertebral arteries: Both vertebral artery origins are widely patent. Both vertebral arteries are widely patent through the cervical region to the foramen magnum. Skeleton: Previous ACDF C4 through C7. Other neck: No mass or lymphadenopathy. Upper chest: Emphysema.  No focal or active process otherwise. Review of the MIP images confirms the above findings CTA HEAD FINDINGS Anterior circulation: Both internal carotid arteries are widely patent through the skull base and siphon regions. There is atherosclerotic calcification in both carotid siphon regions but without stenosis. There are small atherosclerotic aneurysms in the siphon regions bilaterally, none larger  than 2 mm. The anterior and middle cerebral vessels are patent. No large or medium vessel occlusion. Posterior circulation: Both vertebral arteries are patent through the foramen magnum to the basilar. No basilar stenosis. Posterior circulation branch vessels are patent. Venous sinuses: Patent and normal. Anatomic variants: None significant. Review of the MIP images confirms the above findings IMPRESSION: No intracranial large or medium vessel occlusion. Advanced aortic atherosclerotic disease. Atherosclerotic disease of the carotid bifurcation regions. 30% ICA stenosis on the right. No measurable stenosis on the left. 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 Head CT does not show any acute finding. Chronic small-vessel ischemic changes of the white matter. Electronically Signed   By: Paulina FusiMark  Shogry M.D.   On: 04/09/2019 18:55   MR Brain  Wo Contrast (neuro protocol)  Result Date: 04/09/2019 CLINICAL DATA:  History of stroke and brain aneurysm. Speech disturbance. EXAM: MRI HEAD WITHOUT CONTRAST TECHNIQUE: Multiplanar, multiecho pulse sequences of the brain and surrounding structures were obtained without intravenous contrast. COMPARISON:  CT angiography same day FINDINGS: Brain: Diffusion imaging shows a punctate acute white matter infarction in the right frontal subcortical white matter. Few small foci of acute infarction affecting right parietal gyral surfaces. Punctate acute infarction of the right parietal deep white matter. This is consistent with micro embolic disease in the right MCA territory. No large or confluent acute infarction. There are chronic small-vessel ischemic changes of the pons and cerebellum. Old small vessel infarctions of the thalami and throughout the cerebral hemispheric white matter. There is an old left occipital cortical infarction. No mass, acute hemorrhage, hydrocephalus or extra-axial collection.  Vascular: Major vessels at the base of the brain show flow. Skull and upper cervical spine: Negative Sinuses/Orbits: Clear/normal Other: None IMPRESSION: Several punctate acute infarctions scattered in the right MCA territory affecting the right frontal and parietal deep white matter and right parietal gyral surfaces. No swelling or hemorrhage. No large confluent acute infarction. Extensive chronic small-vessel ischemic changes elsewhere throughout the brain. Old left occipital infarction. Electronically Signed   By: Nelson Chimes M.D.   On: 04/09/2019 21:45   DG Chest Portable 1 View  Result Date: 04/09/2019 CLINICAL DATA:  Altered mental status elevated white count EXAM: PORTABLE CHEST 1 VIEW COMPARISON:  07/05/2018 FINDINGS: No focal airspace disease or pleural effusion. Mild cardiomegaly with tortuous aorta. Aortic atherosclerosis. No pneumothorax. IMPRESSION: No active disease. Mild cardiomegaly. Electronically Signed   By: Donavan Foil M.D.   On: 04/09/2019 15:19    EKG: Independently reviewed.  Normal sinus no acute ST-T changes  CVA Patient with slurred speech started last Friday and difficulty swallowing Status post C-spine surgery on January 5 was discharged on 1/ 7 on SNF.  Was doing well after surgery but lately per family member she is not able to ambulate without any support.  Before she was able to walk with a walker. Head MRI everal punctate acute infarctions scattered in the right MCA territory affecting the right frontal and parietal deep white matter and right parietal gyral surfaces. No swelling or hemorrhage. No large confluent acute infarction No intracranial large or medium vessel occlusion. Atherosclerotic disease of the carotid bifurcation regions. 30% ICA stenosis on the right. No measurable stenosis on the left. Atherosclerotic disease in both carotid siphon regions with small atherosclerotic aneurysms, none larger than 2 mm on the left .Head/neck  CTA does not show  any acute finding. Chronic small-vessel ischemic changes of the white matter. Plan resume aspirin, statins, OT, physical therapy, speech therapy evaluation for swallowing, neurology consult, echocardiogram CT of the neck no significant stenosis.  Acute kidney injury Secondary to poor oral intake Plan IV fluids, avoid nephrotoxins follow BUN and creatinine  Essential hypertension Patient is n.p.o. 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 Kathyrn Sheriff Plan resume pain medication continue with hard collar Consult neurosurgery for further orders   Assessment/Plan Principal Problem:   CVA (cerebrovascular accident) [facility]) Active Problems:   Hypertension   Neck pain   Cervical myelopathy ([place])   Stenosis  of cervical spine with myelopathy (HCC)   CVA (cerebral vascular accident) (HCC)   Unable to ambulate   AKI (acute kidney injury) (HCC)   Hypothyroidism   Depression      DVT prophylaxis: Lovenox Code Status: Full code Family Communication: Family in the room Disposition Plan: Admission observation Consults called: By emergency room Admission status: Observation   Brock Larmon G Tymere Depuy MD [facility]  If 7PM-7AM, please contact night-coverage [email or web address]   04/10/2019, 12:39 AM

## 2019-04-10 NOTE — Progress Notes (Signed)
  Echocardiogram 2D Echocardiogram has been attempted. Unable to perform complete echo due to patient pushing and hitting scanning arm during echo.   [name] 04/10/2019, 8:31 AM

## 2019-04-10 NOTE — H&P (View-Only) (Signed)
ELECTROPHYSIOLOGY CONSULT NOTE  Patient ID: Maria Blackwell MRN: [id number], DOB/AGE: December 10, 1940   Admit date: 04/09/2019 Date of Consult: 04/11/2019  Primary Physician: Veverly Fells, MD Primary Electrophysiologist: New to Dr. Johney Frame Reason for Consultation: Cryptogenic stroke; recommendations regarding Implantable Loop Recorder Insurance: Medicare  History of Present Illness EP has been asked to evaluate Maria Blackwell for placement of an implantable loop recorder to monitor for atrial fibrillation by Dr Roda Shutters.  The patient was admitted on 04/09/2019 with slurred speech, generalized weakness, and multiple falls. Last normal approx 5 days ago. Of note, she had C-spine surgery on 03/18/19 for multiple cervical stenoses during work up for gait instability. MRI this admission with punctate acute infarctions across the right MCA territory. They have undergone workup for stroke including echocardiogram and CTA Head and Neck with advanced aortic atherosclerotic disease and 30% R-ICA stenosis. No measurable on the left. The patient has been monitored on telemetry which has demonstrated sinus rhythm with no arrhythmias.  Inpatient stroke work-up will not require a TEE per Neurology.   Echocardiogram this admission demonstrated EF 60-65%.  Lab work is reviewed.  Prior to admission, the patient denies chest pain, shortness of breath, dizziness, palpitations, or syncope. She doesn't remember much of what happened just prior to her admission. They are recovering from their stroke with plans to rehab at SNF at discharge.  Past Medical History:  Diagnosis Date   COPD (chronic obstructive pulmonary disease) (HCC)    Depression with anxiety    Hypertension    Hypothyroid    Stroke [facility]) 2015     Surgical History:  Past Surgical History:  Procedure Laterality Date   ABDOMINAL HYSTERECTOMY     ANTERIOR CERVICAL DECOMP/DISCECTOMY FUSION N/A 03/18/2019   Procedure: ANTERIOR CERVICAL  DECOMPRESSION/DISCECTOMY FUSION, CERVICAL FOUR- CERVICAL FIVE, CERVICAL FIVE- CERVICAL SIX, CERVICAL SIX- CERVICAL SEVEN;  Surgeon: Lisbeth Renshaw, MD;  Location: [facility] OR;  Service: Neurosurgery;  Laterality: N/A;   CHOLECYSTECTOMY     EYE SURGERY Right    IR ANGIO INTRA EXTRACRAN SEL COM CAROTID INNOMINATE BILAT MOD SED  09/19/2016   IR ANGIO VERTEBRAL SEL VERTEBRAL UNI L MOD SED  09/19/2016   IR US GUIDE VASC ACCESS RIGHT  09/19/2016   TUMOR REMOVAL       Medications Prior to Admission  Medication Sig Dispense Refill Last Dose   aspirin 325 MG tablet Take 1 tablet (325 mg total) by mouth daily.   04/09/2019 at Unknown time   clobetasol ointment (TEMOVATE) 0.05 % Apply 1 application topically 2 (two) times daily. Up to 3 times daily   04/09/2019 at Unknown time   FLUoxetine (PROZAC) 20 MG tablet Take 20 mg by mouth at bedtime.   04/08/2019 at Unknown time   levothyroxine (SYNTHROID) 112 MCG tablet Take 112 mcg by mouth daily before breakfast.    04/09/2019 at Unknown time   linaclotide (LINZESS) 145 MCG CAPS capsule Take 145 mcg by mouth daily as needed (constipation).   04/09/2019 at Unknown time   OLANZapine (ZYPREXA) 5 MG tablet Take 5 mg by mouth at bedtime.   04/08/2019 at Unknown time   omeprazole (PRILOSEC) 20 MG capsule Take 20 mg by mouth daily.    04/09/2019 at Unknown time   OXYGEN Inhale 2 L into the lungs at bedtime.   04/08/2019 at Unknown time   Vitamin D, Ergocalciferol, (DRISDOL) 1.25 MG (50000 UT) CAPS capsule Take 50,000 Units by mouth 2 (two) times a week. Tuesday and Friday  04/08/2019 at Unknown time   HYDROcodone-acetaminophen (NORCO/VICODIN) 5-325 MG tablet Take 1 tablet by mouth every 4 (four) hours as needed for moderate pain. (Patient not taking: Reported on 04/09/2019) 60 tablet 0 Not Taking at Unknown time   potassium chloride (KLOR-CON) 20 MEQ packet Take 20 mEq by mouth 2 (two) times daily. (Patient not taking: Reported on 04/09/2019) 4 packet 0 Not Taking  at Unknown time    Inpatient Medications:   aspirin EC  81 mg Oral Daily   atorvastatin  20 mg Oral q1800   clobetasol ointment  1 application Topical Daily   clopidogrel  75 mg Oral Daily   enoxaparin (LOVENOX) injection  40 mg Subcutaneous Daily   levothyroxine  112 mcg Oral QAC breakfast   neomycin-bacitracin-polymyxin   Topical BID   pantoprazole  40 mg Oral Daily   Vitamin D (Ergocalciferol)  50,000 Units Oral Once per day on Tue Fri    Allergies:  Allergies  Allergen Reactions   Amoxicillin Rash    Social History   Socioeconomic History   Marital status: Divorced    Spouse name: Not on file   Number of children: Not on file   Years of education: Not on file   Highest education level: Not on file  Occupational History   Not on file  Tobacco Use   Smoking status: Current Every Day Smoker    Packs/day: 1.00    Types: Cigarettes   Smokeless tobacco: Never Used  Substance and Sexual Activity   Alcohol use: No   Drug use: No   Sexual activity: Not on file  Other Topics Concern   Not on file  Social History Narrative   Patient lives at home alone. Is a widow  has 3 children and a high school education.   Patient smokes 1 pack of cigarettes and drinks 1-2 cups of coffee.    Social Determinants of Health   Financial Resource Strain:    Difficulty of Paying Living Expenses: Not on file  Food Insecurity:    Worried About Charity fundraiser in the Last Year: Not on file   [employer] of Food in the Last Year: Not on file  Transportation Needs:    Lack of Transportation (Medical): Not on file   Lack of Transportation (Non-Medical): Not on file  Physical Activity:    Days of Exercise per Week: Not on file   Minutes of Exercise per Session: Not on file  Stress:    Feeling of Stress : Not on file  Social Connections:    Frequency of Communication with Friends and Family: Not on file   Frequency of Social Gatherings with Friends and  Family: Not on file   Attends Religious Services: Not on file   Active Member of Clubs or Organizations: Not on file   Attends Archivist Meetings: Not on file   Marital Status: Not on file  Intimate Partner Violence:    Fear of Current or Ex-Partner: Not on file   Emotionally Abused: Not on file   Physically Abused: Not on file   Sexually Abused: Not on file     Family History  Problem Relation Age of Onset   Cancer Mother    Cancer Father    Colon cancer Neg Hx       Review of Systems: All other systems reviewed and are otherwise negative except as noted above.  Physical Exam: Vitals:   04/10/19 2041 04/10/19 2331 04/11/19 0347 04/11/19 0700  BP: (!) 159/97 (!) 143/97 (!) 144/87 (!) 155/87  Pulse: 91 98 87 94  Resp: 20 17 19 18   Temp: 97.7 F (36.5 C) 97.8 F (36.6 C) 98.1 F (36.7 C) 98.1 F (36.7 C)  TempSrc: Oral Oral Oral Axillary  SpO2: 98% 100% 99% 100%    GEN- The patient is elderly appearing, alert and oriented x 3 today.   Head- normocephalic, atraumatic Eyes-  Sclera clear, conjunctiva pink Ears- hearing intact Oropharynx- clear Neck- C-Collar in place Lungs- Clear to ausculation bilaterally, normal work of breathing Heart- Regular rate and rhythm, no murmurs, rubs or gallops  GI- soft, NT, ND, + BS Extremities- no clubbing, cyanosis, or edema MS- no significant deformity or atrophy Skin- no rash or lesion Psych- euthymic mood, full affect   Labs:   Lab Results  Component Value Date   WBC 8.8 04/11/2019   HGB 11.8 (L) 04/11/2019   HCT 36.2 04/11/2019   MCV 95.5 04/11/2019   PLT 253 04/11/2019    Recent Labs  Lab 04/11/19 0213  NA 140  K 3.9  CL 105  CO2 28  BUN 21  CREATININE 0.88  CALCIUM 8.6*  PROT 5.2*  BILITOT 0.8  ALKPHOS 72  ALT 9  AST 20  GLUCOSE 100*     Radiology/Studies: CT Angio Head W or Wo Contrast  Result Date: 04/09/2019 CLINICAL DATA:  Speech disturbance. Dysarthria. History of brain  aneurysm. EXAM: CT ANGIOGRAPHY HEAD AND NECK TECHNIQUE: Multidetector CT imaging of the head and neck was performed using the standard protocol during bolus administration of intravenous contrast. Multiplanar CT image reconstructions and MIPs were obtained to evaluate the vascular anatomy. Carotid stenosis measurements (when applicable) are obtained utilizing NASCET criteria, using the distal internal carotid diameter as the denominator. CONTRAST:  OMNIPAQUE IOHEXOL 350 MG/ML SOLN COMPARISON:  MRI 11/07/2018 FINDINGS: CT HEAD FINDINGS Brain: Age related volume loss. Chronic small-vessel ischemic changes of the cerebral hemispheric white matter. No sign of acute infarction, mass lesion, hemorrhage, hydrocephalus or extra-axial collection. Vascular: There is atherosclerotic calcification of the major vessels at the base of the brain. Skull: Negative Sinuses: Clear Orbits: Normal Review of the MIP images confirms the above findings CTA NECK FINDINGS Aortic arch: Aortic atherosclerosis. Branching pattern is normal without origin stenosis. Right carotid system: Common carotid artery widely patent to the bifurcation. Calcified plaque at the carotid bifurcation and ICA bulb. Minimal diameter in the ICA bulb is 3.5 mm. Compared to a more distal cervical ICA diameter of 5 mm, this indicates a 30% stenosis. Cervical ICA widely patent beyond that. Left carotid system: Common carotid artery widely patent to the bifurcation. Calcified plaque at carotid bifurcation and ICA bulb. No stenosis. Cervical ICA widely patent beyond that. Vertebral arteries: Both vertebral artery origins are widely patent. Both vertebral arteries are widely patent through the cervical region to the foramen magnum. Skeleton: Previous ACDF C4 through C7. Other neck: No mass or lymphadenopathy. Upper chest: Emphysema.  No focal or active process otherwise. Review of the MIP images confirms the above findings CTA HEAD FINDINGS Anterior circulation:  Both internal carotid arteries are widely patent through the skull base and siphon regions. There is atherosclerotic calcification in both carotid siphon regions but without stenosis. There are small atherosclerotic aneurysms in the siphon regions bilaterally, none larger than 2 mm. The anterior and middle cerebral vessels are patent. No large or medium vessel occlusion. Posterior circulation: Both vertebral arteries are patent through the foramen magnum to the basilar. No  basilar stenosis. Posterior circulation branch vessels are patent. Venous sinuses: Patent and normal. Anatomic variants: None significant. Review of the MIP images confirms the above findings IMPRESSION: No intracranial large or medium vessel occlusion. Advanced aortic atherosclerotic disease. Atherosclerotic disease of the carotid bifurcation regions. 30% ICA stenosis on the right. No measurable stenosis on the left. 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 Head CT does not show any acute finding. Chronic small-vessel ischemic changes of the white matter. Electronically Signed   By: Paulina Fusi M.D.   On: 04/09/2019 18:55   DG Cervical Spine 2-3 Views  Result Date: 03/18/2019 CLINICAL DATA:  C4-7 ACDF. EXAM: DG C-ARM 1-60 MIN; CERVICAL SPINE - 2-3 VIEW COMPARISON:  Cervical MRI 11/07/2018. FINDINGS: C-arm fluoroscopy was provided in the operating room. 4 seconds of fluoroscopy time. 3 spot lateral fluoroscopic images are submitted. The initial image demonstrates anterior localization of the C5-6 disc space. Subsequent views demonstrate anterior discectomy and fusion from C4 through C7 with an anterior plate, screws and interbody spacers. The hardware appears well positioned. No complications are identified. Surgical sponges remain anteriorly in the surgical bed. IMPRESSION: Intraoperative views during C4-7 ACDF. No demonstrated complication. Electronically  Signed   By: Carey Bullocks M.D.   On: 03/18/2019 13:42   CT Angio Neck W and/or Wo Contrast  Result Date: 04/09/2019 CLINICAL DATA:  Speech disturbance. Dysarthria. History of brain aneurysm. EXAM: CT ANGIOGRAPHY HEAD AND NECK TECHNIQUE: Multidetector CT imaging of the head and neck was performed using the standard protocol during bolus administration of intravenous contrast. Multiplanar CT image reconstructions and MIPs were obtained to evaluate the vascular anatomy. Carotid stenosis measurements (when applicable) are obtained utilizing NASCET criteria, using the distal internal carotid diameter as the denominator. CONTRAST:  OMNIPAQUE IOHEXOL 350 MG/ML SOLN COMPARISON:  MRI 11/07/2018 FINDINGS: CT HEAD FINDINGS Brain: Age related volume loss. Chronic small-vessel ischemic changes of the cerebral hemispheric white matter. No sign of acute infarction, mass lesion, hemorrhage, hydrocephalus or extra-axial collection. Vascular: There is atherosclerotic calcification of the major vessels at the base of the brain. Skull: Negative Sinuses: Clear Orbits: Normal Review of the MIP images confirms the above findings CTA NECK FINDINGS Aortic arch: Aortic atherosclerosis. Branching pattern is normal without origin stenosis. Right carotid system: Common carotid artery widely patent to the bifurcation. Calcified plaque at the carotid bifurcation and ICA bulb. Minimal diameter in the ICA bulb is 3.5 mm. Compared to a more distal cervical ICA diameter of 5 mm, this indicates a 30% stenosis. Cervical ICA widely patent beyond that. Left carotid system: Common carotid artery widely patent to the bifurcation. Calcified plaque at carotid bifurcation and ICA bulb. No stenosis. Cervical ICA widely patent beyond that. Vertebral arteries: Both vertebral artery origins are widely patent. Both vertebral arteries are widely patent through the cervical region to the foramen magnum. Skeleton: Previous ACDF C4 through C7. Other neck:  No mass or lymphadenopathy. Upper chest: Emphysema.  No focal or active process otherwise. Review of the MIP images confirms the above findings CTA HEAD FINDINGS Anterior circulation: Both internal carotid arteries are widely patent through the skull base and siphon regions. There is atherosclerotic calcification in both carotid siphon regions but without stenosis. There are small atherosclerotic aneurysms in the siphon regions bilaterally, none larger than 2 mm. The anterior and middle cerebral vessels are patent. No large or medium vessel occlusion. Posterior circulation: Both vertebral arteries are patent through the  foramen magnum to the basilar. No basilar stenosis. Posterior circulation branch vessels are patent. Venous sinuses: Patent and normal. Anatomic variants: None significant. Review of the MIP images confirms the above findings IMPRESSION: No intracranial large or medium vessel occlusion. Advanced aortic atherosclerotic disease. Atherosclerotic disease of the carotid bifurcation regions. 30% ICA stenosis on the right. No measurable stenosis on the left. 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 Head CT does not show any acute finding. Chronic small-vessel ischemic changes of the white matter. Electronically Signed   By: Paulina FusiMark  Shogry M.D.   On: 04/09/2019 18:55   MR Brain Wo Contrast (neuro protocol)  Result Date: 04/09/2019 CLINICAL DATA:  History of stroke and brain aneurysm. Speech disturbance. EXAM: MRI HEAD WITHOUT CONTRAST TECHNIQUE: Multiplanar, multiecho pulse sequences of the brain and surrounding structures were obtained without intravenous contrast. COMPARISON:  CT angiography same day FINDINGS: Brain: Diffusion imaging shows a punctate acute white matter infarction in the right frontal subcortical white matter. Few small foci of acute infarction affecting right parietal gyral surfaces. Punctate acute  infarction of the right parietal deep white matter. This is consistent with micro embolic disease in the right MCA territory. No large or confluent acute infarction. There are chronic small-vessel ischemic changes of the pons and cerebellum. Old small vessel infarctions of the thalami and throughout the cerebral hemispheric white matter. There is an old left occipital cortical infarction. No mass, acute hemorrhage, hydrocephalus or extra-axial collection. Vascular: Major vessels at the base of the brain show flow. Skull and upper cervical spine: Negative Sinuses/Orbits: Clear/normal Other: None IMPRESSION: Several punctate acute infarctions scattered in the right MCA territory affecting the right frontal and parietal deep white matter and right parietal gyral surfaces. No swelling or hemorrhage. No large confluent acute infarction. Extensive chronic small-vessel ischemic changes elsewhere throughout the brain. Old left occipital infarction. Electronically Signed   By: Paulina FusiMark  Shogry M.D.   On: 04/09/2019 21:45   DG Chest Portable 1 View  Result Date: 04/09/2019 CLINICAL DATA:  Altered mental status elevated white count EXAM: PORTABLE CHEST 1 VIEW COMPARISON:  07/05/2018 FINDINGS: No focal airspace disease or pleural effusion. Mild cardiomegaly with tortuous aorta. Aortic atherosclerosis. No pneumothorax. IMPRESSION: No active disease. Mild cardiomegaly. Electronically Signed   By: Jasmine PangKim  Fujinaga M.D.   On: 04/09/2019 15:19   DG C-Arm 1-60 Min  Result Date: 03/18/2019 CLINICAL DATA:  C4-7 ACDF. EXAM: DG C-ARM 1-60 MIN; CERVICAL SPINE - 2-3 VIEW COMPARISON:  Cervical MRI 11/07/2018. FINDINGS: C-arm fluoroscopy was provided in the operating room. 4 seconds of fluoroscopy time. 3 spot lateral fluoroscopic images are submitted. The initial image demonstrates anterior localization of the C5-6 disc space. Subsequent views demonstrate anterior discectomy and fusion from C4 through C7 with an anterior plate, screws and  interbody spacers. The hardware appears well positioned. No complications are identified. Surgical sponges remain anteriorly in the surgical bed. IMPRESSION: Intraoperative views during C4-7 ACDF. No demonstrated complication. Electronically Signed   By: Carey BullocksWilliam  Veazey M.D.   On: 03/18/2019 13:42   ECHOCARDIOGRAM COMPLETE  Result Date: 04/10/2019   ECHOCARDIOGRAM REPORT   Patient Name:   Maria GaleGLYNDA J Ludlam Date of Exam: 04/10/2019 Medical Rec #:  [id number]        Height:       62.0 in Accession #:    [id number]       Weight:       138.0 lb Date of Birth:  01-28-1941        BSA:          1.63 m Patient Age:    78 years         BP:           127/93 mmHg Patient Gender: F                HR:           90 bpm. Exam Location:  Inpatient Procedure: 2D Echo Indications:    Stroke  History:        Patient has prior history of Echocardiogram examinations, most                 recent 12/04/2018.  Sonographer:    Ross Ludwig RDCS (AE) Referring Phys: [id number] [facility]  Sonographer Comments: Suboptimal parasternal window. Image acquisition challenging due to patient behavioral factors. and Image acquisition challenging due to uncooperative patient. Unable to complete echo due to patient pushing and hitting my left arm during echo. IMPRESSIONS  1. Left ventricular ejection fraction, by visual estimation, is 60 to 65%. The left ventricle has normal function. There is no left ventricular hypertrophy.  2. The left ventricle has no regional wall motion abnormalities.  3. Incomplete study due to lack of patient cooperation. Visualized segments (anteroseptal and inferolateral myocardium) demonstrated normal systolic function.  4. Global right ventricle was not assessed.The right ventricular size is not assessed. Right vetricular wall thickness was not assessed.  5. Left atrial size was normal.  6. Right atrial size was not well visualized.  7. The mitral valve is normal in structure. No evidence of mitral valve regurgitation.  No evidence of mitral stenosis.  8. The tricuspid valve is not assessed.  9. The tricuspid valve is not assessed. Tricuspid valve regurgitation is not demonstrated. 10. The aortic valve was not assessed. Aortic valve regurgitation is not visualized. No evidence of aortic valve sclerosis or stenosis. 11. The pulmonic valve was not assessed. Pulmonic valve regurgitation is not visualized. 12. The aortic root was not well visualized, the ascending aorta was not well visualized and the aortic arch was not well visualized. 13. TR signal is inadequate for assessing pulmonary artery systolic pressure. 14. The interatrial septum was not assessed. FINDINGS  Left Ventricle: Left ventricular ejection fraction, by visual estimation, is 60 to 65%. The left ventricle has normal function. The left ventricle has no regional wall motion abnormalities. There is no left ventricular hypertrophy. Normal left atrial pressure. Incomplete study due to lack of patient cooperation. Visualized segments (anteroseptal and inferolateral myocardium) demonstrated normal systolic function. Right Ventricle: The right ventricular size is not assessed. Right vetricular wall thickness was not assessed. Global RV systolic function is was not assessed. Left Atrium: Left atrial size was normal in size. Right Atrium: Right atrial size was not well visualized Pericardium: There is no evidence of pericardial effusion. Mitral Valve: The mitral valve is normal in structure. No evidence of mitral valve regurgitation. No evidence of mitral valve stenosis by observation. Tricuspid Valve: The tricuspid valve is not assessed. Tricuspid valve regurgitation is not demonstrated. Aortic Valve: The aortic valve was not assessed. Aortic valve regurgitation is not visualized. The aortic valve is structurally normal, with no evidence of sclerosis or stenosis. Pulmonic Valve: The pulmonic valve was not assessed. Pulmonic valve regurgitation is not visualized. Pulmonic  regurgitation is not visualized. Aorta: The aortic root was not well visualized, the ascending aorta was not well visualized and the aortic  arch was not well visualized. Venous: The inferior vena cava was not well visualized. IAS/Shunts: The interatrial septum was not assessed. There is no evidence of a patent foramen ovale.  Chilton Siiffany [place] MD Electronically signed by Chilton Siiffany [place] MD Signature Date/Time: 04/10/2019/11:49:24 AM    Final    VAS US LOWER EXTREMITY VENOUS (DVT)  Result Date: 04/10/2019  Lower Venous Study Indications: Stroke.  Comparison Study: no prior Performing Technologist: Blanch MediaMegan Riddle RVS  Examination Guidelines: A complete evaluation includes B-mode imaging, spectral Doppler, color Doppler, and power Doppler as needed of all accessible portions of each vessel. Bilateral testing is considered an integral part of a complete examination. Limited examinations for reoccurring indications may be performed as noted.  +---------+---------------+---------+-----------+----------+--------------+  RIGHT     Compressibility Phasicity Spontaneity Properties Thrombus Aging  +---------+---------------+---------+-----------+----------+--------------+  CFV       Full            Yes       Yes                                    +---------+---------------+---------+-----------+----------+--------------+  SFJ       Full                                                             +---------+---------------+---------+-----------+----------+--------------+  FV Prox   Full                                                             +---------+---------------+---------+-----------+----------+--------------+  FV Mid    Full                                                             +---------+---------------+---------+-----------+----------+--------------+  FV Distal Full                                                             +---------+---------------+---------+-----------+----------+--------------+  PFV        Full                                                             +---------+---------------+---------+-----------+----------+--------------+  POP       Full            Yes       Yes                                    +---------+---------------+---------+-----------+----------+--------------+  PTV       Full                                                             +---------+---------------+---------+-----------+----------+--------------+  PERO      Full                                                             +---------+---------------+---------+-----------+----------+--------------+   +---------+---------------+---------+-----------+----------+--------------+  LEFT      Compressibility Phasicity Spontaneity Properties Thrombus Aging  +---------+---------------+---------+-----------+----------+--------------+  CFV       Full            Yes       Yes                                    +---------+---------------+---------+-----------+----------+--------------+  SFJ       Full                                                             +---------+---------------+---------+-----------+----------+--------------+  FV Prox   Full                                                             +---------+---------------+---------+-----------+----------+--------------+  FV Mid    Full                                                             +---------+---------------+---------+-----------+----------+--------------+  FV Distal Full                                                             +---------+---------------+---------+-----------+----------+--------------+  PFV       Full                                                             +---------+---------------+---------+-----------+----------+--------------+  POP       Full            Yes       Yes                                    +---------+---------------+---------+-----------+----------+--------------+  PTV       Full                                                              +---------+---------------+---------+-----------+----------+--------------+  PERO      Full                                                             +---------+---------------+---------+-----------+----------+--------------+     Summary: Right: There is no evidence of deep vein thrombosis in the lower extremity. No cystic structure found in the popliteal fossa. Left: There is no evidence of deep vein thrombosis in the lower extremity. No cystic structure found in the popliteal fossa.  *See table(s) above for measurements and observations. Electronically signed by Sherald Hess MD on 04/10/2019 at 4:29:03 PM.    Final     12-lead ECG 1/27 showed sinus tach at 108 bpm with PR 128 ms (personally reviewed) All prior EKG's in EPIC reviewed with no documented atrial fibrillation  Telemetry NSR 90s (personally reviewed)  Assessment and Plan:  1. Cryptogenic stroke The patient presents with cryptogenic stroke.  The patient does not have a TEE planned for this AM.  I spoke at length with the patient about monitoring for afib with an implantable loop recorder.  Risks, benefits, and alteratives to implantable loop recorder were discussed with the patient today.   At this time, the patient is very clear in their decision to proceed with implantable loop recorder.   2. Recent C-spine surgery Will discuss with MD to make sure this will not interfere with placement re: C-collar in place  Wound care was reviewed with the patient (keep incision clean and dry for 3 days).  Wound check scheduled and entered in AVS. Please call with questions.   Graciella Freer, PA-C 04/11/2019 10:12 AM  I have seen, examined the patient, and reviewed the above assessment and plan.  Changes to above are made where necessary.  On exam, RRR.  I agree with Dr Roda Shutters that ILR is indicated for further evaluation of atrial fibrillation as a possible cause of her stroke. I spoke at length with the patient  about monitoring for afib an implantable loop recorder.  Risks, benefits, and alteratives to implantable loop recorder were discussed with the patient today.   At this time, the patient is very clear in their decision to proceed with implantable loop recorder.   Please call with questions.   Co Sign: Hillis Range, MD 04/11/2019 12:57 PM

## 2019-04-10 NOTE — TOC Progression Note (Signed)
Transition of Care [facility]) - Progression Note    Patient Details  Name: SHAKITA KEIR MRN: [id number] Date of Birth: [birth year]  [facility] CM/SW Contact  Terrilee Croak, Student-Social Work Phone Number: 04/10/2019, 3:18 PM  Clinical Narrative:     MSW Intern went to speak with pt to discuss her preferences and goals for after discharge. Pt's daughter was at bedside, and confirmed that they would like for the pt to return to [facility] when medically ready. Daughter noted that they are currently pending medicaid for pt, they are just waiting on paperwork. Pt information will be sent to [facility] for review. SW will continue to follow.       Expected Discharge Plan and Services                                                 Social Determinants of Health (SDOH) Interventions    Readmission Risk Interventions No flowsheet data found.

## 2019-04-11 ENCOUNTER — Encounter (HOSPITAL_COMMUNITY)
Admission: EM | Disposition: A | Discharge status: Skilled Nursing Facility | Payer: Self-pay | Source: Skilled Nursing Facility | Attending: Internal Medicine

## 2019-04-11 DIAGNOSIS — I6389 Other cerebral infarction: Secondary | ICD-10-CM

## 2019-04-11 DIAGNOSIS — Z66 Do not resuscitate: Secondary | ICD-10-CM

## 2019-04-11 HISTORY — PX: LOOP RECORDER INSERTION: EP1214

## 2019-04-11 LAB — CBC
HCT: 36.2 % (ref 36.0–46.0)
Hemoglobin: 11.8 g/dL — ABNORMAL LOW (ref 12.0–15.0)
MCH: 31.1 pg (ref 26.0–34.0)
MCHC: 32.6 g/dL (ref 30.0–36.0)
MCV: 95.5 fL (ref 80.0–100.0)
Platelets: 253 10*3/uL (ref 150–400)
RBC: 3.79 MIL/uL — ABNORMAL LOW (ref 3.87–5.11)
RDW: 13.4 % (ref 11.5–15.5)
WBC: 8.8 10*3/uL (ref 4.0–10.5)
nRBC: 0 % (ref 0.0–0.2)

## 2019-04-11 LAB — COMPREHENSIVE METABOLIC PANEL WITH GFR
ALT: 9 U/L (ref 0–44)
AST: 20 U/L (ref 15–41)
Albumin: 2.4 g/dL — ABNORMAL LOW (ref 3.5–5.0)
Alkaline Phosphatase: 72 U/L (ref 38–126)
Anion gap: 7 (ref 5–15)
BUN: 21 mg/dL (ref 8–23)
CO2: 28 mmol/L (ref 22–32)
Calcium: 8.6 mg/dL — ABNORMAL LOW (ref 8.9–10.3)
Chloride: 105 mmol/L (ref 98–111)
Creatinine, Ser: 0.88 mg/dL (ref 0.44–1.00)
GFR calc Af Amer: >60 mL/min
GFR calc non Af Amer: >60 mL/min
Glucose, Bld: 100 mg/dL — ABNORMAL HIGH (ref 70–99)
Potassium: 3.9 mmol/L (ref 3.5–5.1)
Sodium: 140 mmol/L (ref 135–145)
Total Bilirubin: 0.8 mg/dL (ref 0.3–1.2)
Total Protein: 5.2 g/dL — ABNORMAL LOW (ref 6.5–8.1)

## 2019-04-11 SURGERY — LOOP RECORDER INSERTION

## 2019-04-11 MED ORDER — ASPIRIN 81 MG PO TBEC: 81.0000 mg | DELAYED_RELEASE_TABLET | Freq: Every day | ORAL | Status: DC

## 2019-04-11 MED ORDER — BUPIVACAINE HCL (PF) 0.25 % IJ SOLN
INTRAMUSCULAR | Status: DC | PRN
  Administered 2019-04-11: 20 mL

## 2019-04-11 MED ORDER — ATORVASTATIN CALCIUM 20 MG PO TABS: 20.0000 mg | ORAL_TABLET | Freq: Every day | ORAL | Status: AC

## 2019-04-11 MED ORDER — LIDOCAINE-EPINEPHRINE 1 %-1:100000 IJ SOLN
INTRAMUSCULAR | Status: AC
  Filled 2019-04-11: qty 1

## 2019-04-11 MED ORDER — CLOPIDOGREL BISULFATE 75 MG PO TABS: 75.0000 mg | ORAL_TABLET | Freq: Every day | ORAL | Status: AC

## 2019-04-11 MED ORDER — MAGNESIUM 30 MG PO TABS: 30.0000 mg | ORAL_TABLET | Freq: Two times a day (BID) | ORAL | Status: DC

## 2019-04-11 SURGICAL SUPPLY — 2 items
MONITOR REVEAL LINQ II (Prosthesis & Implant Heart) ×2 IMPLANT
PACK LOOP INSERTION (CUSTOM PROCEDURE TRAY) ×3 IMPLANT

## 2019-04-11 NOTE — Progress Notes (Signed)
STROKE TEAM PROGRESS NOTE   INTERVAL HISTORY Daughter at bedside.  Patient still on c-collar, awake alert, orientated, still lethargic but no significant focal neuro deficit.  Had loop recorder placed.  Vitals:   04/10/19 2331 04/11/19 0347 04/11/19 0700 04/11/19 1127  BP: (!) 143/97 (!) 144/87 (!) 155/87 (!) 173/100  Pulse: 98 87 94 99  Resp: 17 19 18 17   Temp: 97.8 F (36.6 C) 98.1 F (36.7 C) 98.1 F (36.7 C) 97.6 F (36.4 C)  TempSrc: Oral Oral Axillary Axillary  SpO2: 100% 99% 100% 100%    CBC:  Recent Labs  Lab 04/09/19 1256 04/09/19 1305 04/10/19 0307 04/11/19 0213  WBC 13.8*   < > 9.4 8.8  NEUTROABS 9.8*  --   --   --   HGB 13.8   < > 11.6* 11.8*  HCT 43.9   < > 35.5* 36.2  MCV 97.8   < > 95.2 95.5  PLT 328   < > 275 253   < > = values in this interval not displayed.    Basic Metabolic Panel:  Recent Labs  Lab 04/10/19 0307 04/11/19 0213  NA 141 140  K 3.2* 3.9  CL 108 105  CO2 25 28  GLUCOSE 119* 100*  BUN 34* 21  CREATININE 0.97 0.88  CALCIUM 8.8* 8.6*  MG 1.8  --    Lipid Panel:     Component Value Date/Time   CHOL 99 04/10/2019 0307   TRIG 100 04/10/2019 0307   HDL 21 (L) 04/10/2019 0307   CHOLHDL 4.7 04/10/2019 0307   VLDL 20 04/10/2019 0307   LDLCALC 58 04/10/2019 0307   HgbA1c:  Lab Results  Component Value Date   HGBA1C 5.7 (H) 04/10/2019   Urine Drug Screen: No results found for: LABOPIA, COCAINSCRNUR, LABBENZ, AMPHETMU, THCU, LABBARB  Alcohol Level No results found for: ETH  IMAGING past 48 hours CT Angio Head W or Wo Contrast  Result Date: 04/09/2019 CLINICAL DATA:  Speech disturbance. Dysarthria. History of brain aneurysm. EXAM: CT ANGIOGRAPHY HEAD AND NECK TECHNIQUE: Multidetector CT imaging of the head and neck was performed using the standard protocol during bolus administration of intravenous contrast. Multiplanar CT image reconstructions and MIPs were obtained to evaluate the vascular anatomy. Carotid stenosis  measurements (when applicable) are obtained utilizing NASCET criteria, using the distal internal carotid diameter as the denominator. CONTRAST:  04/11/2019 OMNIPAQUE IOHEXOL 350 MG/ML SOLN COMPARISON:  MRI 11/07/2018 FINDINGS: CT HEAD FINDINGS Brain: Age related volume loss. Chronic small-vessel ischemic changes of the cerebral hemispheric white matter. No sign of acute infarction, mass lesion, hemorrhage, hydrocephalus or extra-axial collection. Vascular: There is atherosclerotic calcification of the major vessels at the base of the brain. Skull: Negative Sinuses: Clear Orbits: Normal Review of the MIP images confirms the above findings CTA NECK FINDINGS Aortic arch: Aortic atherosclerosis. Branching pattern is normal without origin stenosis. Right carotid system: Common carotid artery widely patent to the bifurcation. Calcified plaque at the carotid bifurcation and ICA bulb. Minimal diameter in the ICA bulb is 3.5 mm. Compared to a more distal cervical ICA diameter of 5 mm, this indicates a 30% stenosis. Cervical ICA widely patent beyond that. Left carotid system: Common carotid artery widely patent to the bifurcation. Calcified plaque at carotid bifurcation and ICA bulb. No stenosis. Cervical ICA widely patent beyond that. Vertebral arteries: Both vertebral artery origins are widely patent. Both vertebral arteries are widely patent through the cervical region to the foramen magnum. Skeleton: Previous ACDF C4 through  C7. Other neck: No mass or lymphadenopathy. Upper chest: Emphysema.  No focal or active process otherwise. Review of the MIP images confirms the above findings CTA HEAD FINDINGS Anterior circulation: Both internal carotid arteries are widely patent through the skull base and siphon regions. There is atherosclerotic calcification in both carotid siphon regions but without stenosis. There are small atherosclerotic aneurysms in the siphon regions bilaterally, none larger than 2 mm. The anterior and middle  cerebral vessels are patent. No large or medium vessel occlusion. Posterior circulation: Both vertebral arteries are patent through the foramen magnum to the basilar. No basilar stenosis. Posterior circulation branch vessels are patent. Venous sinuses: Patent and normal. Anatomic variants: None significant. Review of the MIP images confirms the above findings IMPRESSION: No intracranial large or medium vessel occlusion. Advanced aortic atherosclerotic disease. Atherosclerotic disease of the carotid bifurcation regions. 30% ICA stenosis on the right. No measurable stenosis on the left. 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 Head CT does not show any acute finding. Chronic small-vessel ischemic changes of the white matter. Electronically Signed   By: Paulina Fusi M.D.   On: 04/09/2019 18:55   CT Angio Neck W and/or Wo Contrast  Result Date: 04/09/2019 CLINICAL DATA:  Speech disturbance. Dysarthria. History of brain aneurysm. EXAM: CT ANGIOGRAPHY HEAD AND NECK TECHNIQUE: Multidetector CT imaging of the head and neck was performed using the standard protocol during bolus administration of intravenous contrast. Multiplanar CT image reconstructions and MIPs were obtained to evaluate the vascular anatomy. Carotid stenosis measurements (when applicable) are obtained utilizing NASCET criteria, using the distal internal carotid diameter as the denominator. CONTRAST:  OMNIPAQUE IOHEXOL 350 MG/ML SOLN COMPARISON:  MRI 11/07/2018 FINDINGS: CT HEAD FINDINGS Brain: Age related volume loss. Chronic small-vessel ischemic changes of the cerebral hemispheric white matter. No sign of acute infarction, mass lesion, hemorrhage, hydrocephalus or extra-axial collection. Vascular: There is atherosclerotic calcification of the major vessels at the base of the brain. Skull: Negative Sinuses: Clear Orbits: Normal Review of the MIP images confirms  the above findings CTA NECK FINDINGS Aortic arch: Aortic atherosclerosis. Branching pattern is normal without origin stenosis. Right carotid system: Common carotid artery widely patent to the bifurcation. Calcified plaque at the carotid bifurcation and ICA bulb. Minimal diameter in the ICA bulb is 3.5 mm. Compared to a more distal cervical ICA diameter of 5 mm, this indicates a 30% stenosis. Cervical ICA widely patent beyond that. Left carotid system: Common carotid artery widely patent to the bifurcation. Calcified plaque at carotid bifurcation and ICA bulb. No stenosis. Cervical ICA widely patent beyond that. Vertebral arteries: Both vertebral artery origins are widely patent. Both vertebral arteries are widely patent through the cervical region to the foramen magnum. Skeleton: Previous ACDF C4 through C7. Other neck: No mass or lymphadenopathy. Upper chest: Emphysema.  No focal or active process otherwise. Review of the MIP images confirms the above findings CTA HEAD FINDINGS Anterior circulation: Both internal carotid arteries are widely patent through the skull base and siphon regions. There is atherosclerotic calcification in both carotid siphon regions but without stenosis. There are small atherosclerotic aneurysms in the siphon regions bilaterally, none larger than 2 mm. The anterior and middle cerebral vessels are patent. No large or medium vessel occlusion. Posterior circulation: Both vertebral arteries are patent through the foramen magnum to the basilar. No basilar stenosis. Posterior circulation branch vessels are patent. Venous sinuses: Patent and normal. Anatomic variants:  None significant. Review of the MIP images confirms the above findings IMPRESSION: No intracranial large or medium vessel occlusion. Advanced aortic atherosclerotic disease. Atherosclerotic disease of the carotid bifurcation regions. 30% ICA stenosis on the right. No measurable stenosis on the left. Atherosclerotic disease in both  carotid siphon regions with small atherosclerotic aneurysms, none larger than 2 mm on the left. Head CT does not show any acute finding. Chronic small-vessel ischemic changes of the white matter. Electronically Signed   By: Nelson Chimes M.D.   On: 04/09/2019 18:55   MR Brain Wo Contrast (neuro protocol)  Result Date: 04/09/2019 CLINICAL DATA:  History of stroke and brain aneurysm. Speech disturbance. EXAM: MRI HEAD WITHOUT CONTRAST TECHNIQUE: Multiplanar, multiecho pulse sequences of the brain and surrounding structures were obtained without intravenous contrast. COMPARISON:  CT angiography same day FINDINGS: Brain: Diffusion imaging shows a punctate acute white matter infarction in the right frontal subcortical white matter. Few small foci of acute infarction affecting right parietal gyral surfaces. Punctate acute infarction of the right parietal deep white matter. This is consistent with micro embolic disease in the right MCA territory. No large or confluent acute infarction. There are chronic small-vessel ischemic changes of the pons and cerebellum. Old small vessel infarctions of the thalami and throughout the cerebral hemispheric white matter. There is an old left occipital cortical infarction. No mass, acute hemorrhage, hydrocephalus or extra-axial collection. Vascular: Major vessels at the base of the brain show flow. Skull and upper cervical spine: Negative Sinuses/Orbits: Clear/normal Other: None IMPRESSION: Several punctate acute infarctions scattered in the right MCA territory affecting the right frontal and parietal deep white matter and right parietal gyral surfaces. No swelling or hemorrhage. No large confluent acute infarction. Extensive chronic small-vessel ischemic changes elsewhere throughout the brain. Old left occipital infarction. Electronically Signed   By: Nelson Chimes M.D.   On: 04/09/2019 21:45   DG Chest Portable 1 View  Result Date: 04/09/2019 CLINICAL DATA:  Altered mental status  elevated white count EXAM: PORTABLE CHEST 1 VIEW COMPARISON:  07/05/2018 FINDINGS: No focal airspace disease or pleural effusion. Mild cardiomegaly with tortuous aorta. Aortic atherosclerosis. No pneumothorax. IMPRESSION: No active disease. Mild cardiomegaly. Electronically Signed   By: Donavan Foil M.D.   On: 04/09/2019 15:19   ECHOCARDIOGRAM COMPLETE  Result Date: 04/10/2019   ECHOCARDIOGRAM REPORT   Patient Name:   Maria Blackwell Date of Exam: 04/10/2019 Medical Rec #:  [id number]        Height:       62.0 in Accession #:    [id number]       Weight:       138.0 lb Date of Birth:  October 24, 1940        BSA:          1.63 m Patient Age:    79 years         BP:           127/93 mmHg Patient Gender: F                HR:           90 bpm. Exam Location:  Inpatient Procedure: 2D Echo Indications:    Stroke  History:        Patient has prior history of Echocardiogram examinations, most                 recent 12/04/2018.  Sonographer:    Clayton Lefort RDCS (AE) Referring Phys: [id number] [place]  Sonographer Comments: Suboptimal parasternal window. Image acquisition challenging due to patient behavioral factors. and Image acquisition challenging due to uncooperative patient. Unable to complete echo due to patient pushing and hitting my left arm during echo. IMPRESSIONS  1. Left ventricular ejection fraction, by visual estimation, is 60 to 65%. The left ventricle has normal function. There is no left ventricular hypertrophy.  2. The left ventricle has no regional wall motion abnormalities.  3. Incomplete study due to lack of patient cooperation. Visualized segments (anteroseptal and inferolateral myocardium) demonstrated normal systolic function.  4. Global right ventricle was not assessed.The right ventricular size is not assessed. Right vetricular wall thickness was not assessed.  5. Left atrial size was normal.  6. Right atrial size was not well visualized.  7. The mitral valve is normal in structure. No evidence  of mitral valve regurgitation. No evidence of mitral stenosis.  8. The tricuspid valve is not assessed.  9. The tricuspid valve is not assessed. Tricuspid valve regurgitation is not demonstrated. 10. The aortic valve was not assessed. Aortic valve regurgitation is not visualized. No evidence of aortic valve sclerosis or stenosis. 11. The pulmonic valve was not assessed. Pulmonic valve regurgitation is not visualized. 12. The aortic root was not well visualized, the ascending aorta was not well visualized and the aortic arch was not well visualized. 13. TR signal is inadequate for assessing pulmonary artery systolic pressure. 14. The interatrial septum was not assessed. FINDINGS  Left Ventricle: Left ventricular ejection fraction, by visual estimation, is 60 to 65%. The left ventricle has normal function. The left ventricle has no regional wall motion abnormalities. There is no left ventricular hypertrophy. Normal left atrial pressure. Incomplete study due to lack of patient cooperation. Visualized segments (anteroseptal and inferolateral myocardium) demonstrated normal systolic function. Right Ventricle: The right ventricular size is not assessed. Right vetricular wall thickness was not assessed. Global RV systolic function is was not assessed. Left Atrium: Left atrial size was normal in size. Right Atrium: Right atrial size was not well visualized Pericardium: There is no evidence of pericardial effusion. Mitral Valve: The mitral valve is normal in structure. No evidence of mitral valve regurgitation. No evidence of mitral valve stenosis by observation. Tricuspid Valve: The tricuspid valve is not assessed. Tricuspid valve regurgitation is not demonstrated. Aortic Valve: The aortic valve was not assessed. Aortic valve regurgitation is not visualized. The aortic valve is structurally normal, with no evidence of sclerosis or stenosis. Pulmonic Valve: The pulmonic valve was not assessed. Pulmonic valve regurgitation is  not visualized. Pulmonic regurgitation is not visualized. Aorta: The aortic root was not well visualized, the ascending aorta was not well visualized and the aortic arch was not well visualized. Venous: The inferior vena cava was not well visualized. IAS/Shunts: The interatrial septum was not assessed. There is no evidence of a patent foramen ovale.  Chilton Si MD Electronically signed by Chilton Si MD Signature Date/Time: 04/10/2019/11:49:24 AM    Final    VAS [place] LOWER EXTREMITY VENOUS (DVT)  Result Date: 04/10/2019  Lower Venous Study Indications: Stroke.  Comparison Study: no prior Performing Technologist: Blanch Media RVS  Examination Guidelines: A complete evaluation includes B-mode imaging, spectral Doppler, color Doppler, and power Doppler as needed of all accessible portions of each vessel. Bilateral testing is considered an integral part of a complete examination. Limited examinations for reoccurring indications may be performed as noted.  +---------+---------------+---------+-----------+----------+--------------+ RIGHT    CompressibilityPhasicitySpontaneityPropertiesThrombus Aging +---------+---------------+---------+-----------+----------+--------------+ CFV      Full  Yes      Yes                                 +---------+---------------+---------+-----------+----------+--------------+ SFJ      Full                                                        +---------+---------------+---------+-----------+----------+--------------+ FV Prox  Full                                                        +---------+---------------+---------+-----------+----------+--------------+ FV Mid   Full                                                        +---------+---------------+---------+-----------+----------+--------------+ FV DistalFull                                                         +---------+---------------+---------+-----------+----------+--------------+ PFV      Full                                                        +---------+---------------+---------+-----------+----------+--------------+ POP      Full           Yes      Yes                                 +---------+---------------+---------+-----------+----------+--------------+ PTV      Full                                                        +---------+---------------+---------+-----------+----------+--------------+ PERO     Full                                                        +---------+---------------+---------+-----------+----------+--------------+   +---------+---------------+---------+-----------+----------+--------------+ LEFT     CompressibilityPhasicitySpontaneityPropertiesThrombus Aging +---------+---------------+---------+-----------+----------+--------------+ CFV      Full           Yes      Yes                                 +---------+---------------+---------+-----------+----------+--------------+ SFJ  Full                                                        +---------+---------------+---------+-----------+----------+--------------+ FV Prox  Full                                                        +---------+---------------+---------+-----------+----------+--------------+ FV Mid   Full                                                        +---------+---------------+---------+-----------+----------+--------------+ FV DistalFull                                                        +---------+---------------+---------+-----------+----------+--------------+ PFV      Full                                                        +---------+---------------+---------+-----------+----------+--------------+ POP      Full           Yes      Yes                                  +---------+---------------+---------+-----------+----------+--------------+ PTV      Full                                                        +---------+---------------+---------+-----------+----------+--------------+ PERO     Full                                                        +---------+---------------+---------+-----------+----------+--------------+     Summary: Right: There is no evidence of deep vein thrombosis in the lower extremity. No cystic structure found in the popliteal fossa. Left: There is no evidence of deep vein thrombosis in the lower extremity. No cystic structure found in the popliteal fossa.  *See table(s) above for measurements and observations. Electronically signed by Sherald Hesshristopher Clark MD on 04/10/2019 at 4:29:03 PM.    Final     PHYSICAL EXAM  Temp:  [97.6 F (36.4 C)-98.1 F (36.7 C)] 97.6 F (36.4 C) (01/29 1127) Pulse Rate:  [87-99] 99 (01/29 1127) Resp:  [16-20] 17 (01/29 1127) BP: (143-173)/(87-104) 173/100 (01/29 1127) SpO2:  [98 %-100 %] 100 % (01/29  1127)  General - Well nourished, well developed, not in acute distress, on C-collar.  Ophthalmologic - fundi not visualized due to noncooperation.  Cardiovascular - Regular rhythm and rate.  Neuro - awake alert, interactive, still lethargic. Orientated to self, age, place and time. No aphasia and able to follow simple commands. Able to name and repeat. Mild dysarthria. No significant facial droop. Tongue midline. BUE 4/5 and BLE 3/5. Sensation symmetric, FTN slow but no ataxia. Gait not tested.    ASSESSMENT/PLAN Maria Blackwell is a 79 y.o. female with history of HTN, COPD, CVA in 2013, recent C-spine surgery 1/5 presenting with slurred speech and trouble swallowing x 5 days and generalized weakness associated with multiple falls.   Stroke:  Several punctate R MCA territory infarcts in setting of old L occipital infarct, felt to be embolic secondary to unknown source  CT head No  acute abnormality. Small vessel disease.     CTA head & neck no LVO. Advanced Aortic and ICA bifurcation and siphon atherosclerosis. R ICA 30% stenosis. 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  MRI  Several punctate R MCA territory infarcts. Extensive small vessel disease. Old L occipital infarct.  LE Doppler no DVT  2D Echo EF 60-65%. No source of embolus   Loop recorder placed  LDL 58  HgbA1c 5.7  Lovenox 40 mg sq daily for VTE prophylaxis  aspirin 325 mg daily prior to admission, now on aspirin 81 mg daily and clopidogrel 75 mg daily. Continue DAPT x 3 weeks then plavix alone    Therapy recommendations:  SNF  Disposition:  pending  (admitted from SNF) [facility]. Office will call with appointment date and time. Order placed. (has seen Terrace ArabiaYan in the past)  Hx stroke/TIA  2013 - pontine infarct d/t small vessel disease. No residual deficits. On ASA  Hypertension  Home meds:  None listed  Stable . Permissive hypertension (OK if < 220/120) but gradually normalize in 5-7 days . Long-term BP goal normotensive  Hyperlipidemia  Home meds:  No statin  Now on lipitor 20 - will not use high intensity statin given advanced age and low LDL  LDL 58, goal < 70  Continue statin at discharge  Dysphagia . Most likely secondary to stroke . On pureed diet w/ nectar thick liquids at SNF . passed YALE this admission  . SLP cleared for D2 with thin liquids . 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 Terrace ArabiaYan  AKI d/t poor po intake  Hypothyroidism  Depression    Hospital day # 1  Neurology will sign off. Please call with questions. [facility]. Thanks  for the consult.  Marvel PlanJindong Hillard Goodwine, MD PhD Stroke Neurology 04/11/2019 2:31 PM    To contact Stroke Continuity provider, please refer to [email or web address]. After hours, contact General Neurology

## 2019-04-11 NOTE — TOC Transition Note (Signed)
Transition of Care [facility]) - CM/SW Discharge Note   Patient Details  Name: SHARYL PANCHAL MRN: [id number] Date of Birth: 02-25-1941  [facility] of [facility]) CM/SW Contact:  Baldemar Lenis, LCSW Phone Number: 04/11/2019, 4:17 PM   Clinical Narrative:   Nurse to call report to [phone], Room 301    Final next level of care: Skilled Nursing Facility Barriers to Discharge: Barriers Resolved   Patient Goals and CMS Choice        Discharge Placement              Patient chooses bed at: [facility], [place] Patient to be transferred to facility by: [facility] Name of family member notified: Daughter Patient and family notified of of transfer: 04/11/19  Discharge Plan and Services                                     Social Determinants of Health (SDOH) Interventions     Readmission Risk Interventions No flowsheet data found.

## 2019-04-11 NOTE — Progress Notes (Signed)
Patient discharged via [facility] to [facility] [place].  All personal belongings sent with patient.

## 2019-04-11 NOTE — Discharge Summary (Signed)
Physician Discharge Summary  Maria Blackwell BZJ:[id number] DOB: Jul 29, 1940 DOA: 04/09/2019  PCP: Veverly Fells, MD  Admit date: 04/09/2019 Discharge date: 04/11/2019  Admitted From: Skilled nursing facility Discharge disposition: Skilled nursing facility: [facility]   Recommendations for Outpatient Follow-Up:   1. Aspirin 81 mg daily plus Plavix 75 mg daily for 3 weeks then Plavix alone going forward 2. Loop recorder placed on 04/11/2019 3. Would add beta-blocker for blood pressure control first   Discharge Diagnosis:   Principal Problem:   CVA (cerebrovascular accident) (HCC) Active Problems:   Hypertension   Neck pain   Cervical myelopathy (HCC)   Stenosis of cervical spine with myelopathy (HCC)   Acute embolic stroke (HCC)   Unable to ambulate   AKI (acute kidney injury) (HCC)   Hypothyroidism   Depression   CVA (cerebral vascular accident) (HCC)   DNR (do not resuscitate)    Discharge Condition: Improved.  Diet recommendation: Dysphagia 2 with thin liquids, speech to follow  Code status: DNR   History of Present Illness:    Maria Blackwell is a 79 y.o. female with medical history significant of hypertension, COPD, hypothyroidism, depression, status post C-spine surgery came with a chief complaint of slurred speech of unknown, unable to ambulate, general weakness multiple falls and dysphagia.  Patient is 22 days status post cervical fusion with decompression spinal cord./  On January the 5th.  She was discharged at SNF 2 days later.  She was doing okay but lately she had multiple falls, and Friday 5 days ago started to have some slurred speech of unknown and trouble swallowing.  History was done by family member. On physical exam she does not have any focal neurologic deficits.  She was able to walk in the emergency room with support.  Per family member without support she cannot get up.Marland Kitchen  She was seen by neurosurgery who recommended evaluation in the emergency  room  .Blood pressure 136/86, pulse 91, temperature 98.3 F (36.8 C), temperature source Oral, resp. rate 17, SpO2 98 %.   Hospital Course by Problem:   Stroke:  Several punctate R MCA territory infarcts in setting of old L occipital infarct, felt to be embolic secondary to unknown source   -CTA head & neck no LVO. Advanced Aortic and ICA bifurcation and siphon atherosclerosis. R ICA 30% stenosis. 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 -MRI  Several punctate R MCA territory infarcts. Extensive small vessel disease. Old L occipital infarct. -LE Doppler no DVT -2D Echo EF 60-65%. No source of embolus  -Loop recorder placed -LDL 58 -HgbA1c 5.7 Neurology recommendations: Aspirin 81 mg daily and clopidogrel 75 mg daily. Continue DAPT x 3 weeks then plavix alone     Hypertension -Permissive hypertension (OK if < 220/120) but gradually normalize in 5-7 days -Long-term BP goal normotensive  Hyperlipidemia -LDL: 58 -lipitor 20 -per neurology will not use high intensity statin given advanced age and low LDL  Dysphagia -SLP To follow -DYS 2 with thin liquids  S/p cervical fusion for stenosis of the cervical spine with myelopathy -d/c'd to SNF after surgery for rehabilitation -Dr. Conchita Paris notified of admission  Short episodes of V. Tach <7 beats -Replace magnesium as well as potassium Can add beta-blocker once no longer needing permissive hypertension  Medical Consultants:   Neurology Cardiology: EP for loop recorder   Discharge Exam:   Vitals:   04/11/19 0700 04/11/19 1127  BP: (!) 155/87 (!) 173/100  Pulse: 94  99  Resp: 18 17  Temp: 98.1 F (36.7 C) 97.6 F (36.4 C)  SpO2: 100% 100%   Vitals:   04/10/19 2331 04/11/19 0347 04/11/19 0700 04/11/19 1127  BP: (!) 143/97 (!) 144/87 (!) 155/87 (!) 173/100  Pulse: 98 87 94 99  Resp: 17 19 18 17   Temp: 97.8 F (36.6 C) 98.1 F (36.7 C) 98.1 F (36.7 C) 97.6 F (36.4 C)  TempSrc: Oral Oral Axillary Axillary   SpO2: 100% 99% 100% 100%    General exam: Appears calm and comfortable. Wearing collar    The results of significant diagnostics from this hospitalization (including imaging, microbiology, ancillary and laboratory) are listed below for reference.     Procedures and Diagnostic Studies:   CT Angio Head W or Wo Contrast  Result Date: 04/09/2019 CLINICAL DATA:  Speech disturbance. Dysarthria. History of brain aneurysm. EXAM: CT ANGIOGRAPHY HEAD AND NECK TECHNIQUE: Multidetector CT imaging of the head and neck was performed using the standard protocol during bolus administration of intravenous contrast. Multiplanar CT image reconstructions and MIPs were obtained to evaluate the vascular anatomy. Carotid stenosis measurements (when applicable) are obtained utilizing NASCET criteria, using the distal internal carotid diameter as the denominator. CONTRAST:  100mL OMNIPAQUE IOHEXOL 350 MG/ML SOLN COMPARISON:  MRI 11/07/2018 FINDINGS: CT HEAD FINDINGS Brain: Age related volume loss. Chronic small-vessel ischemic changes of the cerebral hemispheric white matter. No sign of acute infarction, mass lesion, hemorrhage, hydrocephalus or extra-axial collection. Vascular: There is atherosclerotic calcification of the major vessels at the base of the brain. Skull: Negative Sinuses: Clear Orbits: Normal Review of the MIP images confirms the above findings CTA NECK FINDINGS Aortic arch: Aortic atherosclerosis. Branching pattern is normal without origin stenosis. Right carotid system: Common carotid artery widely patent to the bifurcation. Calcified plaque at the carotid bifurcation and ICA bulb. Minimal diameter in the ICA bulb is 3.5 mm. Compared to a more distal cervical ICA diameter of 5 mm, this indicates a 30% stenosis. Cervical ICA widely patent beyond that. Left carotid system: Common carotid artery widely patent to the bifurcation. Calcified plaque at carotid bifurcation and ICA bulb. No stenosis. Cervical ICA  widely patent beyond that. Vertebral arteries: Both vertebral artery origins are widely patent. Both vertebral arteries are widely patent through the cervical region to the foramen magnum. Skeleton: Previous ACDF C4 through C7. Other neck: No mass or lymphadenopathy. Upper chest: Emphysema.  No focal or active process otherwise. Review of the MIP images confirms the above findings CTA HEAD FINDINGS Anterior circulation: Both internal carotid arteries are widely patent through the skull base and siphon regions. There is atherosclerotic calcification in both carotid siphon regions but without stenosis. There are small atherosclerotic aneurysms in the siphon regions bilaterally, none larger than 2 mm. The anterior and middle cerebral vessels are patent. No large or medium vessel occlusion. Posterior circulation: Both vertebral arteries are patent through the foramen magnum to the basilar. No basilar stenosis. Posterior circulation branch vessels are patent. Venous sinuses: Patent and normal. Anatomic variants: None significant. Review of the MIP images confirms the above findings IMPRESSION: No intracranial large or medium vessel occlusion. Advanced aortic atherosclerotic disease. Atherosclerotic disease of the carotid bifurcation regions. 30% ICA stenosis on the right. No measurable stenosis on the left. 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 Head CT does not show any acute finding. Chronic small-vessel ischemic changes of the white matter. Electronically Signed   By:  Paulina Fusi M.D.   On: 04/09/2019 18:55   CT Angio Neck W and/or Wo Contrast  Result Date: 04/09/2019 CLINICAL DATA:  Speech disturbance. Dysarthria. History of brain aneurysm. EXAM: CT ANGIOGRAPHY HEAD AND NECK TECHNIQUE: Multidetector CT imaging of the head and neck was performed using the standard protocol during bolus administration of intravenous contrast.  Multiplanar CT image reconstructions and MIPs were obtained to evaluate the vascular anatomy. Carotid stenosis measurements (when applicable) are obtained utilizing NASCET criteria, using the distal internal carotid diameter as the denominator. CONTRAST:  OMNIPAQUE IOHEXOL 350 MG/ML SOLN COMPARISON:  MRI 11/07/2018 FINDINGS: CT HEAD FINDINGS Brain: Age related volume loss. Chronic small-vessel ischemic changes of the cerebral hemispheric white matter. No sign of acute infarction, mass lesion, hemorrhage, hydrocephalus or extra-axial collection. Vascular: There is atherosclerotic calcification of the major vessels at the base of the brain. Skull: Negative Sinuses: Clear Orbits: Normal Review of the MIP images confirms the above findings CTA NECK FINDINGS Aortic arch: Aortic atherosclerosis. Branching pattern is normal without origin stenosis. Right carotid system: Common carotid artery widely patent to the bifurcation. Calcified plaque at the carotid bifurcation and ICA bulb. Minimal diameter in the ICA bulb is 3.5 mm. Compared to a more distal cervical ICA diameter of 5 mm, this indicates a 30% stenosis. Cervical ICA widely patent beyond that. Left carotid system: Common carotid artery widely patent to the bifurcation. Calcified plaque at carotid bifurcation and ICA bulb. No stenosis. Cervical ICA widely patent beyond that. Vertebral arteries: Both vertebral artery origins are widely patent. Both vertebral arteries are widely patent through the cervical region to the foramen magnum. Skeleton: Previous ACDF C4 through C7. Other neck: No mass or lymphadenopathy. Upper chest: Emphysema.  No focal or active process otherwise. Review of the MIP images confirms the above findings CTA HEAD FINDINGS Anterior circulation: Both internal carotid arteries are widely patent through the skull base and siphon regions. There is atherosclerotic calcification in both carotid siphon regions but without stenosis. There are small  atherosclerotic aneurysms in the siphon regions bilaterally, none larger than 2 mm. The anterior and middle cerebral vessels are patent. No large or medium vessel occlusion. Posterior circulation: Both vertebral arteries are patent through the foramen magnum to the basilar. No basilar stenosis. Posterior circulation branch vessels are patent. Venous sinuses: Patent and normal. Anatomic variants: None significant. Review of the MIP images confirms the above findings IMPRESSION: No intracranial large or medium vessel occlusion. Advanced aortic atherosclerotic disease. Atherosclerotic disease of the carotid bifurcation regions. 30% ICA stenosis on the right. No measurable stenosis on the left. 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 Head CT does not show any acute finding. Chronic small-vessel ischemic changes of the white matter. Electronically Signed   By: Paulina Fusi M.D.   On: 04/09/2019 18:55   MR Brain Wo Contrast (neuro protocol)  Result Date: 04/09/2019 CLINICAL DATA:  History of stroke and brain aneurysm. Speech disturbance. EXAM: MRI HEAD WITHOUT CONTRAST TECHNIQUE: Multiplanar, multiecho pulse sequences of the brain and surrounding structures were obtained without intravenous contrast. COMPARISON:  CT angiography same day FINDINGS: Brain: Diffusion imaging shows a punctate acute white matter infarction in the right frontal subcortical white matter. Few small foci of acute infarction affecting right parietal gyral surfaces. Punctate acute infarction of the right parietal deep white matter. This is consistent with micro embolic disease in the right MCA territory. No large or confluent acute infarction.  There are chronic small-vessel ischemic changes of the pons and cerebellum. Old small vessel infarctions of the thalami and throughout the cerebral hemispheric white matter. There is an old left occipital cortical  infarction. No mass, acute hemorrhage, hydrocephalus or extra-axial collection. Vascular: Major vessels at the base of the brain show flow. Skull and upper cervical spine: Negative Sinuses/Orbits: Clear/normal Other: None IMPRESSION: Several punctate acute infarctions scattered in the right MCA territory affecting the right frontal and parietal deep white matter and right parietal gyral surfaces. No swelling or hemorrhage. No large confluent acute infarction. Extensive chronic small-vessel ischemic changes elsewhere throughout the brain. Old left occipital infarction. Electronically Signed   By: Paulina Fusi M.D.   On: 04/09/2019 21:45   DG Chest Portable 1 View  Result Date: 04/09/2019 CLINICAL DATA:  Altered mental status elevated white count EXAM: PORTABLE CHEST 1 VIEW COMPARISON:  07/05/2018 FINDINGS: No focal airspace disease or pleural effusion. Mild cardiomegaly with tortuous aorta. Aortic atherosclerosis. No pneumothorax. IMPRESSION: No active disease. Mild cardiomegaly. Electronically Signed   By: Jasmine Pang M.D.   On: 04/09/2019 15:19   ECHOCARDIOGRAM COMPLETE  Result Date: 04/10/2019   ECHOCARDIOGRAM REPORT   Patient Name:   SAHASRA BELUE Date of Exam: 04/10/2019 Medical Rec #:  [id number]        Height:       62.0 in Accession #:    [id number]       Weight:       138.0 lb Date of Birth:  [birth year]        BSA:          1.63 m Patient Age:    78 years         BP:           127/93 mmHg Patient Gender: F                HR:           90 bpm. Exam Location:  Inpatient Procedure: 2D Echo Indications:    Stroke  History:        Patient has prior history of Echocardiogram examinations, most                 recent 12/04/2018.  Sonographer:    Ross Ludwig RDCS (AE) Referring Phys: [id number] [facility]  Sonographer Comments: Suboptimal parasternal window. Image acquisition challenging due to patient behavioral factors. and Image acquisition challenging due to uncooperative patient. Unable to  complete echo due to patient pushing and hitting my left arm during echo. IMPRESSIONS  1. Left ventricular ejection fraction, by visual estimation, is 60 to 65%. The left ventricle has normal function. There is no left ventricular hypertrophy.  2. The left ventricle has no regional wall motion abnormalities.  3. Incomplete study due to lack of patient cooperation. Visualized segments (anteroseptal and inferolateral myocardium) demonstrated normal systolic function.  4. Global right ventricle was not assessed.The right ventricular size is not assessed. Right vetricular wall thickness was not assessed.  5. Left atrial size was normal.  6. Right atrial size was not well visualized.  7. The mitral valve is normal in structure. No evidence of mitral valve regurgitation. No evidence of mitral stenosis.  8. The tricuspid valve is not assessed.  9. The tricuspid valve is not assessed. Tricuspid valve regurgitation is not demonstrated. 10. The aortic valve was not assessed. Aortic valve regurgitation is not visualized. No evidence of aortic valve sclerosis or stenosis. 11. The pulmonic valve  was not assessed. Pulmonic valve regurgitation is not visualized. 12. The aortic root was not well visualized, the ascending aorta was not well visualized and the aortic arch was not well visualized. 13. TR signal is inadequate for assessing pulmonary artery systolic pressure. 14. The interatrial septum was not assessed. FINDINGS  Left Ventricle: Left ventricular ejection fraction, by visual estimation, is 60 to 65%. The left ventricle has normal function. The left ventricle has no regional wall motion abnormalities. There is no left ventricular hypertrophy. Normal left atrial pressure. Incomplete study due to lack of patient cooperation. Visualized segments (anteroseptal and inferolateral myocardium) demonstrated normal systolic function. Right Ventricle: The right ventricular size is not assessed. Right vetricular wall thickness was  not assessed. Global RV systolic function is was not assessed. Left Atrium: Left atrial size was normal in size. Right Atrium: Right atrial size was not well visualized Pericardium: There is no evidence of pericardial effusion. Mitral Valve: The mitral valve is normal in structure. No evidence of mitral valve regurgitation. No evidence of mitral valve stenosis by observation. Tricuspid Valve: The tricuspid valve is not assessed. Tricuspid valve regurgitation is not demonstrated. Aortic Valve: The aortic valve was not assessed. Aortic valve regurgitation is not visualized. The aortic valve is structurally normal, with no evidence of sclerosis or stenosis. Pulmonic Valve: The pulmonic valve was not assessed. Pulmonic valve regurgitation is not visualized. Pulmonic regurgitation is not visualized. Aorta: The aortic root was not well visualized, the ascending aorta was not well visualized and the aortic arch was not well visualized. Venous: The inferior vena cava was not well visualized. IAS/Shunts: The interatrial septum was not assessed. There is no evidence of a patent foramen ovale.  Skeet Latch MD Electronically signed by Skeet Latch MD Signature Date/Time: 04/10/2019/11:49:24 AM    Final    VAS [place] LOWER EXTREMITY VENOUS (DVT)  Result Date: 04/10/2019  Lower Venous Study Indications: Stroke.  Comparison Study: no prior Performing Technologist: Abram Sander RVS  Examination Guidelines: A complete evaluation includes B-mode imaging, spectral Doppler, color Doppler, and power Doppler as needed of all accessible portions of each vessel. Bilateral testing is considered an integral part of a complete examination. Limited examinations for reoccurring indications may be performed as noted.  +---------+---------------+---------+-----------+----------+--------------+ RIGHT    CompressibilityPhasicitySpontaneityPropertiesThrombus Aging  +---------+---------------+---------+-----------+----------+--------------+ CFV      Full           Yes      Yes                                 +---------+---------------+---------+-----------+----------+--------------+ SFJ      Full                                                        +---------+---------------+---------+-----------+----------+--------------+ FV Prox  Full                                                        +---------+---------------+---------+-----------+----------+--------------+ FV Mid   Full                                                        +---------+---------------+---------+-----------+----------+--------------+  FV DistalFull                                                        +---------+---------------+---------+-----------+----------+--------------+ PFV      Full                                                        +---------+---------------+---------+-----------+----------+--------------+ POP      Full           Yes      Yes                                 +---------+---------------+---------+-----------+----------+--------------+ PTV      Full                                                        +---------+---------------+---------+-----------+----------+--------------+ PERO     Full                                                        +---------+---------------+---------+-----------+----------+--------------+   +---------+---------------+---------+-----------+----------+--------------+ LEFT     CompressibilityPhasicitySpontaneityPropertiesThrombus Aging +---------+---------------+---------+-----------+----------+--------------+ CFV      Full           Yes      Yes                                 +---------+---------------+---------+-----------+----------+--------------+ SFJ      Full                                                         +---------+---------------+---------+-----------+----------+--------------+ FV Prox  Full                                                        +---------+---------------+---------+-----------+----------+--------------+ FV Mid   Full                                                        +---------+---------------+---------+-----------+----------+--------------+ FV DistalFull                                                        +---------+---------------+---------+-----------+----------+--------------+  PFV      Full                                                        +---------+---------------+---------+-----------+----------+--------------+ POP      Full           Yes      Yes                                 +---------+---------------+---------+-----------+----------+--------------+ PTV      Full                                                        +---------+---------------+---------+-----------+----------+--------------+ PERO     Full                                                        +---------+---------------+---------+-----------+----------+--------------+     Summary: Right: There is no evidence of deep vein thrombosis in the lower extremity. No cystic structure found in the popliteal fossa. Left: There is no evidence of deep vein thrombosis in the lower extremity. No cystic structure found in the popliteal fossa.  *See table(s) above for measurements and observations. Electronically signed by Sherald Hesshristopher Clark MD on 04/10/2019 at 4:29:03 PM.    Final      Labs:   Basic Metabolic Panel: Recent Labs  Lab 04/09/19 1256 04/09/19 1256 04/09/19 1305 04/09/19 1305 04/10/19 0307 04/11/19 0213  NA 143  --  143  --  141 140  K 3.6   < > 3.6   < > 3.2* 3.9  CL 106  --  108  --  108 105  CO2 25  --   --   --  25 28  GLUCOSE 114*  --  109*  --  119* 100*  BUN 41*  --  41*  --  34* 21  CREATININE 1.13*  --  1.10*  --  0.97 0.88  CALCIUM 9.8   --   --   --  8.8* 8.6*  MG  --   --   --   --  1.8  --    < > = values in this interval not displayed.   GFR CrCl cannot be calculated (Unknown ideal weight.). Liver Function Tests: Recent Labs  Lab 04/09/19 1256 04/10/19 0307 04/11/19 0213  AST 17 15 20   ALT 11 11 9   ALKPHOS 82 70 72  BILITOT 0.5 0.4 0.8  PROT 6.6 5.6* 5.2*  ALBUMIN 2.8* 2.4* 2.4*   No results for input(s): LIPASE, AMYLASE in the last 168 hours. No results for input(s): AMMONIA in the last 168 hours. Coagulation profile Recent Labs  Lab 04/09/19 1256  INR 1.0    CBC: Recent Labs  Lab 04/09/19 1256 04/09/19 1305 04/10/19 0307 04/11/19 0213  WBC 13.8*  --  9.4 8.8  NEUTROABS 9.8*  --   --   --   HGB 13.8  13.9 11.6* 11.8*  HCT 43.9 41.0 35.5* 36.2  MCV 97.8  --  95.2 95.5  PLT 328  --  275 253   Cardiac Enzymes: No results for input(s): CKTOTAL, CKMB, CKMBINDEX, TROPONINI in the last 168 hours. BNP: Invalid input(s): POCBNP CBG: No results for input(s): GLUCAP in the last 168 hours. D-Dimer No results for input(s): DDIMER in the last 72 hours. Hgb A1c Recent Labs    04/10/19 0307  HGBA1C 5.7*   Lipid Profile Recent Labs    04/10/19 0307  CHOL 99  HDL 21*  LDLCALC 58  TRIG 284  CHOLHDL 4.7   Thyroid function studies No results for input(s): TSH, T4TOTAL, T3FREE, THYROIDAB in the last 72 hours.  Invalid input(s): FREET3 Anemia work up No results for input(s): VITAMINB12, FOLATE, FERRITIN, TIBC, IRON, RETICCTPCT in the last 72 hours. Microbiology Recent Results (from the past 240 hour(s))  Urine culture     Status: None   Collection Time: 04/09/19  5:01 PM   Specimen: Urine, Random  Result Value Ref Range Status   Specimen Description URINE, RANDOM  Final   Special Requests NONE  Final   Culture   Final    NO GROWTH Performed at [facility], [address] [place], [place] [address]    Report Status 04/10/2019 FINAL  Final  SARS CORONAVIRUS 2 (TAT 6-24 HRS)  Nasopharyngeal Nasopharyngeal Swab     Status: None   Collection Time: 04/09/19 10:02 PM   Specimen: Nasopharyngeal Swab  Result Value Ref Range Status   SARS Coronavirus 2 NEGATIVE NEGATIVE Final    Comment: (NOTE) SARS-CoV-2 target nucleic acids are NOT DETECTED. The SARS-CoV-2 RNA is generally detectable in upper and lower respiratory specimens during the acute phase of infection. Negative results do not preclude SARS-CoV-2 infection, do not rule out co-infections with other pathogens, and should not be used as the sole basis for treatment or other patient management decisions. Negative results must be combined with clinical observations, patient history, and epidemiological information. The expected result is Negative. Fact Sheet for Patients: HairSlick.no Fact Sheet for Healthcare Providers: quierodirigir.com This test is not yet approved or cleared by the [place] FDA and  has been authorized for detection and/or diagnosis of SARS-CoV-2 by FDA under an Emergency Use Authorization (EUA). This EUA will remain  in effect (meaning this test can be used) for the duration of the COVID-19 declaration under Section 56 4(b)(1) of the Act, 21 U.S.C. section 360bbb-3(b)(1), unless the authorization is terminated or revoked sooner. Performed at [facility], [address] [place], [place] [address]      Discharge Instructions:   Discharge Instructions    Ambulatory referral to Neurology   Complete by: As directed    An appointment is requested in approximately: 4 weeks     Allergies as of 04/11/2019      Reactions   Amoxicillin Rash      Medication List    STOP taking these medications   aspirin 325 MG tablet Replaced by: aspirin 81 MG EC tablet   HYDROcodone-acetaminophen 5-325 MG tablet Commonly known as: NORCO/VICODIN   potassium chloride 20 MEQ packet Commonly known as: KLOR-CON     TAKE these  medications   aspirin 81 MG EC tablet Take 1 tablet (81 mg total) by mouth daily. Start taking on: April 12, 2019 Replaces: aspirin 325 MG tablet   atorvastatin 20 MG tablet Commonly known as: LIPITOR Take 1 tablet (20 mg total) by  mouth daily at 6 PM.   clobetasol ointment 0.05 % Commonly known as: TEMOVATE Apply 1 application topically 2 (two) times daily. Up to 3 times daily   clopidogrel 75 MG tablet Commonly known as: PLAVIX Take 1 tablet (75 mg total) by mouth daily. Start taking on: April 12, 2019   FLUoxetine 20 MG tablet Commonly known as: PROZAC Take 20 mg by mouth at bedtime.   levothyroxine 112 MCG tablet Commonly known as: SYNTHROID Take 112 mcg by mouth daily before breakfast.   Linzess 145 MCG Caps capsule Generic drug: linaclotide Take 145 mcg by mouth daily as needed (constipation).   magnesium 30 MG tablet Take 1 tablet (30 mg total) by mouth 2 (two) times daily.   OLANZapine 5 MG tablet Commonly known as: ZYPREXA Take 5 mg by mouth at bedtime.   omeprazole 20 MG capsule Commonly known as: PRILOSEC Take 20 mg by mouth daily.   OXYGEN Inhale 2 L into the lungs at bedtime.   Vitamin D (Ergocalciferol) 1.25 MG (50000 UNIT) Caps capsule Commonly known as: DRISDOL Take 50,000 Units by mouth 2 (two) times a week. Tuesday and Friday      Follow-up Information    Levert Feinstein, MD. Schedule an appointment as soon as possible for a visit in 4 week(s).   Specialty: Neurology Contact information: [address] [place] [address] [phone]        [facility] Follow up on 04/24/2019.   Why: at 1000 am for post hospital wound check.  Contact information: [address] [place] [address] [phone]           Time coordinating discharge: 35 min  Signed:  Joseph Art DO  [facility] 04/11/2019, 2:38 PM

## 2019-04-11 NOTE — Interval H&P Note (Signed)
History and Physical Interval Note:  04/11/2019 1:01 PM  Maria Blackwell  has presented today for surgery, with the diagnosis of cryptogenic stroke.  The various methods of treatment have been discussed with the patient and family. After consideration of risks, benefits and other options for treatment, the patient has consented to  Procedure(s): LOOP RECORDER INSERTION (N/A) as a surgical intervention.  The patient's history has been reviewed, patient examined, no change in status, stable for surgery.  I have reviewed the patient's chart and labs.  Questions were answered to the patient's satisfaction.     Hillis Range

## 2019-04-11 NOTE — Evaluation (Signed)
Speech Language Pathology Evaluation Patient Details Name: NAWAAL ALLING MRN: [id number] DOB: 1940-03-15 Today's Date: 04/11/2019 Time: 6237-6283 SLP Time Calculation (min) (ACUTE ONLY): 20 min  Problem List:  Patient Active Problem List   Diagnosis Date Noted  . Hypothyroidism 04/10/2019  . Depression 04/10/2019  . CVA (cerebral vascular accident) ([place]) 04/10/2019  . DNR (do not resuscitate) 04/10/2019  . Acute embolic stroke ([place]) 15/17/6160  . Unable to ambulate 04/09/2019  . AKI (acute kidney injury) ([place]) 04/09/2019  . 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 Cerebellar ataxia in diseases classified elsewhere ([place]) 11/12/2018  . Cervical myelopathy ([place]) 11/12/2018  . Urinary incontinence 10/17/2018  . Gait abnormality 10/17/2018  . Left-sided low back pain with left-sided sciatica 10/17/2018  . Neck pain 10/17/2018  . Constipation 09/09/2018  . Abdominal pain 09/09/2018  . CVA (cerebrovascular accident) ([place]) 01/03/2012  . Headache(784.0) 01/03/2012  . Vision changes 01/03/2012  . Hypertension    Past Medical History:  Past Medical History:  Diagnosis Date  . COPD (chronic obstructive pulmonary disease) ([place])   . Depression with anxiety   . Hypertension   . Hypothyroid   . Stroke [facility]) 2015   Past Surgical History:  Past Surgical History:  Procedure Laterality Date  . ABDOMINAL HYSTERECTOMY    . ANTERIOR CERVICAL DECOMP/DISCECTOMY FUSION N/A 03/18/2019   Procedure: ANTERIOR CERVICAL DECOMPRESSION/DISCECTOMY FUSION, CERVICAL FOUR- CERVICAL FIVE, CERVICAL FIVE- CERVICAL SIX, CERVICAL SIX- CERVICAL SEVEN;  Surgeon: Consuella Lose, MD;  Location: [facility];  Service: Neurosurgery;  Laterality: N/A;  . CHOLECYSTECTOMY    . EYE SURGERY Right   . IR ANGIO INTRA EXTRACRAN SEL COM CAROTID INNOMINATE BILAT MOD SED  09/19/2016  . IR ANGIO VERTEBRAL SEL VERTEBRAL UNI L MOD SED  09/19/2016  . IR US GUIDE VASC ACCESS RIGHT  09/19/2016  . TUMOR REMOVAL      HPI:  79 y.o. female admitted 04/09/19 with generalized weakness, multiple falls, intermittent slurred speech, dysphagia. PMH: HTN, COPD, CVA (2015), C-spine surgery 03/18/19. MRI brain was performed which showed punctate acute infarctions across the right MCA territory.   Assessment / Plan / Recommendation Clinical Impression  The Mini-Mental State Examination (MMSE) was administered. Pt scored 23/30, indicating mild cognitive impairments. Points were lost on orientation (day, date), delayed recall, repetition (possibly due to poor hearing?), and following multi-step directions (likely due to reduced attention and recall). No family present to establish baseline cognitive function, however, she does have a history of previous cva (2015) and new findings on MRI. No further ST intervention recommended at this level of care. Pt may benefit from skilled ST intervention at next level of care.    SLP Assessment  SLP Recommendation/Assessment: All further Speech Language Pathology needs can be addressed in the next venue of care  SLP Visit Diagnosis: Attention and concentration deficit Attention and concentration deficit following: Cerebral infarction    Follow Up Recommendations  Skilled Nursing facility       SLP Evaluation Cognition  Overall Cognitive Status: No family/caregiver present to determine baseline cognitive functioning Arousal/Alertness: Awake/alert Orientation Level: Oriented to person;Oriented to place;Oriented to situation;Disoriented to time Attention: Focused;Sustained Focused Attention: Appears intact Sustained Attention: Appears intact Memory: Impaired Memory Impairment: Storage deficit;Retrieval deficit;Decreased recall of new information;Decreased short term memory Problem Solving: Impaired Problem Solving Impairment: Verbal basic Comments: 23/30 MMSE       Comprehension  Auditory Comprehension Overall Auditory Comprehension: Appears within functional limits for  tasks assessed Interfering Components: Hearing  Expression Expression Primary Mode of Expression: Verbal Verbal Expression Overall Verbal Expression: Appears within functional limits for tasks assessed   Oral / Motor  Oral Motor/Sensory Function Overall Oral Motor/Sensory Function: Generalized oral weakness Motor Speech Overall Motor Speech: Appears within functional limits for tasks assessed   GO                   Lizanne Erker B. Murvin Natal, [facility], CCC-SLP Speech Language Pathologist Office: [phone] Pager: [phone]   Leigh Aurora 04/11/2019, 1:29 PM

## 2019-04-11 NOTE — Progress Notes (Signed)
  Speech Language Pathology Treatment: Dysphagia  Patient Details Name: Maria Blackwell MRN: [id number] DOB: Feb 14, 1941 Today's Date: 04/11/2019 Time: 3007-6226 SLP Time Calculation (min) (ACUTE ONLY): 15 min  Assessment / Plan / Recommendation Clinical Impression  Pt seen at bedside for follow up after BSE completed 04/10/19. Pt much more alert today. She reports being on thickened liquids only since being at [facility], and indicated being on regular solids at home. Pt accepted trials of thin liquid and graham crackers, and did not exhibit overt s/s aspiration despite multiple presentations. Will advance diet to dys 2 with thin liquids. Safe swallow precautions posted at [facility], RN and MD informed of recommendations. SLP will continue to follow to assess diet tolerance and readiness to advance diet further.   HPI HPI: 79 y.o. female admitted 04/09/19 with generalized weakness, multiple falls, intermittent slurred speech, dysphagia. PMH: HTN, COPD, CVA (2015), C-spine surgery 03/18/19. MRI brain was performed which showed punctate acute infarctions across the right MCA territory.      SLP Plan  Continue with current plan of care       Recommendations  Diet recommendations: Dysphagia 2 (fine chop);Thin liquid Liquids provided via: Straw Medication Administration: Whole meds with puree Supervision: Patient able to self feed;Intermittent supervision to cue for compensatory strategies Compensations: Slow rate;Small sips/bites;Minimize environmental distractions Postural Changes and/or Swallow Maneuvers: Seated upright 90 degrees;Upright 30-60 min after meal                Oral Care Recommendations: Oral care BID Follow up Recommendations: Skilled Nursing facility SLP Visit Diagnosis: Dysphagia, unspecified (R13.10) Plan: Continue with current plan of care       GO              Nam Vossler B. Murvin Natal, [facility], CCC-SLP Speech Language Pathologist Office: [phone] Pager: [phone]   Leigh Aurora 04/11/2019, 12:54 PM

## 2019-04-11 NOTE — Discharge Instructions (Signed)

## 2019-04-16 ENCOUNTER — Non-Acute Institutional Stay: Payer: Medicare Other | Admitting: Hospice

## 2019-04-16 DIAGNOSIS — I63011 Cerebral infarction due to thrombosis of right vertebral artery: Secondary | ICD-10-CM

## 2019-04-16 DIAGNOSIS — Z515 Encounter for palliative care: Secondary | ICD-10-CM

## 2019-04-16 NOTE — Progress Notes (Signed)
Therapist, nutritional Palliative Care Consult Note Telephone: [phone]  Fax: [phone]  PATIENT NAME: Maria Blackwell DOB: May 22, 1940 MRN: [id number]  PRIMARY CARE PROVIDER:   Veverly Fells, MD  REFERRING PROVIDER:  Dr. Elvera Lennox gates RESPONSIBLE PARTY: Daughter - Eusebio Friendly [phone]     RECOMMENDATIONS/PLAN:   Advance Care Planning/Goals of Care:  Visit consisted of building trust and discussions on Palliative Medicine as specialized medical care for people living with serious illness, aimed at facilitating better quality of life through symptoms relief, assisting with advance care plan and establishing goals of care. Patient is a DNR. DNR in facility chart, uploaded to Epic today. Maria Blackwell affirmed her code status and welcomed Palliative services.  Goals of care include to maximize quality of life and symptom management. Symptom management: Pain related to neck surgery C6-C7 cervical fusion with decompression of spinal cord well managed with Hydrocodone PRN severe pain. Patient denied pain, was seen walking up to 35 feet with PT, using her rolling walker, in no distress, FLACC 0. Following her recent CVA, patient presents with dysphagia 2. Diet now downgraded to pureed diet, thin consistency. She is also on Magic cup, med pass for added caloric nutrition. Patient with loop recorder to monitor her heart. Per Maria Blackwell, the Cardiologist will decide from her recordings what other steps to take. Patient was off her hard cervical collar today.  Follow up: Palliative care will continue to follow patient for goals of care clarification and symptom management. I spent 60  minutes providing this consultation.  More than 50% of the time in this consultation was spent on coordinating communication  HISTORY OF PRESENT ILLNESS:  Maria Blackwell is a 79 y.o. year old female with multiple medical problems including CVA, cervical myelopathy with recent neck surgery Palliative Care was  asked to help address goals of care.   CODE STATUS: DNR  PPS: 50% HOSPICE ELIGIBILITY/DIAGNOSIS: TBD  PAST MEDICAL HISTORY:  Past Medical History:  Diagnosis Date  . COPD (chronic obstructive pulmonary disease) ([facility])   . Depression with anxiety   . Hypertension   . Hypothyroid   . Stroke [facility]) 2015    SOCIAL HX:  Social History   Tobacco Use  . Smoking status: Current Every Day Smoker    Packs/day: 1.00    Types: Cigarettes  . Smokeless tobacco: Never Used  Substance Use Topics  . Alcohol use: No    ALLERGIES:  Allergies  Allergen Reactions  . Amoxicillin Rash     PERTINENT MEDICATIONS:  Outpatient Encounter Medications as of 04/16/2019  Medication Sig  . aspirin EC 81 MG EC tablet Take 1 tablet (81 mg total) by mouth daily.  Marland Kitchen atorvastatin (LIPITOR) 20 MG tablet Take 1 tablet (20 mg total) by mouth daily at 6 PM.  . clobetasol ointment (TEMOVATE) 0.05 % Apply 1 application topically 2 (two) times daily. Up to 3 times daily  . clopidogrel (PLAVIX) 75 MG tablet Take 1 tablet (75 mg total) by mouth daily.  Marland Kitchen FLUoxetine (PROZAC) 20 MG tablet Take 20 mg by mouth at bedtime.  Marland Kitchen levothyroxine (SYNTHROID) 112 MCG tablet Take 112 mcg by mouth daily before breakfast.   . linaclotide (LINZESS) 145 MCG CAPS capsule Take 145 mcg by mouth daily as needed (constipation).  . magnesium 30 MG tablet Take 1 tablet (30 mg total) by mouth 2 (two) times daily.  Marland Kitchen OLANZapine (ZYPREXA) 5 MG tablet Take 5 mg by mouth at bedtime.  Marland Kitchen omeprazole (PRILOSEC)  20 MG capsule Take 20 mg by mouth daily.   . OXYGEN Inhale 2 L into the lungs at bedtime.  . Vitamin D, Ergocalciferol, (DRISDOL) 1.25 MG (50000 UT) CAPS capsule Take 50,000 Units by mouth 2 (two) times a week. Tuesday and Friday   No facility-administered encounter medications on file as of 04/16/2019.    PHYSICAL EXAM:   General: NAD, frail appearing, thin Cardiovascular: regular rate and rhythm Pulmonary: clear ant/post fields; normal  respiratory effort, no SOB, no adventitious lung sounds Abdomen: soft, nontender, + bowel sounds GU: no suprapubic tenderness Extremities: no edema, no joint deformities Skin: no rashes to exposed skin; incision site left lateral neck healed, clean dry intact Neurological: alert and oriented x 3. Weakness but otherwise nonfocal  Teodoro Spray, NP

## 2019-04-18 ENCOUNTER — Other Ambulatory Visit: Payer: Self-pay

## 2019-04-24 ENCOUNTER — Ambulatory Visit (INDEPENDENT_AMBULATORY_CARE_PROVIDER_SITE_OTHER): Payer: Medicare Other | Admitting: *Deleted

## 2019-04-24 ENCOUNTER — Other Ambulatory Visit: Payer: Self-pay

## 2019-04-24 DIAGNOSIS — I6340 Cerebral infarction due to embolism of unspecified cerebral artery: Secondary | ICD-10-CM

## 2019-04-24 LAB — CUP PACEART INCLINIC DEVICE CHECK
Date Time Interrogation Session: 20210211100415
Implantable Pulse Generator Implant Date: 20210129

## 2019-04-24 NOTE — Progress Notes (Signed)
ILR wound check in clinic. Steri strips removed. Wound well healed. Home monitor transmitting nightly. No episodes. Questions answered. R waves 0.26 mV

## 2019-05-16 ENCOUNTER — Ambulatory Visit (INDEPENDENT_AMBULATORY_CARE_PROVIDER_SITE_OTHER): Payer: Medicare Other | Admitting: *Deleted

## 2019-05-16 DIAGNOSIS — I63011 Cerebral infarction due to thrombosis of right vertebral artery: Secondary | ICD-10-CM

## 2019-05-16 LAB — CUP PACEART REMOTE DEVICE CHECK
Date Time Interrogation Session: 20210304210314
Implantable Pulse Generator Implant Date: 20210129

## 2019-05-16 NOTE — Progress Notes (Signed)
ILR Remote 

## 2019-05-27 ENCOUNTER — Other Ambulatory Visit: Payer: Self-pay

## 2019-05-27 ENCOUNTER — Ambulatory Visit (INDEPENDENT_AMBULATORY_CARE_PROVIDER_SITE_OTHER): Payer: Medicare Other | Admitting: Neurology

## 2019-05-27 VITALS — BP 150/101 | HR 92 | Temp 96.8°F | Ht 62.0 in | Wt 135.5 lb

## 2019-05-27 DIAGNOSIS — G959 Disease of spinal cord, unspecified: Secondary | ICD-10-CM | POA: Diagnosis not present

## 2019-05-27 DIAGNOSIS — R269 Unspecified abnormalities of gait and mobility: Secondary | ICD-10-CM

## 2019-05-27 DIAGNOSIS — I639 Cerebral infarction, unspecified: Secondary | ICD-10-CM

## 2019-05-27 DIAGNOSIS — I6350 Cerebral infarction due to unspecified occlusion or stenosis of unspecified cerebral artery: Secondary | ICD-10-CM | POA: Diagnosis not present

## 2019-05-27 MED ORDER — METOPROLOL SUCCINATE ER 25 MG PO TB24: 25.0000 mg | ORAL_TABLET | Freq: Every morning | ORAL | 11 refills | Status: AC

## 2019-05-27 NOTE — Progress Notes (Signed)
PATIENT: Maria Blackwell DOB: [birth year]  Chief Complaint  Patient presents with  . Follow-up    New room with daughter.  Last seen 11/12/2018. Maria Blackwell (daughter/POA) is with her today. In wheelchair in office today. Pt lives in facility. Able to stand to obtain weight.   . Procedure    Had cervical decompression 03/18/19. She went to rehab afterwards. She had two strokes after surgery. Has slurred speech intermittently. Left leg drags a little .When she sits up or sits back, she gets very dizzy like she is going to pass out.      HISTORICAL  Maria Blackwell is a 79 year old female, seen in request by her primary care PA [place], Darci Current for evaluation of gait difficulty, initial evaluation was on October 17, 2018.  She is accompanied by her daughter Maria Blackwell at today's visit  I have reviewed and summarized the referring note from the referring physician.  She has past medical history of hypertension, hypothyroidism, on supplement, she was at her baseline at the beginning of 2020, had gradual onset but rapid decline of gait abnormality, she fell frequently, both leg give out underneath her, both arms were shaky, ambulate use of a walker, about the same time, she began to have worsening urinary urgency, incontinence, she does complains of low back pain, radiating pain to left hip, there was no significant bilateral lower extremity paresthesia, she has mild neck pain  UPDATE Sept 1 2020: She is with her daughter at today's clinical visit, she continue complains of slow decline gait abnormality, urinary urgency, incontinence, neck pain, radiating to her spine, she is very sedentary, smoke at least a pack a day  We personally reviewed MRI lumbar spine, mild degenerative changes, 4.9 cm abdominal aortic aneurysm,  MRI cervical spine, 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 Conchita Paris on March 18, 2019, placement of intervertebral biomechanical device, anterior instrumentation  She did very well, was discharged to rehabilitation, readmitted to the hospital on April 10, 2019 for slurred speech, difficulty talking, worsening gait abnormality, multiple falls, and dysphagia, evaluation showed punctuated right MCA territory stroke.  I personally reviewed MRI on April 09, 2019: Several punctuated acute infarction scattered in the right MCA territory, involving right frontal, parietal deep white matter, and gyri surface, extensive supratentorium small vessel disease, old left occipital infarction  CT angiogram of head and neck: Right internal carotid artery 30% stenosis, no measurable stenosis on the left side, intracranial atherosclerotic disease,  She is still at nursing home, with rehabilitation, she has made significant progress, she can ambulate with some assistance, but over the past few weeks, she complains of dizziness, especially when she get up using bathroom in the middle of the night, all after prolonged sitting  Today's examination demonstrates orthostatic tachycardia, sitting down blood pressure 142/101, heart rate of 90, standing up 142/90, heart rate of 100; standing up for 1 minutes 135/97, heart rate of 105, she does complains of dizziness,  Laboratory evaluations in January 2021, decreased calcium 8.6, Albumin 2.4, anemia hemoglobin of  11.8,  REVIEW OF SYSTEMS: Full 14 system review of systems performed and notable only for as above All other review of systems were negative.  ALLERGIES: Allergies    Allergen Reactions  . Amoxicillin Rash    HOME MEDICATIONS: Current Outpatient Medications  Medication Sig Dispense Refill  . aspirin EC 81 MG EC tablet Take 1 tablet (81 mg total) by mouth daily.    Marland Kitchen atorvastatin (LIPITOR) 20 MG tablet Take 1 tablet (20 mg total) by mouth daily at 6 PM.    . clopidogrel (PLAVIX) 75 MG tablet Take 1 tablet (75 mg total) by mouth daily.    Marland Kitchen FLUoxetine (PROZAC) 20 MG tablet Take 20 mg by mouth at bedtime.    Marland Kitchen HYDROcodone-acetaminophen (NORCO/VICODIN) 5-325 MG tablet Take 1 tablet by mouth every 6 (six) hours as needed for moderate pain.    Marland Kitchen levothyroxine (SYNTHROID) 112 MCG tablet Take 112 mcg by mouth daily before breakfast.     . linaclotide (LINZESS) 145 MCG CAPS capsule Take 145 mcg by mouth daily as needed (constipation).    . magnesium 30 MG tablet Take 1 tablet (30 mg total) by mouth 2 (two) times daily.    Marland Kitchen OLANZapine (ZYPREXA) 5 MG tablet Take 5 mg by mouth at bedtime.    Marland Kitchen omeprazole (PRILOSEC) 20 MG capsule Take 20 mg by mouth daily.     . OXYGEN Inhale 2 L into the lungs at bedtime.    . triamterene-hydrochlorothiazide (DYAZIDE) 37.5-25 MG capsule Take 1 capsule by mouth daily.    . Vitamin D, Ergocalciferol, (DRISDOL) 1.25 MG (50000 UT) CAPS capsule Take 50,000 Units by mouth 2 (two) times a week. Tuesday and Friday     No current facility-administered medications for this visit.    PAST MEDICAL HISTORY: Past Medical History:  Diagnosis Date  . COPD (chronic obstructive pulmonary disease) ([place])   . Depression with anxiety   . Hypertension   . Hypothyroid   . Stroke [facility]) 2015    PAST SURGICAL HISTORY: Past Surgical History:  Procedure Laterality Date  . ABDOMINAL HYSTERECTOMY    . ANTERIOR CERVICAL DECOMP/DISCECTOMY FUSION N/A 03/18/2019   Procedure: ANTERIOR CERVICAL DECOMPRESSION/DISCECTOMY FUSION, CERVICAL FOUR- CERVICAL FIVE, CERVICAL FIVE- CERVICAL SIX, CERVICAL SIX- CERVICAL SEVEN;  Surgeon: Consuella Lose, MD;   Location: [facility];  Service: Neurosurgery;  Laterality: N/A;  . CHOLECYSTECTOMY    . EYE SURGERY Right   . IR ANGIO INTRA EXTRACRAN SEL COM CAROTID INNOMINATE BILAT MOD SED  09/19/2016  . IR ANGIO VERTEBRAL SEL VERTEBRAL UNI L MOD SED  09/19/2016  . IR US GUIDE VASC ACCESS RIGHT  09/19/2016  . LOOP RECORDER INSERTION N/A 04/11/2019   Procedure: LOOP RECORDER INSERTION;  Surgeon: Thompson Grayer, MD;  Location: [facility];  Service: Cardiovascular;  Laterality: N/A;  . TUMOR REMOVAL      FAMILY HISTORY: Family History  Problem Relation Age of Onset  . Cancer Mother   . Cancer Father   . Colon cancer Neg Hx     SOCIAL HISTORY: Social History   Socioeconomic History  . Marital status: Divorced    Spouse name: Not on file  . Number of children: Not on file  . Years of education: Not on file  . Highest education level: Not on file  Occupational History  . Not on file  Tobacco Use  . Smoking status: Current Every Day Smoker    Packs/day: 1.00    Types: Cigarettes  . Smokeless tobacco: Never Used  Substance and Sexual Activity  . Alcohol use: No  . Drug use: No  . Sexual activity: Not on file  Other Topics Concern  . Not on file  Social History Narrative   Patient lives at home alone. Is a widow  has 3 children and a high school education.   Patient smokes 1 pack of cigarettes and drinks 1-2 cups of coffee.    Social Determinants of Health   Financial Resource Strain:   . Difficulty of Paying Living Expenses:   Food Insecurity:   . Worried About Programme researcher, broadcasting/film/video in the Last Year:   . Barista in the Last Year:   Transportation Needs:   . Freight forwarder (Medical):   Marland Kitchen Lack of Transportation (Non-Medical):   Physical Activity:   . Days of Exercise per Week:   . Minutes of Exercise per Session:   Stress:   . Feeling of Stress :   Social Connections:   . Frequency of Communication with Friends and Family:   . Frequency of Social Gatherings with  Friends and Family:   . Attends Religious Services:   . Active Member of Clubs or Organizations:   . Attends Banker Meetings:   Marland Kitchen Marital Status:   Intimate Partner Violence:   . Fear of Current or Ex-Partner:   . Emotionally Abused:   Marland Kitchen Physically Abused:   . Sexually Abused:      PHYSICAL EXAM   Vitals:   05/27/19 1520  BP: (!) 150/101  Pulse: 92  Temp: (!) 96.8 F (36 C)  SpO2: 96%  Weight: 135 lb 8 oz (61.5 kg)  Height: 5\' 2"  (1.575 m)    Not recorded    Blood pressure sitting down 142/101, heart rate of 90; standing 142/90, heart rate of 100; for 1 minutes 135/97 heart rate of 105, she complains of dizziness,  Body mass index is 24.78 kg/m.  PHYSICAL EXAMNIATION:  Gen: NAD, conversant, well nourised, obese, well groomed                     Cardiovascular: Regular rate rhythm, no peripheral edema, warm, nontender. Eyes: Conjunctivae clear without exudates or hemorrhage Neck: Supple, no carotid bruits. Pulmonary: Clear to auscultation bilaterally   NEUROLOGICAL EXAM:  MENTAL STATUS: Speech:    Speech is normal; fluent and spontaneous with normal comprehension.  Cognition:     Orientation to time, place and person     Normal recent and remote memory     Normal Attention span and concentration     Normal Language, naming, repeating,spontaneous speech     Fund of knowledge   CRANIAL NERVES: CN II: Visual fields are full to confrontation.  Pupils are round equal and briskly reactive to light. CN III, IV, VI: extraocular movement are normal. No ptosis. CN V: Facial sensation is intact to pinprick in all 3 divisions bilaterally. Corneal responses are intact.  CN VII: Face is symmetric with normal eye closure and smile. CN VIII: Hearing is normal to rubbing fingers CN IX, X: Palate elevates symmetrically. Phonation is normal. CN XI: Head turning and shoulder shrug are intact CN XII: Tongue is midline with normal movements and no  atrophy.  MOTOR: Left arm pronation drift, fixation on rapid rotating movement, mild left handgrip weakness, mild bilateral toe extension weakness  REFLEXES: Hyperreflexia on the left upper, and lower extremities, left side Babinski signs,  SENSORY: Length dependent decreased to light touch, pinprick, vibratory sensation  to ankle level  COORDINATION: She has truncal ataxia, no significant bleeding dysmetria noticed.  GAIT/STANCE: She needs push-up to get up from seated position, wide-based, unsteady,   DIAGNOSTIC DATA (LABS, IMAGING, TESTING) - I reviewed patient records, labs, notes, testing and imaging myself where available.   ASSESSMENT AND PLAN  PHELAN GOERS is a 79 y.o. 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 Ph.D.  [facility] [address], [address] [place], [place] Ph: [phone] Fax: [phone]  CC: [phone], Alyson Ingles

## 2019-05-28 ENCOUNTER — Telehealth: Payer: Self-pay | Admitting: Neurology

## 2019-05-28 LAB — CBC WITH DIFFERENTIAL/PLATELET
Basophils Absolute: 0.1 10*3/uL (ref 0.0–0.2)
Basos: 2 %
EOS (ABSOLUTE): 0.3 10*3/uL (ref 0.0–0.4)
Eos: 3 %
Hematocrit: 31.9 % — ABNORMAL LOW (ref 34.0–46.6)
Hemoglobin: 10.5 g/dL — ABNORMAL LOW (ref 11.1–15.9)
Immature Grans (Abs): 0 10*3/uL (ref 0.0–0.1)
Immature Granulocytes: 0 %
Lymphocytes Absolute: 2.4 10*3/uL (ref 0.7–3.1)
Lymphs: 27 %
MCH: 31 pg (ref 26.6–33.0)
MCHC: 32.9 g/dL (ref 31.5–35.7)
MCV: 94 fL (ref 79–97)
Monocytes Absolute: 1.1 10*3/uL — ABNORMAL HIGH (ref 0.1–0.9)
Monocytes: 12 %
Neutrophils Absolute: 5 10*3/uL (ref 1.4–7.0)
Neutrophils: 56 %
Platelets: 320 10*3/uL (ref 150–450)
RBC: 3.39 x10E6/uL — ABNORMAL LOW (ref 3.77–5.28)
RDW: 13.1 % (ref 11.7–15.4)
WBC: 8.9 10*3/uL (ref 3.4–10.8)

## 2019-05-28 LAB — FERRITIN: Ferritin: 65 ng/mL (ref 15–150)

## 2019-05-28 LAB — IRON AND TIBC
Iron Saturation: 8 % — CL (ref 15–55)
Iron: 21 ug/dL — ABNORMAL LOW (ref 27–139)
Total Iron Binding Capacity: 252 ug/dL (ref 250–450)
UIBC: 231 ug/dL (ref 118–369)

## 2019-05-28 LAB — VITAMIN B12: Vitamin B-12: 381 pg/mL (ref 232–1245)

## 2019-05-28 LAB — TSH: TSH: 3.69 u[IU]/mL (ref 0.450–4.500)

## 2019-05-28 NOTE — Telephone Encounter (Signed)
I called and spoke to her daughter who stated her mother is in [facility] in [place] right now. I called [facility] [phone]) and spoke to [facility]. She took the lab values verbally. They will also be followed by a fax to her attention at [phone].  The resident PCP taking care of her is Dr. Marden Noble at [facility]. I will also fax the results over to his attention at [phone].

## 2019-05-28 NOTE — Telephone Encounter (Signed)
Call was returned. Please call back when available.  

## 2019-05-28 NOTE — Telephone Encounter (Signed)
Please call patient, laboratory evaluation showed decreased hemoglobin of 10.5, which is lower than January 2021 it was 11.8, further decrease of hematocrit 31.9, low iron saturation, and iron level  Vitamin B 12, thyroid, function was normal  She would benefit working up with her primary care doctor to rule out bleeding source, and iron supplement.  I have faxed the laboratory report to her primary care doctor Dr. Lindajo Royal

## 2019-05-28 NOTE — Telephone Encounter (Signed)
I have left a message on her daughter's voicemail requesting a call back Eusebio Friendly - ok per DPR).

## 2019-06-16 ENCOUNTER — Ambulatory Visit (INDEPENDENT_AMBULATORY_CARE_PROVIDER_SITE_OTHER): Payer: Medicare Other | Admitting: *Deleted

## 2019-06-16 DIAGNOSIS — I6350 Cerebral infarction due to unspecified occlusion or stenosis of unspecified cerebral artery: Secondary | ICD-10-CM

## 2019-06-16 LAB — CUP PACEART REMOTE DEVICE CHECK
Date Time Interrogation Session: 20210404210038
Implantable Pulse Generator Implant Date: 20210129

## 2019-06-17 ENCOUNTER — Other Ambulatory Visit: Payer: Self-pay

## 2019-06-17 ENCOUNTER — Non-Acute Institutional Stay: Payer: Medicare Other | Admitting: Hospice

## 2019-06-17 DIAGNOSIS — Z515 Encounter for palliative care: Secondary | ICD-10-CM

## 2019-06-17 DIAGNOSIS — I63011 Cerebral infarction due to thrombosis of right vertebral artery: Secondary | ICD-10-CM

## 2019-06-17 NOTE — Progress Notes (Signed)
[facility] Consult Note Telephone: [phone]  Fax: [phone]  PATIENT NAME: Maria Blackwell DOB: 03/24/1940 MRN: [id number]  PRIMARY CARE PROVIDER:   Josetta Huddle, MD  REFERRING PROVIDER:  Josetta Huddle, MD [address] [place],  [place] [address]  RESPONSIBLE PARTY:     REFERRING PROVIDER:  Dr. Alfonso Patten Blackwell RESPONSIBLE PARTY: Daughter - Maria Blackwell [phone]     RECOMMENDATIONS/PLAN:   Advance Care Planning/Goals of Care:  Visit consisted of building trust and follow up on palliative care. Patient is a DNR. DNR in facility chart.  Goals of care include to maximize quality of life and symptom management. Called Maria Blackwell and left her a voicemail with call back number. Symptom management: Patient denied pain in her neck or anywhere in her body. She had recent neck surgery C6-C7 cervical fusion with decompression of spinal cord. No more on neck collar. She is currently on physical therapy; per nursing staff she is stand by to little assist in ADLs. She is in no distress, FLACC 0. Following her CVA, patient presented with dysphagia 2 - Diet was  downgraded to pureed diet, thin consistency. Per ST,  diet is now upgraded to  chopped and thin liquids. She is also on Magic cup, med pass for added caloric nutrition. Patient with loop recorder to monitor her heart. Nursing with no concerns; patient doing well and compliant with her medications.  Follow up: Palliative care will continue to follow patient for goals of care clarification and symptom management. I spent 60 minutes providing this consultation; time includes chart review and documentation.  More than 50% of the time in this consultation was spent on coordinating communication  HISTORY OF PRESENT ILLNESS:  Maria Blackwell is a 79 y.o. year old female with multiple medical problems including CVA, cervical myelopathy with recent neck surgery Palliative Care was asked to help  address goals of care.    CODE STATUS: DNR  PPS: 60% HOSPICE ELIGIBILITY/DIAGNOSIS: TBD  PAST MEDICAL HISTORY:  Past Medical History:  Diagnosis Date  . COPD (chronic obstructive pulmonary disease) ([place])   . Depression with anxiety   . Hypertension   . Hypothyroid   . Stroke [facility]) 2015    SOCIAL HX:  Social History   Tobacco Use  . Smoking status: Current Every Day Smoker    Packs/day: 1.00    Types: Cigarettes  . Smokeless tobacco: Never Used  Substance Use Topics  . Alcohol use: No    ALLERGIES:  Allergies  Allergen Reactions  . Amoxicillin Rash     PERTINENT MEDICATIONS:  Outpatient Encounter Medications as of 06/17/2019  Medication Sig  . atorvastatin (LIPITOR) 20 MG tablet Take 1 tablet (20 mg total) by mouth daily at 6 PM.  . clopidogrel (PLAVIX) 75 MG tablet Take 1 tablet (75 mg total) by mouth daily.  Marland Kitchen FLUoxetine (PROZAC) 20 MG tablet Take 20 mg by mouth at bedtime.  Marland Kitchen HYDROcodone-acetaminophen (NORCO/VICODIN) 5-325 MG tablet Take 1 tablet by mouth every 6 (six) hours as needed for moderate pain.  Marland Kitchen levothyroxine (SYNTHROID) 112 MCG tablet Take 112 mcg by mouth daily before breakfast.   . linaclotide (LINZESS) 145 MCG CAPS capsule Take 145 mcg by mouth daily as needed (constipation).  . magnesium 30 MG tablet Take 1 tablet (30 mg total) by mouth 2 (two) times daily.  . metoprolol succinate (TOPROL XL) 25 MG 24 hr tablet Take 1 tablet (25 mg total) by mouth every  morning.  Marland Kitchen OLANZapine (ZYPREXA) 5 MG tablet Take 5 mg by mouth at bedtime.  Marland Kitchen omeprazole (PRILOSEC) 20 MG capsule Take 20 mg by mouth daily.   . OXYGEN Inhale 2 L into the lungs at bedtime.  . triamterene-hydrochlorothiazide (DYAZIDE) 37.5-25 MG capsule Take 1 capsule by mouth daily.  . Vitamin D, Ergocalciferol, (DRISDOL) 1.25 MG (50000 UT) CAPS capsule Take 50,000 Units by mouth 2 (two) times a week. Tuesday and Friday   No facility-administered encounter medications on file as of 06/17/2019.     PHYSICAL EXAM/ROS:   General: NAD, frail appearing, cooperative Cardiovascular: regular rate and rhythm; denies chest pain/discomfort Pulmonary: clear ant/post fields Abdomen: soft, nontender, + bowel sounds GU: no suprapubic tenderness Extremities: no edema, no joint deformities Skin: no rashes to exposed skin Neurological: Weakness but otherwise nonfocal  Maria Carpenter, NP

## 2019-06-17 NOTE — Progress Notes (Signed)
ILR Remote 

## 2019-07-19 LAB — CUP PACEART REMOTE DEVICE CHECK
Date Time Interrogation Session: 20210505205830
Implantable Pulse Generator Implant Date: 20210129

## 2019-07-21 ENCOUNTER — Ambulatory Visit (INDEPENDENT_AMBULATORY_CARE_PROVIDER_SITE_OTHER): Payer: Medicare Other | Admitting: *Deleted

## 2019-07-21 DIAGNOSIS — I63011 Cerebral infarction due to thrombosis of right vertebral artery: Secondary | ICD-10-CM | POA: Diagnosis not present

## 2019-07-22 NOTE — Progress Notes (Signed)
[facility] Summary Report / Loop Recorder 

## 2019-08-18 ENCOUNTER — Ambulatory Visit (INDEPENDENT_AMBULATORY_CARE_PROVIDER_SITE_OTHER): Payer: Medicare Other | Admitting: Neurology

## 2019-08-18 ENCOUNTER — Other Ambulatory Visit: Payer: Self-pay

## 2019-08-18 ENCOUNTER — Encounter: Payer: Self-pay | Admitting: Neurology

## 2019-08-18 VITALS — BP 133/98 | HR 62

## 2019-08-18 DIAGNOSIS — G40209 Localization-related (focal) (partial) symptomatic epilepsy and epileptic syndromes with complex partial seizures, not intractable, without status epilepticus: Secondary | ICD-10-CM | POA: Insufficient documentation

## 2019-08-18 DIAGNOSIS — I6310 Cerebral infarction due to embolism of unspecified precerebral artery: Secondary | ICD-10-CM | POA: Insufficient documentation

## 2019-08-18 DIAGNOSIS — G959 Disease of spinal cord, unspecified: Secondary | ICD-10-CM | POA: Diagnosis not present

## 2019-08-18 DIAGNOSIS — R269 Unspecified abnormalities of gait and mobility: Secondary | ICD-10-CM | POA: Diagnosis not present

## 2019-08-18 MED ORDER — DIVALPROEX SODIUM 500 MG PO DR TAB: 500.0000 mg | DELAYED_RELEASE_TABLET | Freq: Two times a day (BID) | ORAL | 11 refills | Status: DC

## 2019-08-18 NOTE — Progress Notes (Signed)
PATIENT: Maria Blackwell DOB: 1940-04-25  Chief Complaint  Patient presents with  . Stroke/Cervical Myelopathy    She is here with her daugther, Maria Blackwell. The patient resides at [facility] in [place]. No new concerns today. Overall, she feels better than her last visit. She has continued taking Plavix 75mg  daily.      HISTORICAL  Maria Blackwell is a 79 year old female, seen in request by her primary care PA [place], [place] for evaluation of gait difficulty, initial evaluation was on Maria 6, Maria Blackwell.  She is accompanied by her daughter Maria Blackwell, Maria Blackwell at today's visit  I have reviewed and summarized the referring note from the referring physician.  She has past medical history of hypertension, hypothyroidism, on supplement, she was at her baseline at the beginning of Maria Blackwell, had gradual onset but rapid decline of gait abnormality, she fell frequently, both leg give out underneath her, both arms were shaky, ambulate use of a walker, about the same time, she began to have worsening urinary urgency, incontinence, she does complains of low back pain, radiating pain to left hip, there was no significant bilateral lower extremity paresthesia, she has mild neck pain  UPDATE Sept 1 Maria Blackwell: She is with her daughter at today's clinical visit, she continue complains of slow decline gait abnormality, urinary urgency, incontinence, neck pain, radiating to her spine, she is very sedentary, smoke at least a pack a day  We personally reviewed MRI lumbar spine, mild degenerative changes, 4.9 cm abdominal aortic aneurysm,  MRI cervical spine, 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 [id number] on March 18, 2019, placement of intervertebral biomechanical device, anterior instrumentation  She did very well, was discharged to rehabilitation, readmitted to the hospital on April 10, 2019 for slurred speech, difficulty talking, worsening gait abnormality, multiple falls, and dysphagia, evaluation showed punctuated right MCA territory stroke.  I personally reviewed MRI on April 09, 2019: Several punctuated acute infarction scattered in the right MCA territory, involving right frontal, parietal deep white matter, and gyri surface, extensive supratentorium small vessel disease, old left occipital infarction  CT angiogram of head and neck: Right internal carotid artery 30% stenosis, no measurable stenosis on the left side, intracranial atherosclerotic disease,  She is still at nursing home, with rehabilitation, she has made significant progress, she can ambulate with some assistance, but over the past few weeks, she complains of dizziness, especially when she get up using bathroom in the middle of the night or after prolonged sitting  Today's examination demonstrates orthostatic tachycardia, sitting down blood pressure 142/101, heart rate of 90, standing up 142/90, heart rate of 100; standing up for 1 minutes 135/97, heart rate of 105, she does complains of dizziness,  Laboratory evaluations in January 2021, decreased calcium Blackwell.6, Albumin 2.4, anemia hemoglobin of 11.Blackwell,  UPDATE August 18 2019: She is accompanied by her daughter at today's clinical visit, She Continue Lives at [facility] nursing home, is on [place], daughter re- describe her stroke episode in January 2021, she was found to slumped over, unresponsive for  few minutes, followed by difficulty talking  I personally reviewed MRI of the brain, only punctuated right MCA territory stroke,  less likely explaining personal onset of symptoms, MRI of the brain also showed left occipital infarction, above presentation could indicate a partial seizure  She had loop recorder implant in January 2021, no significant dysrhythmia found, still on Plavix as stroke prevention  She is overall has much improved, she can ambulate without assistant, still has unsteady gait, eager to go home,  But daughter reported she still has unsteady gait, significant hard of hearing, before hospital admission in January 2021, she mistakenly taking all of  her week worth of medication in 3 days, she could not explain why, today's Mini-Mental Status Examination is 27 out of 30    REVIEW OF SYSTEMS: Full 14 system review of systems performed and notable only for as above All other review of systems were negative.  ALLERGIES: Allergies  Allergen Reactions  . Amoxicillin Rash  . Clavulanic Acid Rash    HOME MEDICATIONS: Current Outpatient Medications  Medication Sig Dispense Refill  . atorvastatin (LIPITOR) 20 MG tablet Take 1 tablet (20 mg total) by mouth daily at 6 PM.    . clopidogrel (PLAVIX) 75 MG tablet Take 1 tablet (75 mg total) by mouth daily.    Marland Kitchen FLUoxetine (PROZAC) 20 MG tablet Take 20 mg by mouth at bedtime.    Marland Kitchen HYDROcodone-acetaminophen (NORCO/VICODIN) 5-325 MG tablet Take 1 tablet by mouth every 6 (six) hours as needed for moderate pain.    Marland Kitchen levothyroxine (SYNTHROID) 112 MCG tablet Take 112 mcg by mouth daily before breakfast.     . linaclotide (LINZESS) 145 MCG CAPS capsule Take 145 mcg by mouth daily as needed (constipation).    . magnesium 30 MG tablet Take 1 tablet (30 mg total) by mouth 2 (two) times daily.    . metoprolol succinate (TOPROL XL) 25 MG 24 hr tablet Take 1 tablet (25 mg total) by mouth every morning. 30 tablet 11  . OLANZapine (ZYPREXA) 5 MG tablet Take 5 mg by mouth at bedtime.    Marland Kitchen omeprazole (PRILOSEC) 20 MG capsule Take 20 mg by mouth daily.     . OXYGEN Inhale 2 L  into the lungs at bedtime.    . triamterene-hydrochlorothiazide (DYAZIDE) 37.5-25 MG capsule Take 1 capsule by mouth daily.    . Vitamin D, Ergocalciferol, (DRISDOL) 1.25 MG (50000 UT) CAPS capsule Take 50,000 Units by mouth 2 (two) times a week. Tuesday and Friday     No current facility-administered medications for this visit.    PAST MEDICAL HISTORY: Past Medical History:  Diagnosis Date  . COPD (chronic obstructive pulmonary disease) (HCC)   . Depression with anxiety   . Hypertension   . Hypothyroid   . Stroke [facility]) 2015    PAST SURGICAL HISTORY: Past Surgical History:  Procedure Laterality Date  . ABDOMINAL HYSTERECTOMY    . ANTERIOR CERVICAL DECOMP/DISCECTOMY FUSION N/A 03/18/2019   Procedure: ANTERIOR CERVICAL DECOMPRESSION/DISCECTOMY FUSION, CERVICAL FOUR- CERVICAL FIVE, CERVICAL FIVE- CERVICAL SIX, CERVICAL SIX- CERVICAL SEVEN;  Surgeon: Lisbeth Renshaw, MD;  Location: [facility] OR;  Service: Neurosurgery;  Laterality: N/A;  . CHOLECYSTECTOMY    . EYE SURGERY Right   . IR ANGIO INTRA EXTRACRAN SEL COM CAROTID INNOMINATE BILAT MOD SED  09/19/2016  . IR ANGIO VERTEBRAL SEL VERTEBRAL UNI L MOD SED  09/19/2016  . IR US GUIDE VASC ACCESS RIGHT  09/19/2016  . LOOP RECORDER INSERTION N/A 04/11/2019   Procedure:  LOOP RECORDER INSERTION;  Surgeon: Hillis Range, MD;  Location: [facility] INVASIVE CV LAB;  Service: Cardiovascular;  Laterality: N/A;  . TUMOR REMOVAL      FAMILY HISTORY: Family History  Problem Relation Age of Onset  . Cancer Mother   . Cancer Father   . Colon cancer Neg Hx     SOCIAL HISTORY: Social History   Socioeconomic History  . Marital status: Divorced    Spouse name: Not on file  . Number of children: Not on file  . Years of education: Not on file  . Highest education level: Not on file  Occupational History  . Not on file  Tobacco Use  . Smoking status: Current Every Day Smoker    Packs/day: 1.00    Types: Cigarettes  . Smokeless tobacco: Never Used    Substance and Sexual Activity  . Alcohol use: No  . Drug use: No  . Sexual activity: Not on file  Other Topics Concern  . Not on file  Social History Narrative   Patient lives at home alone. Is a widow  has 3 children and a high school education.   Patient smokes 1 pack of cigarettes and drinks 1-2 cups of coffee.    Social Determinants of Health   Financial Resource Strain:   . Difficulty of Paying Living Expenses:   Food Insecurity:   . Worried About Programme researcher, broadcasting/film/video in the Last Year:   . Barista in the Last Year:   Transportation Needs:   . Freight forwarder (Medical):   Marland Kitchen Lack of Transportation (Non-Medical):   Physical Activity:   . Days of Exercise per Week:   . Minutes of Exercise per Session:   Stress:   . Feeling of Stress :   Social Connections:   . Frequency of Communication with Friends and Family:   . Frequency of Social Gatherings with Friends and Family:   . Attends Religious Services:   . Active Member of Clubs or Organizations:   . Attends Banker Meetings:   Marland Kitchen Marital Status:   Intimate Partner Violence:   . Fear of Current or Ex-Partner:   . Emotionally Abused:   Marland Kitchen Physically Abused:   . Sexually Abused:      PHYSICAL EXAM   Vitals:   08/18/19 1558  BP: (!) 133/98  Pulse: 62    Not recorded    Blood pressure sitting down 142/101, heart rate of 90; standing 142/90, heart rate of 100; for 1 minutes 135/97 heart rate of 105, she complains of dizziness,  There is no height or weight on file to calculate BMI.  PHYSICAL EXAMNIATION:  Gen: NAD, conversant, well nourised, obese, well groomed                     Cardiovascular: Regular rate rhythm, no peripheral edema, warm, nontender. Eyes: Conjunctivae clear without exudates or hemorrhage Neck: Supple, no carotid bruits. Pulmonary: Clear to auscultation bilaterally   NEUROLOGICAL EXAM:  MENTAL STATUS:  MMSE - Mini Mental State Exam 08/18/2019  Orientation to  time 5  Orientation to Place 4  Registration 3  Attention/ Calculation 5  Recall 1  Language- name 2 objects 2  Language- repeat 1  Language- follow 3 step command 3  Language- read & follow direction 1  Write a sentence 1  Copy design 1  Total score 27     CRANIAL NERVES: CN II: Visual fields are full to confrontation.  Pupils are round equal and briskly reactive to light. CN III, IV, VI: extraocular movement are normal. No ptosis. CN V: Facial sensation is intact to pinprick in all 3 divisions bilaterally. Corneal responses are intact.  CN VII: Face is symmetric with normal eye closure and smile. CN VIII: Hearing is normal to rubbing fingers CN IX, X: Palate elevates symmetrically. Phonation is normal. CN XI: Head turning and shoulder shrug are intact CN XII: Tongue is midline with normal movements and no atrophy.  MOTOR: Left arm pronation drift, fixation on rapid rotating movement, mild left handgrip weakness,    REFLEXES: Hyperreflexia on the left upper, and lower extremities, left side Babinski signs,  SENSORY: Length dependent decreased to light touch, pinprick, vibratory sensation to ankle level  COORDINATION: She has no truncal ataxia, no significant limb dysmetria noticed.  GAIT/STANCE: She needs push-up to get up from seated position, wide-based, mildly unsteady, stiff DIAGNOSTIC DATA (LABS, IMAGING, TESTING) - I reviewed patient records, labs, notes, testing and imaging myself where available.   ASSESSMENT AND PLAN  Maria Blackwell is a 79 y.o. female     Stroke  Punctuated acute right MCA stroke on April 09, 2019  Was on ASA 81mg  and plavix both, will stop ASA, keep plavix alone  She had loop recorder placement on April 11, 2019, no significant abnormality found so far  Possible partial seizure  Previous MRI of the brain showed only showed punctuated small right MCA stroke, would not explain daughter reported episode that patient has transient  loss of consciousness, slumped over on April 09, 2019 hospital admission, she did have a history of left occipital stroke in the past, she is at high risk for partial seizure  She continue complains recurrent dizziness spells,  EEG  Depakote 500 mg twice a day   Gait abnormality  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 Ph.D.  [facility] [address], [place], [place] [address] Ph: [phone] Fax: [phone]  CC: Traci Sermon, [place]

## 2019-08-22 ENCOUNTER — Non-Acute Institutional Stay: Payer: Medicare Other | Admitting: Hospice

## 2019-08-22 ENCOUNTER — Other Ambulatory Visit: Payer: Self-pay

## 2019-08-22 DIAGNOSIS — I63011 Cerebral infarction due to thrombosis of right vertebral artery: Secondary | ICD-10-CM

## 2019-08-22 DIAGNOSIS — Z515 Encounter for palliative care: Secondary | ICD-10-CM

## 2019-08-22 NOTE — Progress Notes (Signed)
[facility] Consult Note Telephone: [phone]  Fax: [phone]  PATIENT NAME: Maria Blackwell DOB: 05/08/1940 MRN: [id number]  PRIMARY CARE PROVIDER:   Josetta Huddle, MD  REFERRING PROVIDER:  Josetta Huddle, MD [address] [place],  [place] [address]  RESPONSIBLE PARTY:     REFERRING PROVIDER:Dr. R gates RESPONSIBLE PARTY:Daughter - Sheliah Hatch [phone]   RECOMMENDATIONS/PLAN:  Advance Care Planning/Goals of Care: Visit consisted of building trust and follow up on palliative care. Patient is a DNR. DNR in facility chart.Goals of care include to maximize quality of life and symptom management.   Visit consisted of counseling and education dealing with the complex and emotionally intense issues of symptom management and palliative care in the setting of serious and potentially life-threatening illness.  Called [facility] and left her a voicemail with callback number. Symptom management:Patient was seen by neurologist 08/18/2019;  She was started on Depakote 500 mg twice daily because she is at high risk for partial seizure following her previous stroke.  No report of seizure or adverse reaction to new medication.  She had recent neck surgery C6-C7 cervical fusion with decompression of spinal cord. She denied pain/discomfort.  Chart review shows patient had loop recorder implant in January 2021, with no significant dysrhythmia found; she continues on Plavix for stroke prevention.  Aspirin was stopped 08/18/2019 by her Neurologist. Patient is  stand by to little assist in ADLs. She is in no distress, FLACC 0.  No report of choking.  She continues on  chopped diet and thin liquids.  Aspiration precaution in place; she is also on Magic cup, med pass for added caloric nutrition. Nursing with no concerns; patient doing well and compliant with her medications.  Follow DX:AJOINOMVEH care will continue to follow patient for  goals of care clarification and symptom management. I spent48minutes providing this consultation; time includes chart review and documentation. More than 50% of the time in this consultation was spent on coordinating communication  HISTORY OF PRESENT ILLNESS:Maria J Sheltonis a 79 y.o.year oldfemalewith multiple medical problems including CVA, cervical myelopathy with recent neck surgeryPalliative Care was asked to help address goals of care.    CODE STATUS: DNR  PPS: 50% HOSPICE ELIGIBILITY/DIAGNOSIS: TBD  PAST MEDICAL HISTORY:  Past Medical History:  Diagnosis Date  . COPD (chronic obstructive pulmonary disease) ([facility])   . Depression with anxiety   . Hypertension   . Hypothyroid   . Stroke [facility]) 2015    SOCIAL HX:  Social History   Tobacco Use  . Smoking status: Current Every Day Smoker    Packs/day: 1.00    Types: Cigarettes  . Smokeless tobacco: Never Used  Substance Use Topics  . Alcohol use: No    ALLERGIES:  Allergies  Allergen Reactions  . Amoxicillin Rash  . Clavulanic Acid Rash     PERTINENT MEDICATIONS:  Outpatient Encounter Medications as of 08/22/2019  Medication Sig  . atorvastatin (LIPITOR) 20 MG tablet Take 1 tablet (20 mg total) by mouth daily at 6 PM.  . clopidogrel (PLAVIX) 75 MG tablet Take 1 tablet (75 mg total) by mouth daily.  . divalproex (DEPAKOTE) 500 MG DR tablet Take 1 tablet (500 mg total) by mouth 2 (two) times daily.  Marland Kitchen FLUoxetine (PROZAC) 20 MG tablet Take 20 mg by mouth at bedtime.  Marland Kitchen HYDROcodone-acetaminophen (NORCO/VICODIN) 5-325 MG tablet Take 1 tablet by mouth every 6 (six) hours as needed for moderate pain.  Marland Kitchen levothyroxine (  SYNTHROID) 112 MCG tablet Take 112 mcg by mouth daily before breakfast.   . linaclotide (LINZESS) 145 MCG CAPS capsule Take 145 mcg by mouth daily as needed (constipation).  . magnesium 30 MG tablet Take 1 tablet (30 mg total) by mouth 2 (two) times daily.  . metoprolol succinate (TOPROL XL) 25 MG  24 hr tablet Take 1 tablet (25 mg total) by mouth every morning.  Marland Kitchen OLANZapine (ZYPREXA) 5 MG tablet Take 5 mg by mouth at bedtime.  Marland Kitchen omeprazole (PRILOSEC) 20 MG capsule Take 20 mg by mouth daily.   . OXYGEN Inhale 2 L into the lungs at bedtime.  . triamterene-hydrochlorothiazide (DYAZIDE) 37.5-25 MG capsule Take 1 capsule by mouth daily.  . Vitamin D, Ergocalciferol, (DRISDOL) 1.25 MG (50000 UT) CAPS capsule Take 50,000 Units by mouth 2 (two) times a week. Tuesday and Friday   No facility-administered encounter medications on file as of 08/22/2019.    PHYSICAL EXAM/ROS:  General: NAD, cooperative Cardiovascular: regular rate and rhythm; denies chest pain/discomfort Pulmonary: clear ant/post fields;  Abdomen: soft, nontender, + bowel sounds GU: no suprapubic tenderness Extremities: no edema, no joint deformities Skin: no rashes to exposed skin Neurological: Weakness but otherwise nonfocal  Rosaura Carpenter, NP

## 2019-08-25 ENCOUNTER — Ambulatory Visit (INDEPENDENT_AMBULATORY_CARE_PROVIDER_SITE_OTHER): Payer: Medicare Other | Admitting: *Deleted

## 2019-08-25 DIAGNOSIS — I63011 Cerebral infarction due to thrombosis of right vertebral artery: Secondary | ICD-10-CM

## 2019-08-26 LAB — CUP PACEART REMOTE DEVICE CHECK
Date Time Interrogation Session: 20210613230513
Implantable Pulse Generator Implant Date: 20210129

## 2019-08-26 NOTE — Progress Notes (Signed)
[facility] Summary Report / Loop Recorder 

## 2019-09-01 ENCOUNTER — Other Ambulatory Visit: Payer: Self-pay | Admitting: Physician Assistant

## 2019-09-01 DIAGNOSIS — I671 Cerebral aneurysm, nonruptured: Secondary | ICD-10-CM

## 2019-09-08 ENCOUNTER — Other Ambulatory Visit: Payer: Self-pay | Admitting: Internal Medicine

## 2019-09-08 DIAGNOSIS — R4182 Altered mental status, unspecified: Secondary | ICD-10-CM

## 2019-09-09 ENCOUNTER — Other Ambulatory Visit: Payer: Self-pay

## 2019-09-09 ENCOUNTER — Ambulatory Visit
Admission: RE | Admit: 2019-09-09 | Discharge: 2019-09-09 | Disposition: A | Discharge status: Home / Self Care | Payer: Medicare Other | Source: Ambulatory Visit | Attending: Internal Medicine | Admitting: Internal Medicine

## 2019-09-09 DIAGNOSIS — R4182 Altered mental status, unspecified: Secondary | ICD-10-CM

## 2019-09-10 ENCOUNTER — Telehealth: Payer: Self-pay | Admitting: Neurology

## 2019-09-10 NOTE — Telephone Encounter (Signed)
Dr. Terrace Arabia spoke w/ Maria Gammon, NP at [facility] in [facility]. This patient has an pending EEG on 09/17/19. She would also like to see her in the office.

## 2019-09-10 NOTE — Telephone Encounter (Signed)
Anette Riedel from [facility] called needing to speak to RN or Provider about the sever decline the pt has had since she was last seen on June 7th and put on medication. Please advise.

## 2019-09-11 NOTE — Telephone Encounter (Signed)
I have spoken to Dr. Terrace Arabia. The patient will be completing her EEG on 7/7. She will be worked in for a follow up on 7/8. She will need to arrive to the office at 3:30pm. I called [facility] in [place] [phone]). I spoke to [place] who is aware of the patient's appt time.

## 2019-09-17 ENCOUNTER — Ambulatory Visit (INDEPENDENT_AMBULATORY_CARE_PROVIDER_SITE_OTHER): Payer: Medicare Other | Admitting: Neurology

## 2019-09-17 ENCOUNTER — Other Ambulatory Visit: Payer: Self-pay | Admitting: Physician Assistant

## 2019-09-17 DIAGNOSIS — G40209 Localization-related (focal) (partial) symptomatic epilepsy and epileptic syndromes with complex partial seizures, not intractable, without status epilepticus: Secondary | ICD-10-CM | POA: Diagnosis not present

## 2019-09-17 DIAGNOSIS — G959 Disease of spinal cord, unspecified: Secondary | ICD-10-CM

## 2019-09-17 DIAGNOSIS — I6310 Cerebral infarction due to embolism of unspecified precerebral artery: Secondary | ICD-10-CM

## 2019-09-17 DIAGNOSIS — R269 Unspecified abnormalities of gait and mobility: Secondary | ICD-10-CM

## 2019-09-17 DIAGNOSIS — R471 Dysarthria and anarthria: Secondary | ICD-10-CM

## 2019-09-18 ENCOUNTER — Telehealth: Payer: Self-pay | Admitting: Neurology

## 2019-09-18 ENCOUNTER — Encounter: Payer: Self-pay | Admitting: Neurology

## 2019-09-18 ENCOUNTER — Other Ambulatory Visit: Payer: Self-pay

## 2019-09-18 ENCOUNTER — Ambulatory Visit (INDEPENDENT_AMBULATORY_CARE_PROVIDER_SITE_OTHER): Payer: Medicare Other | Admitting: Neurology

## 2019-09-18 VITALS — BP 112/70 | HR 69

## 2019-09-18 DIAGNOSIS — G959 Disease of spinal cord, unspecified: Secondary | ICD-10-CM | POA: Diagnosis not present

## 2019-09-18 DIAGNOSIS — R269 Unspecified abnormalities of gait and mobility: Secondary | ICD-10-CM | POA: Diagnosis not present

## 2019-09-18 DIAGNOSIS — I6310 Cerebral infarction due to embolism of unspecified precerebral artery: Secondary | ICD-10-CM

## 2019-09-18 NOTE — Telephone Encounter (Signed)
Get laboratory evaluation in June 2021 from her facility

## 2019-09-18 NOTE — Progress Notes (Signed)
PATIENT: Maria Blackwell DOB: 1941-01-15  Chief Complaint  Patient presents with  . Follow-up    She is here with her daughter, Eusebio Friendly. She resides at [facility] in [place]. Tamela Gammon, NP, caregiver at the facility, reported a decline in the patient's health to our office. The patient completed EEG on 09/17/19 and is here today for an early follow up. She took her last dose of Cipro today for he UTI. Her daughter feels she is much better this week.     HISTORICAL  Maria Blackwell is a 79 year old female, seen in request by her primary care PA [place], Darci Current for evaluation of gait difficulty, initial evaluation was on October 17, 2018.  She is accompanied by her daughter Maria Blackwell at today's visit  I have reviewed and summarized the referring note from the referring physician.  She has past medical history of hypertension, hypothyroidism, on supplement, she was at her baseline at the beginning of 2020, had gradual onset but rapid decline of gait abnormality, she fell frequently, both leg give out underneath her, both arms were shaky, ambulate use of a walker, about the same time, she began to have worsening urinary urgency, incontinence, she does complains of low back pain, radiating pain to left hip, there was no significant bilateral lower extremity paresthesia, she has mild neck pain  UPDATE Sept 1 2020: She is with her daughter at today's clinical visit, she continue complains of slow decline gait abnormality, urinary urgency, incontinence, neck pain, radiating to her spine, she is very sedentary, smoke at least a pack a day  We personally reviewed MRI lumbar spine, mild degenerative changes, 4.9 cm abdominal aortic aneurysm,  MRI cervical spine, 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 Conchita Paris on March 18, 2019, placement of intervertebral biomechanical device, anterior instrumentation  She did very well, was discharged to rehabilitation, readmitted to the hospital on April 10, 2019 for slurred speech, difficulty talking, worsening gait abnormality, multiple falls, and dysphagia, evaluation showed punctuated right MCA territory stroke.  I personally reviewed MRI on April 09, 2019: Several punctuated acute infarction scattered in the right MCA territory, involving right frontal, parietal deep white matter, and gyri surface, extensive supratentorium small vessel disease, old left occipital infarction  CT angiogram of head and neck: Right internal carotid artery 30% stenosis, no measurable stenosis on the left side, intracranial atherosclerotic disease,  She is still at nursing home, with rehabilitation, she has made significant progress, she can ambulate with some assistance, but over the past few weeks, she complains of dizziness, especially when she get up using bathroom in the middle of the night or after prolonged sitting  Today's examination demonstrates orthostatic tachycardia, sitting down blood pressure 142/101, heart rate of 90, standing up 142/90, heart rate of 100; standing up for 1 minutes 135/97, heart rate of 105, she does complains of dizziness,  Laboratory evaluations in January 2021, decreased calcium 8.6, Albumin 2.4, anemia hemoglobin of 11.8,  UPDATE August 18 2019: She is accompanied by her daughter at  today's clinical visit, She Continue Lives at [facility] nursing home, is on [place], daughter re- describe her stroke episode in January 2021, she was found to slumped over, unresponsive  for few minutes, followed by difficulty talking  I personally reviewed MRI of the brain, only punctuated right MCA territory stroke, less likely explaining personal onset of symptoms, MRI of the brain also showed left occipital infarction, above presentation could indicate a partial seizure  She had loop recorder implant in January 2021, no significant dysrhythmia found, still on Plavix as stroke prevention  She is overall has much improved, she can ambulate without assistant, still has unsteady gait, eager to go home,  But daughter reported she still has unsteady gait, significant hard of hearing, before hospital admission in January 2021, she mistakenly taking all of  her week worth of medication in 3 days, she could not explain why, today's Mini-Mental Status Examination is 40 out of 30  Update September 18, 2019, With a reported episode of sudden onset confusion, slumped over in January 2021, history of previous left occipital cortical stroke by MRI, there was a possibility of partial seizure, I empirically treat her with DepakoteDR 500 mg twice a day in early June, couple weeks later, she was noted to have increased confusion, fatigue, speech difficulty by facility provider, Depakote was stopped, she had a gradual improvement, per daughter reviewing laboratory evaluation was taken, we will get the record  REVIEW OF SYSTEMS: Full 14 system review of systems performed and notable only for as above All other review of systems were negative.  ALLERGIES: Allergies  Allergen Reactions  . Amoxicillin Rash  . Clavulanic Acid Rash    HOME MEDICATIONS: Current Outpatient Medications  Medication Sig Dispense Refill  . acetaminophen (TYLENOL) 325 MG tablet Take 650 mg by mouth every 8 (eight) hours as needed.    . ARIPiprazole (ABILIFY) 5 MG tablet Take 5 mg by mouth daily.    Marland Kitchen atorvastatin (LIPITOR) 20 MG tablet Take 1 tablet (20 mg total) by mouth daily at 6 PM.    . clopidogrel (PLAVIX) 75 MG  tablet Take 1 tablet (75 mg total) by mouth daily.    . ferrous gluconate (FERGON) 324 MG tablet Take 324 mg by mouth daily.    Marland Kitchen FLUoxetine (PROZAC) 20 MG tablet Take 20 mg by mouth at bedtime.    Marland Kitchen HYDROcodone-acetaminophen (NORCO/VICODIN) 5-325 MG tablet Take 1 tablet by mouth every 6 (six) hours as needed for moderate pain.    Marland Kitchen levothyroxine (SYNTHROID) 112 MCG tablet Take 112 mcg by mouth daily before breakfast.     . linaclotide (LINZESS) 145 MCG CAPS capsule Take 145 mcg by mouth daily as needed (constipation).    . Magnesium 100 MG TABS Take 200 mg by mouth daily.    . magnesium hydroxide (MILK OF MAGNESIA) 400 MG/5ML suspension Take 30 mLs by mouth every three (3) days as needed for mild constipation.    . metoprolol succinate (TOPROL XL) 25 MG 24 hr tablet Take 1 tablet (25 mg total) by mouth every morning. 30 tablet 11  . omeprazole (PRILOSEC) 20 MG capsule Take 20 mg by mouth daily.     . OXYGEN Inhale 2 L into the lungs at bedtime as needed.     . Vitamin D, Ergocalciferol, (DRISDOL) 1.25 MG (50000 UT) CAPS capsule Take 50,000 Units by mouth 2 (two) times a week. Tuesday and Friday     No current facility-administered medications for this visit.    PAST MEDICAL  HISTORY: Past Medical History:  Diagnosis Date  . COPD (chronic obstructive pulmonary disease) (HCC)   . Depression with anxiety   . Hypertension   . Hypothyroid   . Stroke [facility]) 2015    PAST SURGICAL HISTORY: Past Surgical History:  Procedure Laterality Date  . ABDOMINAL HYSTERECTOMY    . ANTERIOR CERVICAL DECOMP/DISCECTOMY FUSION N/A 03/18/2019   Procedure: ANTERIOR CERVICAL DECOMPRESSION/DISCECTOMY FUSION, CERVICAL FOUR- CERVICAL FIVE, CERVICAL FIVE- CERVICAL SIX, CERVICAL SIX- CERVICAL SEVEN;  Surgeon: Lisbeth Renshaw, MD;  Location: [facility] OR;  Service: Neurosurgery;  Laterality: N/A;  . CHOLECYSTECTOMY    . EYE SURGERY Right   . IR ANGIO INTRA EXTRACRAN SEL COM CAROTID INNOMINATE BILAT MOD SED  09/19/2016  .  IR ANGIO VERTEBRAL SEL VERTEBRAL UNI L MOD SED  09/19/2016  . IR US GUIDE VASC ACCESS RIGHT  09/19/2016  . LOOP RECORDER INSERTION N/A 04/11/2019   Procedure: LOOP RECORDER INSERTION;  Surgeon: Hillis Range, MD;  Location: [facility] INVASIVE CV LAB;  Service: Cardiovascular;  Laterality: N/A;  . TUMOR REMOVAL      FAMILY HISTORY: Family History  Problem Relation Age of Onset  . Cancer Mother   . Cancer Father   . Colon cancer Neg Hx     SOCIAL HISTORY: Social History   Socioeconomic History  . Marital status: Divorced    Spouse name: Not on file  . Number of children: Not on file  . Years of education: Not on file  . Highest education level: Not on file  Occupational History  . Not on file  Tobacco Use  . Smoking status: Current Every Day Smoker    Packs/day: 1.00    Types: Cigarettes  . Smokeless tobacco: Never Used  Vaping Use  . Vaping Use: Never used  Substance and Sexual Activity  . Alcohol use: No  . Drug use: No  . Sexual activity: Not on file  Other Topics Concern  . Not on file  Social History Narrative   Patient lives at home alone. Is a widow  has 3 children and a high school education.   Patient smokes 1 pack of cigarettes and drinks 1-2 cups of coffee.    Social Determinants of Health   Financial Resource Strain:   . Difficulty of Paying Living Expenses:   Food Insecurity:   . Worried About Programme researcher, broadcasting/film/video in the Last Year:   . Barista in the Last Year:   Transportation Needs:   . Freight forwarder (Medical):   Marland Kitchen Lack of Transportation (Non-Medical):   Physical Activity:   . Days of Exercise per Week:   . Minutes of Exercise per Session:   Stress:   . Feeling of Stress :   Social Connections:   . Frequency of Communication with Friends and Family:   . Frequency of Social Gatherings with Friends and Family:   . Attends Religious Services:   . Active Member of Clubs or Organizations:   . Attends Banker Meetings:   Marland Kitchen  Marital Status:   Intimate Partner Violence:   . Fear of Current or Ex-Partner:   . Emotionally Abused:   Marland Kitchen Physically Abused:   . Sexually Abused:      PHYSICAL EXAM   Vitals:   09/18/19 1605  BP: 112/70  Pulse: 69   Not recorded   Blood pressure sitting down 142/101, heart rate of 90; standing 142/90, heart rate of 100; for 1 minutes 135/97 heart rate of 105, she  complains of dizziness,  There is no height or weight on file to calculate BMI.  PHYSICAL EXAMNIATION:  Gen: NAD, conversant, well nourised, obese, well groomed                     Cardiovascular: Regular rate rhythm, no peripheral edema, warm, nontender. Eyes: Conjunctivae clear without exudates or hemorrhage Neck: Supple, no carotid bruits. Pulmonary: Clear to auscultation bilaterally   NEUROLOGICAL EXAM:  MENTAL STATUS:  MMSE - Mini Mental State Exam 08/18/2019  Orientation to time 5  Orientation to Place 4  Registration 3  Attention/ Calculation 5  Recall 1  Language- name 2 objects 2  Language- repeat 1  Language- follow 3 step command 3  Language- read & follow direction 1  Write a sentence 1  Copy design 1  Total score 27     CRANIAL NERVES: CN II: Visual fields are full to confrontation.  Pupils are round equal and briskly reactive to light. CN III, IV, VI: extraocular movement are normal. No ptosis. CN V: Facial sensation is intact to pinprick in all 3 divisions bilaterally. Corneal responses are intact.  CN VII: Face is symmetric with normal eye closure and smile. CN VIII: Hard of hearing CN IX, X: Palate elevates symmetrically. Phonation is normal. CN XI: Head turning and shoulder shrug are intact  MOTOR: Left arm pronation drift, fixation on rapid rotating movement, mild left handgrip weakness,    REFLEXES: Hyperreflexia of bilateral patella,  SENSORY: Length dependent decreased to light touch, pinprick, vibratory sensation to ankle level  COORDINATION: She has no truncal  ataxia, no significant limb dysmetria noticed.  GAIT/STANCE: She needs 2 people assistant to get up from seated position, mildly unsteady, cautious gait  DIAGNOSTIC DATA (LABS, IMAGING, TESTING) - I reviewed patient records, labs, notes, testing and imaging myself where available.   ASSESSMENT AND PLAN  Maria Blackwell is a 79 y.o. female     Stroke  Punctuated acute right MCA stroke on April 09, 2019  Was on ASA 81mg  and plavix both, will stop ASA, keep plavix alone  She had loop recorder placement on April 11, 2019, no significant abnormality found so far  Intermittent increased confusion spells  Previous MRI of the brain showed only showed punctuated small right MCA stroke, would not explain daughter reported episode that patient has transient loss of consciousness, slumped over before April 09, 2019 hospital admission, she did have a history of left occipital stroke in the past, she is at high risk for partial seizure  She continue complains recurrent dizziness spells,  EEG in July 2021 showed moderate slowing, no epileptiform discharge  When Depakote 500 mg twice a day was started in early June, 1 to 2 weeks later, she was noted to have increased confusion, fatigue, lack of motivation, mild improvement since starting medication, also adjustment of her antidepression medication, Abilify 5 mg was added on,   Reported intermittent confusion spells, could be due to medication side effect, in the background of mild cognitive impairment, cannot rule out possibility of seizure, advised daughter document the event, call office for recurrent spells  Laboratory evaluation from her facility   Gait abnormality  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 Ph.D.  [facility] [address], [address] [place], [place] [address] Ph: [phone]  [phone] Fax: [phone]  CC: Alyson Ingles, PA-C

## 2019-09-23 NOTE — Telephone Encounter (Signed)
Done request faxed to facility.

## 2019-09-29 ENCOUNTER — Ambulatory Visit (INDEPENDENT_AMBULATORY_CARE_PROVIDER_SITE_OTHER): Payer: Medicare Other | Admitting: *Deleted

## 2019-09-29 DIAGNOSIS — I6340 Cerebral infarction due to embolism of unspecified cerebral artery: Secondary | ICD-10-CM | POA: Diagnosis not present

## 2019-09-29 LAB — CUP PACEART REMOTE DEVICE CHECK
Date Time Interrogation Session: 20210718230419
Implantable Pulse Generator Implant Date: 20210129

## 2019-09-30 ENCOUNTER — Non-Acute Institutional Stay: Payer: Medicare Other | Admitting: Hospice

## 2019-09-30 ENCOUNTER — Other Ambulatory Visit: Payer: Self-pay

## 2019-09-30 DIAGNOSIS — I63011 Cerebral infarction due to thrombosis of right vertebral artery: Secondary | ICD-10-CM

## 2019-09-30 DIAGNOSIS — Z515 Encounter for palliative care: Secondary | ICD-10-CM

## 2019-09-30 NOTE — Progress Notes (Signed)
Therapist, nutritional Palliative Care Consult Note Telephone: [phone]  Fax: [phone]  PATIENT NAME:Maria Blackwell DOB:04-27-1940 HBZ:[id number]  PRIMARY CARE PROVIDER:Gates, Molly Maduro, MD  REFERRING PROVIDER:Gates, Molly Maduro, MD [address] [place], [place] [address]  REFERRING PROVIDER:Dr. R gates RESPONSIBLE PARTY:Daughter - Eusebio Friendly [phone]   RECOMMENDATIONS/PLAN:  Advance Care Planning/Goals of Care: Patient is a DNR. DNR in facility chart.Goals of care include to maximize quality of life and symptom management. Ample emotional support provided as patient mentioned she recently had cataract surgery to her left eye.   NP called [facility] and left her a voicemail with callback number.  Palliative care will continue to provide support to patient, family, and the medical team. Symptom management:Left eye with no swelling or redness following recent left eye cataract surgery.  Patient treated for UTI last dose antibiotics 09/17/2019.  No urinary symptoms today.  She denied pain/discomfort.  She is on oxygen supplementation 2 L/min for COPD; no recent COPD flare.  Patient is compliant with her medications. Chart review shows patient had loop recorder implant in January 2021, with no significant dysrhythmia found; she continues on Plavix for stroke prevention.Patient is  stand by to little assist in ADLs. She is in no distress, FLACC 0.  Aspiration precaution and fall precautions in place;  she continues on Magic cup, med pass for added caloric nutrition. Nursing with no concerns; patient doing well and compliant with her medications. Follow EU:[id number] care will continue to follow patient for goals of care clarification and symptom management. I spent42minutes providing this consultation; time includes time with patient/nursing, chart review and documentation.More than 50% of the time in this consultation was spent on  coordinating communication  HISTORY OF PRESENT ILLNESS:Maria J Sheltonis a 79 y.o.year oldfemalewith multiple medical problems including CVA, cervical myelopathy with recent neck surgery, COPD.Palliative Care was asked to help address goals of care.  CODE STATUS:DNR  PPS:50% HOSPICE ELIGIBILITY/DIAGNOSIS: TBD  PAST MEDICAL HISTORY:  Past Medical History:  Diagnosis Date  . COPD (chronic obstructive pulmonary disease) (HCC)   . Depression with anxiety   . Hypertension   . Hypothyroid   . Stroke [facility]) 2015    SOCIAL HX:  Social History   Tobacco Use  . Smoking status: Current Every Day Smoker    Packs/day: 1.00    Types: Cigarettes  . Smokeless tobacco: Never Used  Substance Use Topics  . Alcohol use: No    ALLERGIES:  Allergies  Allergen Reactions  . Amoxicillin Rash  . Clavulanic Acid Rash     PERTINENT MEDICATIONS:  Outpatient Encounter Medications as of 09/30/2019  Medication Sig  . acetaminophen (TYLENOL) 325 MG tablet Take 650 mg by mouth every 8 (eight) hours as needed.  . ARIPiprazole (ABILIFY) 5 MG tablet Take 5 mg by mouth daily.  Marland Kitchen atorvastatin (LIPITOR) 20 MG tablet Take 1 tablet (20 mg total) by mouth daily at 6 PM.  . clopidogrel (PLAVIX) 75 MG tablet Take 1 tablet (75 mg total) by mouth daily.  . ferrous gluconate (FERGON) 324 MG tablet Take 324 mg by mouth daily.  Marland Kitchen FLUoxetine (PROZAC) 20 MG tablet Take 20 mg by mouth at bedtime.  Marland Kitchen HYDROcodone-acetaminophen (NORCO/VICODIN) 5-325 MG tablet Take 1 tablet by mouth every 6 (six) hours as needed for moderate pain.  Marland Kitchen levothyroxine (SYNTHROID) 112 MCG tablet Take 112 mcg by mouth daily before breakfast.   . linaclotide (LINZESS) 145 MCG CAPS capsule Take 145 mcg by mouth daily as  needed (constipation).  . Magnesium 100 MG TABS Take 200 mg by mouth daily.  . magnesium hydroxide (MILK OF MAGNESIA) 400 MG/5ML suspension Take 30 mLs by mouth every three (3) days as needed for mild constipation.  .  metoprolol succinate (TOPROL XL) 25 MG 24 hr tablet Take 1 tablet (25 mg total) by mouth every morning.  Marland Kitchen omeprazole (PRILOSEC) 20 MG capsule Take 20 mg by mouth daily.   . OXYGEN Inhale 2 L into the lungs at bedtime as needed.   . Vitamin D, Ergocalciferol, (DRISDOL) 1.25 MG (50000 UT) CAPS capsule Take 50,000 Units by mouth 2 (two) times a week. Tuesday and Friday   No facility-administered encounter medications on file as of 09/30/2019.    PHYSICAL EXAM/ROS:  General: NAD, cooperative Cardiovascular: regular rate and rhythm; denies chest pain/discomfort Pulmonary: clear ant/postfields;  oxygen supplementation 2 L/min, no coughing no shortness of breath Abdomen: soft, nontender, + bowel sounds GU: no suprapubic tenderness Extremities: no edema, no joint deformities Skin: no rashesto exposed skin Neurological: Weakness but otherwise nonfocal  Rosaura Carpenter, NP

## 2019-10-01 NOTE — Progress Notes (Signed)
[facility] Summary Report / Loop Recorder 

## 2019-10-23 ENCOUNTER — Inpatient Hospital Stay: Admission: RE | Admit: 2019-10-23 | Payer: Medicare Other | Source: Ambulatory Visit

## 2019-11-03 ENCOUNTER — Ambulatory Visit (INDEPENDENT_AMBULATORY_CARE_PROVIDER_SITE_OTHER): Payer: Medicare Other | Admitting: *Deleted

## 2019-11-03 DIAGNOSIS — I6340 Cerebral infarction due to embolism of unspecified cerebral artery: Secondary | ICD-10-CM

## 2019-11-03 LAB — CUP PACEART REMOTE DEVICE CHECK
Date Time Interrogation Session: 20210820230040
Implantable Pulse Generator Implant Date: 20210129

## 2019-11-10 NOTE — Progress Notes (Signed)
[facility] Summary Report / Loop Recorder 

## 2019-11-26 ENCOUNTER — Ambulatory Visit: Payer: Medicare Other | Admitting: Neurology

## 2019-12-03 LAB — CUP PACEART REMOTE DEVICE CHECK
Date Time Interrogation Session: 20210922230555
Implantable Pulse Generator Implant Date: 20210129

## 2019-12-08 ENCOUNTER — Ambulatory Visit (INDEPENDENT_AMBULATORY_CARE_PROVIDER_SITE_OTHER): Payer: Medicare Other | Admitting: Emergency Medicine

## 2019-12-08 DIAGNOSIS — I6310 Cerebral infarction due to embolism of unspecified precerebral artery: Secondary | ICD-10-CM | POA: Diagnosis not present

## 2019-12-11 NOTE — Progress Notes (Signed)
[facility] Summary Report / Loop Recorder 

## 2019-12-13 NOTE — Procedures (Signed)
   HISTORY: 79 year old female had episode of slumped over, unresponsive, difficulty talking,  TECHNIQUE:  This is a routine 16 channel EEG recording with one channel devoted to a limited EKG recording.  It was performed during wakefulness, drowsiness and asleep.  Hyperventilation and photic stimulation were performed as activating procedures.  There are minimum muscle and movement artifact noted.  Upon maximum arousal, posterior dominant waking rhythm consistent of mildly dysrhythmic activity, in the theta range, 6 Hz. Activities are symmetric over the bilateral posterior derivations and attenuated with eye opening.  Hyperventilation produced mild/moderate buildup with higher amplitude and the slower activities noted.  Photic stimulation did not alter the tracing.  During EEG recording, patient developed drowsiness and no deeper stage of sleep was achieved During EEG recording, there was no epileptiform discharge noted.  EKG demonstrate sinus rhythm, with heart rate of 72 bpm  CONCLUSION: This is a  normal abnormal awake EEG.  There is evidence of mild background slowing, indicating mild bihemispheric malfunction.  Common etiology are metabolic toxic, versus advanced central nervous system degenerative disorder. Levert Feinstein, M.D. Ph.D.  [facility] [address] [place], [place] [address] Phone: [phone] Fax:      [phone]

## 2020-01-10 LAB — CUP PACEART REMOTE DEVICE CHECK
Date Time Interrogation Session: 20211025230528
Implantable Pulse Generator Implant Date: 20210129

## 2020-01-12 ENCOUNTER — Ambulatory Visit (INDEPENDENT_AMBULATORY_CARE_PROVIDER_SITE_OTHER): Payer: Medicare Other

## 2020-01-12 DIAGNOSIS — I6350 Cerebral infarction due to unspecified occlusion or stenosis of unspecified cerebral artery: Secondary | ICD-10-CM | POA: Diagnosis not present

## 2020-01-15 NOTE — Progress Notes (Signed)
[facility] Summary Report / Loop Recorder 

## 2020-01-27 ENCOUNTER — Telehealth: Payer: Self-pay | Admitting: Neurology

## 2020-01-27 ENCOUNTER — Ambulatory Visit: Payer: Self-pay | Admitting: Neurology

## 2020-01-27 NOTE — Telephone Encounter (Signed)
Noted, placed on cancellation list.

## 2020-01-27 NOTE — Telephone Encounter (Signed)
Patient was scheduled today to see Maralyn Sago. I called the patient and spoke with [name] (daughter) to reschedule. I offered this Thursday with Dr. Terrace Arabia at 11:30. [facility] states they are not able to take this appointment and would need a late afternoon appointment (Sarah's latest). I advised that RN would call back with an opening because there are no other openings at this time. FYI

## 2020-02-14 LAB — CUP PACEART REMOTE DEVICE CHECK
Date Time Interrogation Session: 20211127230402
Implantable Pulse Generator Implant Date: 20210129

## 2020-02-16 ENCOUNTER — Ambulatory Visit (INDEPENDENT_AMBULATORY_CARE_PROVIDER_SITE_OTHER): Payer: Medicare Other

## 2020-02-16 DIAGNOSIS — I6350 Cerebral infarction due to unspecified occlusion or stenosis of unspecified cerebral artery: Secondary | ICD-10-CM | POA: Diagnosis not present

## 2020-02-19 ENCOUNTER — Other Ambulatory Visit: Payer: Self-pay

## 2020-02-19 ENCOUNTER — Ambulatory Visit (INDEPENDENT_AMBULATORY_CARE_PROVIDER_SITE_OTHER): Payer: Medicare Other | Admitting: Neurology

## 2020-02-19 ENCOUNTER — Encounter: Payer: Self-pay | Admitting: Neurology

## 2020-02-19 VITALS — BP 126/84 | HR 78

## 2020-02-19 DIAGNOSIS — I6340 Cerebral infarction due to embolism of unspecified cerebral artery: Secondary | ICD-10-CM

## 2020-02-19 DIAGNOSIS — R269 Unspecified abnormalities of gait and mobility: Secondary | ICD-10-CM | POA: Diagnosis not present

## 2020-02-19 DIAGNOSIS — R251 Tremor, unspecified: Secondary | ICD-10-CM | POA: Diagnosis not present

## 2020-02-19 NOTE — Patient Instructions (Addendum)
Will recommend involvement in physical therapy  Continue current medications Labs have been normal (CBC, BMP, TSH, Mag) See you back in 6 months

## 2020-02-19 NOTE — Progress Notes (Signed)
PATIENT: Maria Blackwell DOB: [birth year]  REASON FOR VISIT: follow up HISTORY FROM: patient  HISTORY OF PRESENT ILLNESS: Today 02/19/20  HISTORY  Maria Blackwell is a 79 year old female, seen in request by her primary care PA [place], Maria Blackwell for evaluation of gait difficulty, initial evaluation was on October 17, 2018.  She is accompanied by her daughter Maria Blackwell at today's visit  I have reviewed and summarized the referring note from the referring physician.  She has past medical history of hypertension, hypothyroidism, on supplement, she was at her baseline at the beginning of 2020, had gradual onset but rapid decline of gait abnormality, she fell frequently, both leg give out underneath her, both arms were shaky, ambulate use of a walker, about the same time, she began to have worsening urinary urgency, incontinence, she does complains of low back pain, radiating pain to left hip, there was no significant bilateral lower extremity paresthesia, she has mild neck pain  UPDATE Sept 1 2020: She is with her daughter at today's clinical visit, she continue complains of slow decline gait abnormality, urinary urgency, incontinence, neck pain, radiating to her spine, she is very sedentary, smoke at least a pack a day  We personally reviewed MRI lumbar spine, mild degenerative changes, 4.9 cm abdominal aortic aneurysm,  MRI cervical spine, 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 Conchita Paris on March 18, 2019, placement of  intervertebral biomechanical device, anterior instrumentation  She did very well, was discharged to rehabilitation, readmitted to the hospital on April 10, 2019 for slurred speech, difficulty talking, worsening gait abnormality, multiple falls, and dysphagia, evaluation showed punctuated right MCA territory stroke.  I personally reviewed MRI on April 09, 2019: Several punctuated acute infarction scattered in the right MCA territory, involving right frontal, parietal deep white matter, and gyri surface, extensive supratentorium small vessel disease, old left occipital infarction  CT angiogram of head and neck: Right internal carotid artery 30% stenosis, no measurable stenosis on the left side, intracranial atherosclerotic disease,  She is still at nursing home, with rehabilitation, she has made significant progress, she can ambulate with some assistance, but over the past few weeks, she complains of dizziness, especially when she get up using bathroom in the middle of the night or after prolonged sitting  Today's examination demonstrates orthostatic tachycardia, sitting down blood pressure 142/101, heart rate of 90, standing up 142/90, heart rate of 100; standing up for 1 minutes 135/97, heart rate of 105, she does complains of dizziness,  Laboratory evaluations in January 2021, decreased calcium 8.6, Albumin 2.4, anemia hemoglobin of 11.8,  UPDATE August 18 2019: She is accompanied by her daughter at today's clinical visit, She Continue Lives at [facility] nursing home, is [place], daughter re- describe her stroke episode in January 2021, she was found to slumped over, unresponsive for few minutes, followed by difficulty talking  I personally reviewed MRI of the brain, only punctuated right MCA territory stroke, less likely explaining personal onset of symptoms, MRI of the brain also showed left occipital infarction, above  presentation could indicate a partial seizure  She had loop  recorder implant in January 2021, no significant dysrhythmia found, still on Plavix as stroke prevention  She is overall has much improved, she can ambulate without assistant, still has unsteady gait, eager to go home,  But daughter reported she still has unsteady gait, significant hard of hearing, before hospital admission in January 2021, she mistakenly taking all of  her week worth of medication in 3 days, she could not explain why, today's Mini-Mental Status Examination is 4127 out of 30  Update September 18, 2019, With a reported episode of sudden onset confusion, slumped over in January 2021, history of previous left occipital cortical stroke by MRI, there was a possibility of partial seizure, I empirically treat her with DepakoteDR 500 mg twice a day in early June, couple weeks later, she was noted to have increased confusion, fatigue, speech difficulty by facility provider, Depakote was stopped, she had a gradual improvement, per daughter reviewing laboratory evaluation was taken, we will get the record  Update February 19, 2020 SS: Here today for follow-up accompanied by her daughter.  No further episodes of sudden onset confusion, slumping over, was questioning seizure?  Did not do well on Depakote, was discontinued in June/July.  Started on Abilify 5 mg daily around that time, then noted mild tremor in the foot.  Abilify was added for the mood, was agitated, angry.  Mood greatly improved.  Has noted more tremor, mostly at rest, more to the right arm and leg, but also to the left arm and leg.  Is intermittent, comes and goes today.  Abilify dose was reduced 2.5 mg in November, no change.  Question if drug-induced?  Also on Prozac. Report from facility NP, normal CBC, BMP, TSH, magnesium.  During this time, insurance no longer pays for PT, rarely getting out of bed or being walked, deconditioned.  On oxygen. Dr. Terrace ArabiaYan saw the patient with me today. Is at [facility].  REVIEW OF SYSTEMS: Out of a  complete 14 system review of symptoms, the patient complains only of the following symptoms, and all other reviewed systems are negative.  Tremor  ALLERGIES: Allergies  Allergen Reactions  . Amoxicillin Rash  . Clavulanic Acid Rash    HOME MEDICATIONS: Outpatient Medications Prior to Visit  Medication Sig Dispense Refill  . acetaminophen (TYLENOL) 325 MG tablet Take 650 mg by mouth every 8 (eight) hours as needed.    . ARIPiprazole (ABILIFY) 5 MG tablet Take 5 mg by mouth daily.    Marland Kitchen. atorvastatin (LIPITOR) 20 MG tablet Take 1 tablet (20 mg total) by mouth daily at 6 PM.    . clopidogrel (PLAVIX) 75 MG tablet Take 1 tablet (75 mg total) by mouth daily.    . ferrous gluconate (FERGON) 324 MG tablet Take 324 mg by mouth daily.    Marland Kitchen. FLUoxetine (PROZAC) 20 MG tablet Take 20 mg by mouth at bedtime.    Marland Kitchen. HYDROcodone-acetaminophen (NORCO/VICODIN) 5-325 MG tablet Take 1 tablet by mouth every 6 (six) hours as needed for moderate pain.    Marland Kitchen. levothyroxine (SYNTHROID) 112 MCG tablet Take 112 mcg by mouth daily before breakfast.     . linaclotide (LINZESS) 145 MCG CAPS capsule Take 145 mcg by mouth daily as needed (constipation).    . Magnesium 100 MG TABS Take 200 mg by mouth daily.    . magnesium hydroxide (MILK OF MAGNESIA) 400 MG/5ML suspension Take 30 mLs by mouth every three (3) days as  needed for mild constipation.    . metoprolol succinate (TOPROL XL) 25 MG 24 hr tablet Take 1 tablet (25 mg total) by mouth every morning. 30 tablet 11  . omeprazole (PRILOSEC) 20 MG capsule Take 20 mg by mouth daily.     . OXYGEN Inhale 2 L into the lungs at bedtime as needed.     . Vitamin D, Ergocalciferol, (DRISDOL) 1.25 MG (50000 UT) CAPS capsule Take 50,000 Units by mouth 2 (two) times a week. Tuesday and Friday     No facility-administered medications prior to visit.    PAST MEDICAL HISTORY: Past Medical History:  Diagnosis Date  . COPD (chronic obstructive pulmonary disease) (HCC)   . Depression  with anxiety   . Hypertension   . Hypothyroid   . Stroke [facility]) 2015    PAST SURGICAL HISTORY: Past Surgical History:  Procedure Laterality Date  . ABDOMINAL HYSTERECTOMY    . ANTERIOR CERVICAL DECOMP/DISCECTOMY FUSION N/A 03/18/2019   Procedure: ANTERIOR CERVICAL DECOMPRESSION/DISCECTOMY FUSION, CERVICAL FOUR- CERVICAL FIVE, CERVICAL FIVE- CERVICAL SIX, CERVICAL SIX- CERVICAL SEVEN;  Surgeon: Lisbeth Renshaw, MD;  Location: [facility] OR;  Service: Neurosurgery;  Laterality: N/A;  . CHOLECYSTECTOMY    . EYE SURGERY Right   . IR ANGIO INTRA EXTRACRAN SEL COM CAROTID INNOMINATE BILAT MOD SED  09/19/2016  . IR ANGIO VERTEBRAL SEL VERTEBRAL UNI L MOD SED  09/19/2016  . IR US GUIDE VASC ACCESS RIGHT  09/19/2016  . LOOP RECORDER INSERTION N/A 04/11/2019   Procedure: LOOP RECORDER INSERTION;  Surgeon: Hillis Range, MD;  Location: [facility] INVASIVE CV LAB;  Service: Cardiovascular;  Laterality: N/A;  . TUMOR REMOVAL      FAMILY HISTORY: Family History  Problem Relation Age of Onset  . Cancer Mother   . Cancer Father   . Colon cancer Neg Hx     SOCIAL HISTORY: Social History   Socioeconomic History  . Marital status: Divorced    Spouse name: Not on file  . Number of children: Not on file  . Years of education: Not on file  . Highest education level: Not on file  Occupational History  . Not on file  Tobacco Use  . Smoking status: Blackwell Every Day Smoker    Packs/day: 1.00    Types: Cigarettes  . Smokeless tobacco: Never Used  Vaping Use  . Vaping Use: Never used  Substance and Sexual Activity  . Alcohol use: No  . Drug use: No  . Sexual activity: Not on file  Other Topics Concern  . Not on file  Social History Narrative   Patient lives at home alone. Is a widow  has 3 children and a high school education.   Patient smokes 1 pack of cigarettes and drinks 1-2 cups of coffee.    Social Determinants of Health   Financial Resource Strain: Not on file  Food Insecurity: Not on file   Transportation Needs: Not on file  Physical Activity: Not on file  Stress: Not on file  Social Connections: Not on file  Intimate Partner Violence: Not on file   PHYSICAL EXAM  Vitals:   02/19/20 1534  BP: 126/84  Pulse: 78   There is no height or weight on file to calculate BMI.  Generalized: Well developed, in no acute distress, depressed looking elderly female Neurological examination  Mentation: Alert, oriented, very hard of hearing, follows exam commands, speech is clear Cranial nerve II-XII: Pupils were equal round reactive to light. Extraocular movements were full, visual field were  full on confrontational test. Facial sensation and strength were normal. Head turning and shoulder shrug  were normal and symmetric. Motor: Strength is intact all extremities, mild left handgrip weakness, lack of effort on exam, intermittent resting tremor most notable right hand, right leg, but present to the left side, but comes and goes Sensory: Sensory testing is intact to soft touch on all 4 extremities. No evidence of extinction is noted.  Coordination: Cerebellar testing reveals good finger-nose-finger and heel-to-shin bilaterally (1st time finger dose finger was performed slow second time quicker more effort) Gait and station: Has to push off from seated position, requires 2 assist to take a few steps, is unsteady Reflexes: Deep tendon reflexes are symmetric but increased at the knees  DIAGNOSTIC DATA (LABS, IMAGING, TESTING) - I reviewed patient records, labs, notes, testing and imaging myself where available.  Lab Results  Component Value Date   WBC 8.9 05/27/2019   HGB 10.5 (L) 05/27/2019   HCT 31.9 (L) 05/27/2019   MCV 94 05/27/2019   PLT 320 05/27/2019      Component Value Date/Time   NA 140 04/11/2019 0213   K 3.9 04/11/2019 0213   CL 105 04/11/2019 0213   CO2 28 04/11/2019 0213   GLUCOSE 100 (H) 04/11/2019 0213   BUN 21 04/11/2019 0213   CREATININE 0.88 04/11/2019 0213    CALCIUM 8.6 (L) 04/11/2019 0213   PROT 5.2 (L) 04/11/2019 0213   ALBUMIN 2.4 (L) 04/11/2019 0213   AST 20 04/11/2019 0213   ALT 9 04/11/2019 0213   ALKPHOS 72 04/11/2019 0213   BILITOT 0.8 04/11/2019 0213   GFRNONAA >60 04/11/2019 0213   GFRAA >60 04/11/2019 0213   Lab Results  Component Value Date   CHOL 99 04/10/2019   HDL 21 (L) 04/10/2019   LDLCALC 58 04/10/2019   TRIG 100 04/10/2019   CHOLHDL 4.7 04/10/2019   Lab Results  Component Value Date   HGBA1C 5.7 (H) 04/10/2019   Lab Results  Component Value Date   VITAMINB12 381 05/27/2019   Lab Results  Component Value Date   TSH 3.690 05/27/2019   ASSESSMENT AND PLAN 79 y.o. year old female  has a past medical history of COPD (chronic obstructive pulmonary disease) (HCC), Depression with anxiety, Hypertension, Hypothyroid, and Stroke (HCC) (2015). here with:  1.  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  Intermittent increased confusion spells -Previous MRI of the brain showed only showed punctuated small right MCA stroke, would not explain daughter reported episode that patient has transient loss of consciousness, slumped over before April 09, 2019 hospital admission, she did have a history of left occipital stroke in the past, she is at high risk for partial seizure no further spells (slumping over, confusion) -July 2021 EEG showed moderate slowing, no epileptiform discharge -On Depakote 500 mg twice a day in early June (1 to 2 weeks later, increased confusion, fatigue, lack of motivation, mild improvement since stopping medication)-Stopped due to side effect  -Abilify 5 mg was added in June/July dose reduced in Nov 2.5 mg due to tremor  3.  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  Tremor, mostly at rest -Most notable to  the right arm and right leg at rest but is intermittent, also on the left side -Unknown etiology?  Dose reduction of Abilify from 5 mg to 2.5 offered no change.  Labs have been normal (CBC, BMP, TSH, magnesium per facility) -Possibly anxiety related?  Deconditioning? No significant rigidity, or bradykinesia, she does lack some effort on exam -Will write for PT/OT, last several months, not getting up, ambulating, staying mostly in the bed -Follow tremor over time, follow-up in 6 months or sooner if needed  I spent 30 minutes of face-to-face and non-face-to-face time with patient.  This included previsit chart review, lab review, study review, order entry, electronic health record documentation, patient education.  Margie Ege, AGNP-C, DNP 02/19/2020, 4:19 PM [facility] [address] [place], [place] [address] [phone]

## 2020-02-26 NOTE — Progress Notes (Signed)
[facility] Summary Report / Loop Recorder 

## 2020-03-19 ENCOUNTER — Non-Acute Institutional Stay: Payer: Medicare Other | Admitting: Hospice

## 2020-03-19 ENCOUNTER — Other Ambulatory Visit: Payer: Self-pay

## 2020-03-19 DIAGNOSIS — Z515 Encounter for palliative care: Secondary | ICD-10-CM

## 2020-03-19 DIAGNOSIS — I63011 Cerebral infarction due to thrombosis of right vertebral artery: Secondary | ICD-10-CM

## 2020-03-19 NOTE — Progress Notes (Signed)
Therapist, nutritional Palliative Care Consult Note Telephone: [phone]  Fax: [phone]  PATIENT NAME: Maria Blackwell DOB: Nov 01, 1940 MRN: [id number]  PRIMARY CARE PROVIDER:   Marden Noble, MD Marden Noble, MD [address] [place],  [place] [address]  REFERRING PROVIDER: Marden Noble, MD Marden Noble, MD [address] [place],  [place] [address]  RESPONSIBLE PARTY:Daughter - Eusebio Friendly [phone]   RECOMMENDATIONS/PLAN: Visit consisted of building trust and discussions on Palliative Medicine as specialized medical care for people living with serious illness, aimed at facilitating better quality of life through symptoms relief, assisting with advance care plan and establishing goals of care. Advance Care Planning/Goals of Care: Patient is a DNR. DNR in facility chart. Goals of care: Goals of care include to maximize quality of life and symptom management. Palliative care will continue to provide support to patient, family, and the medical team. Symptom management: Shortness of breath: Shortness of breath related to COPD managed with oxygen supplementation; no recent COPD flare.  Patient is compliant with her medications.Patient isstand by to little assist in ADLs. She is in no distress, FLACC 0.  Dysphagia: related to CVA. Continue to sit patient upright 90 degrees for meals and remain upright 30-60 minutes after meals. Aspiration precaution and fall precautions in place; she continues on Magic cup, med pass for added caloric nutrition, reglar diet chopped meats texture, thin consistency liquid.  .Nursing with no concerns; patient doing well and compliant with her medications. Follow UM:[id number] care will continue to follow patient for goals of care clarification and symptom management. I spent8minutes providing this consultation; time includes time with patient/nursing, chart review and  documentation.More than 50% of the time in this consultation was spent on coordinating communication  HISTORY OF PRESENT ILLNESS:Maria J Sheltonis a 80 y.o.year oldfemalewith multiple medical problems including CVA with mild residual left sided paresis, cervical myelopathy, COPD.Palliative Care was asked to help address goals of care.  CODE STATUS:DNR  PPS:40%  HOSPICE ELIGIBILITY/DIAGNOSIS: TBD  PAST MEDICAL HISTORY:  Past Medical History:  Diagnosis Date  . COPD (chronic obstructive pulmonary disease) (HCC)   . Depression with anxiety   . Hypertension   . Hypothyroid   . Stroke [facility]) 2015    SOCIAL HX:  Social History   Tobacco Use  . Smoking status: Current Every Day Smoker    Packs/day: 1.00    Types: Cigarettes  . Smokeless tobacco: Never Used  Substance Use Topics  . Alcohol use: No    ALLERGIES:  Allergies  Allergen Reactions  . Amoxicillin Rash  . Clavulanic Acid Rash     PERTINENT MEDICATIONS:  Outpatient Encounter Medications as of 03/19/2020  Medication Sig  . acetaminophen (TYLENOL) 325 MG tablet Take 650 mg by mouth every 8 (eight) hours as needed.  . ARIPiprazole (ABILIFY) 5 MG tablet Take 5 mg by mouth daily.  Marland Kitchen atorvastatin (LIPITOR) 20 MG tablet Take 1 tablet (20 mg total) by mouth daily at 6 PM.  . clopidogrel (PLAVIX) 75 MG tablet Take 1 tablet (75 mg total) by mouth daily.  . ferrous gluconate (FERGON) 324 MG tablet Take 324 mg by mouth daily.  Marland Kitchen FLUoxetine (PROZAC) 20 MG tablet Take 20 mg by mouth at bedtime.  Marland Kitchen HYDROcodone-acetaminophen (NORCO/VICODIN) 5-325 MG tablet Take 1 tablet by mouth every 6 (six) hours as needed for moderate pain.  Marland Kitchen levothyroxine (SYNTHROID) 112 MCG tablet Take 112 mcg by mouth daily before breakfast.   . linaclotide (  LINZESS) 145 MCG CAPS capsule Take 145 mcg by mouth daily as needed (constipation).  . Magnesium 100 MG TABS Take 200 mg by mouth daily.  . magnesium hydroxide (MILK OF MAGNESIA) 400 MG/5ML  suspension Take 30 mLs by mouth every three (3) days as needed for mild constipation.  . metoprolol succinate (TOPROL XL) 25 MG 24 hr tablet Take 1 tablet (25 mg total) by mouth every morning.  Marland Kitchen omeprazole (PRILOSEC) 20 MG capsule Take 20 mg by mouth daily.   . OXYGEN Inhale 2 L into the lungs at bedtime as needed.   . Vitamin D, Ergocalciferol, (DRISDOL) 1.25 MG (50000 UT) CAPS capsule Take 50,000 Units by mouth 2 (two) times a week. Tuesday and Friday   No facility-administered encounter medications on file as of 03/19/2020.    PHYSICAL EXAM/ROS:  General: NAD, frail appearing, cooperative Cardiovascular: regular rate and rhythm denies chest pain Pulmonary: Oxygen supplementation, no adventitious lung sounds auscultation, normal respiratory effort Abdomen: soft, nontender, + bowel sounds; no report of constipation GU: no suprapubic tenderness Extremities: no edema Skin: no rashes to exposed skin Neurological: Weakness but otherwise nonfocal  Note:  Portions of this note were generated with Dragon dictation software. Dictation errors may occur despite attempts at proofreading.  Rosaura Carpenter, NP

## 2020-03-22 ENCOUNTER — Ambulatory Visit (INDEPENDENT_AMBULATORY_CARE_PROVIDER_SITE_OTHER): Payer: Medicare Other

## 2020-03-22 DIAGNOSIS — I6310 Cerebral infarction due to embolism of unspecified precerebral artery: Secondary | ICD-10-CM | POA: Diagnosis not present

## 2020-03-22 LAB — CUP PACEART REMOTE DEVICE CHECK
Date Time Interrogation Session: 20220108230400
Implantable Pulse Generator Implant Date: 20210129

## 2020-04-05 NOTE — Progress Notes (Signed)
[facility] Summary Report / Loop Recorder 

## 2020-04-24 LAB — CUP PACEART REMOTE DEVICE CHECK
Date Time Interrogation Session: 20220210230317
Implantable Pulse Generator Implant Date: 20210129

## 2020-04-26 ENCOUNTER — Ambulatory Visit (INDEPENDENT_AMBULATORY_CARE_PROVIDER_SITE_OTHER): Payer: Medicare Other

## 2020-04-26 DIAGNOSIS — I6310 Cerebral infarction due to embolism of unspecified precerebral artery: Secondary | ICD-10-CM

## 2020-05-03 NOTE — Progress Notes (Signed)
[facility] Summary Report / Loop Recorder 

## 2020-06-11 DEATH — deceased

## 2020-07-17 IMAGING — MR MR HEAD W/O CM
7 of 11 series · 25 of 48 positions shown · non-contrast
Comparison: CT angiography same day

CLINICAL DATA: History of stroke and brain aneurysm. Speech
disturbance.

EXAM:
MRI HEAD WITHOUT CONTRAST
TECHNIQUE: Multiplanar, multiecho pulse sequences of the brain and surrounding
structures were obtained without intravenous contrast.

[Series 2: DWI · axial · 3.0mm · 0.94mm/px · z∈[-71,+76]mm · 7 of 100 slices shown (1 of 2)]
[im 1/100]
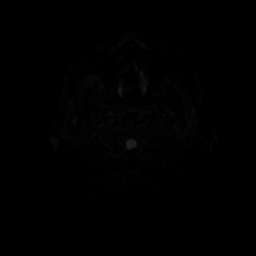
[im 17/100]
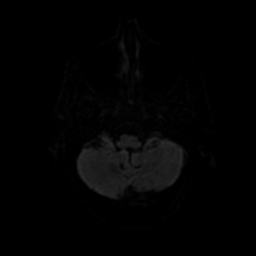
[im 34/100]
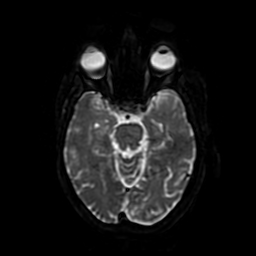
[im 50/100]
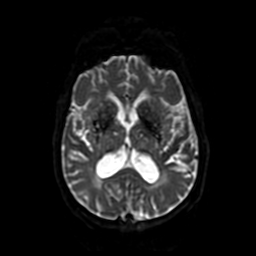
[im 67/100]
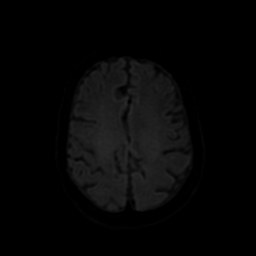
[im 83/100]
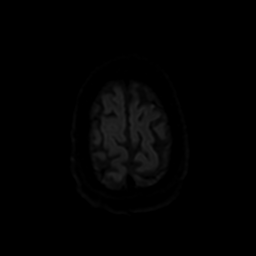
[im 100/100]
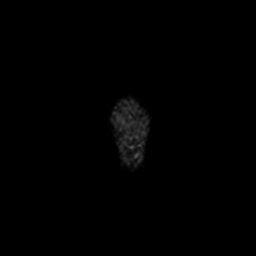

[Series 3: DWI · coronal · 4.0mm · 0.94mm/px · 6 of 74 slices shown (2 of 2)]
[im 1/74]
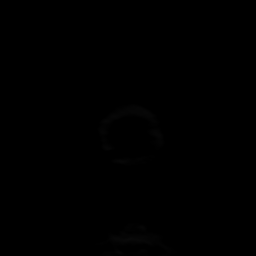
[im 15/74]
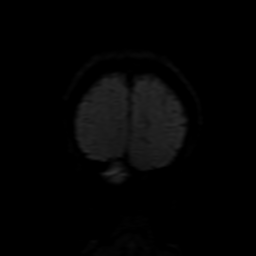
[im 30/74]
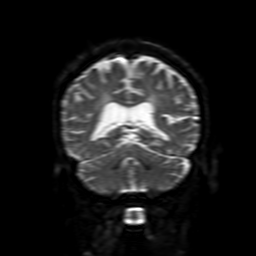
[im 44/74]
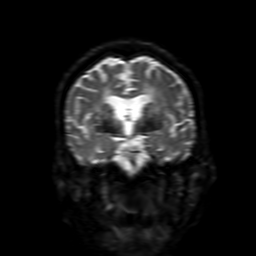
[im 59/74]
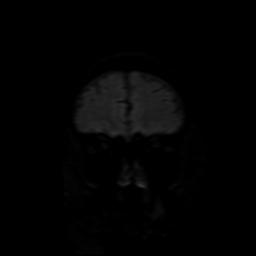
[im 74/74]
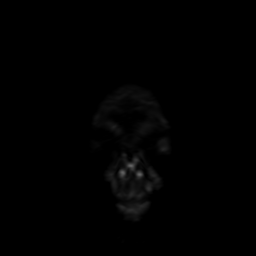

[Series 4: FLAIR · sagittal · 5.0mm · 0.23mm/px · 2 of 24 slices shown (1 of 2)]
[im 1/24]
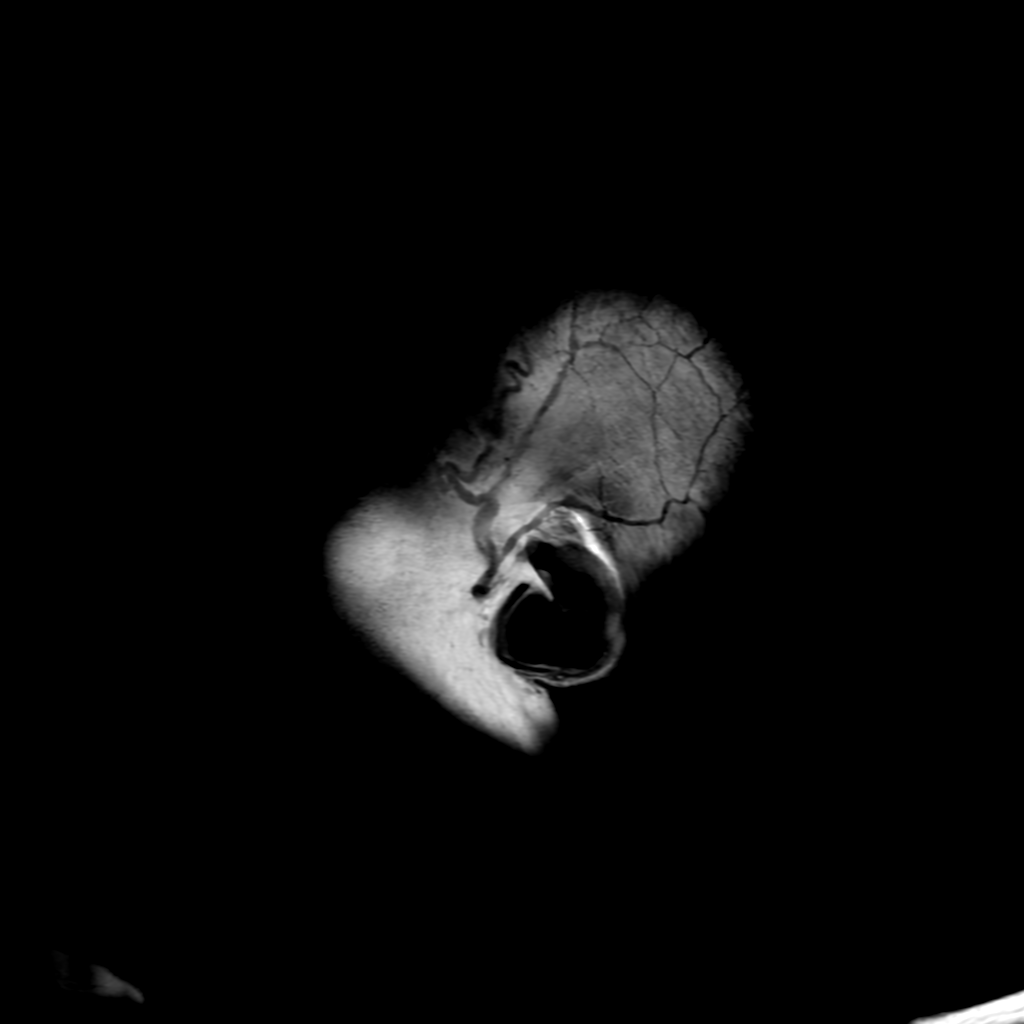
[im 24/24]
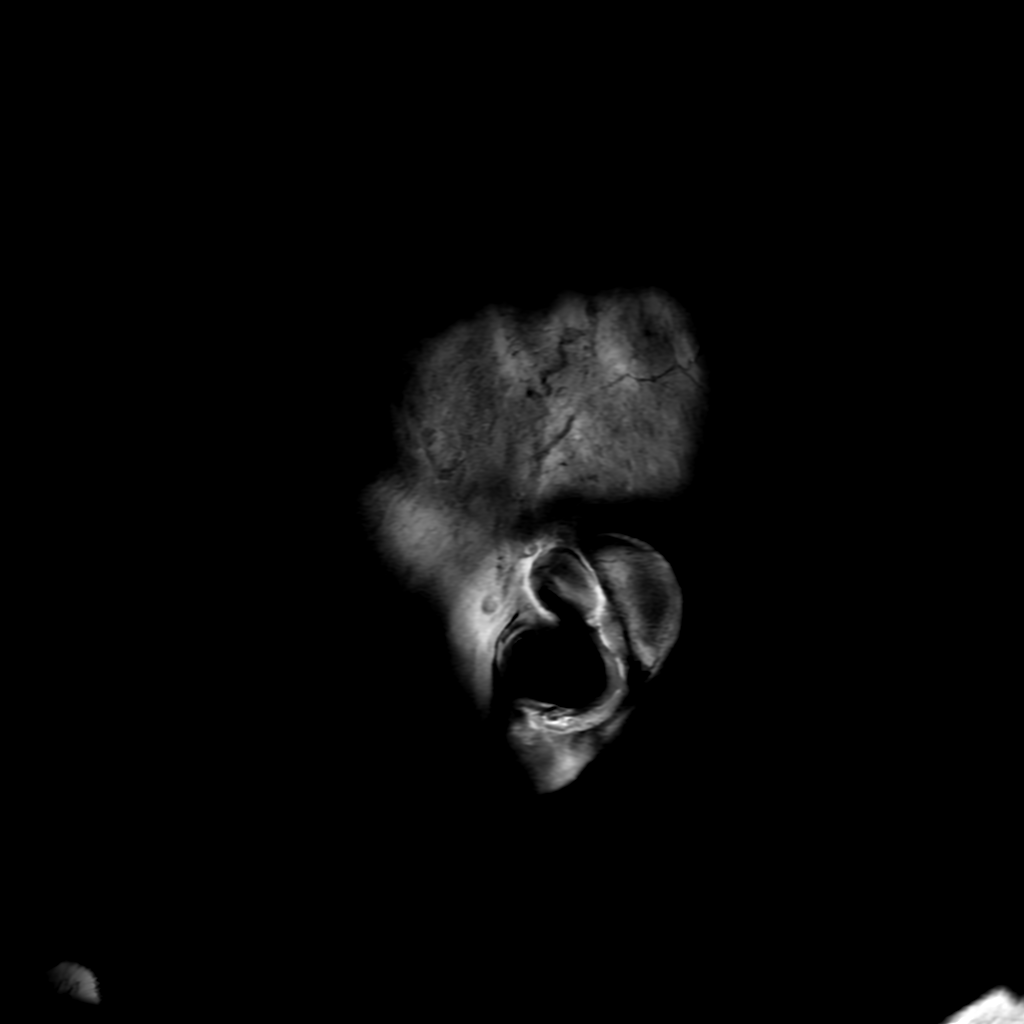

[Series 5: T2 · axial · 5.0mm · 0.23mm/px · 1 of 26 slices shown]
[im 1/26]
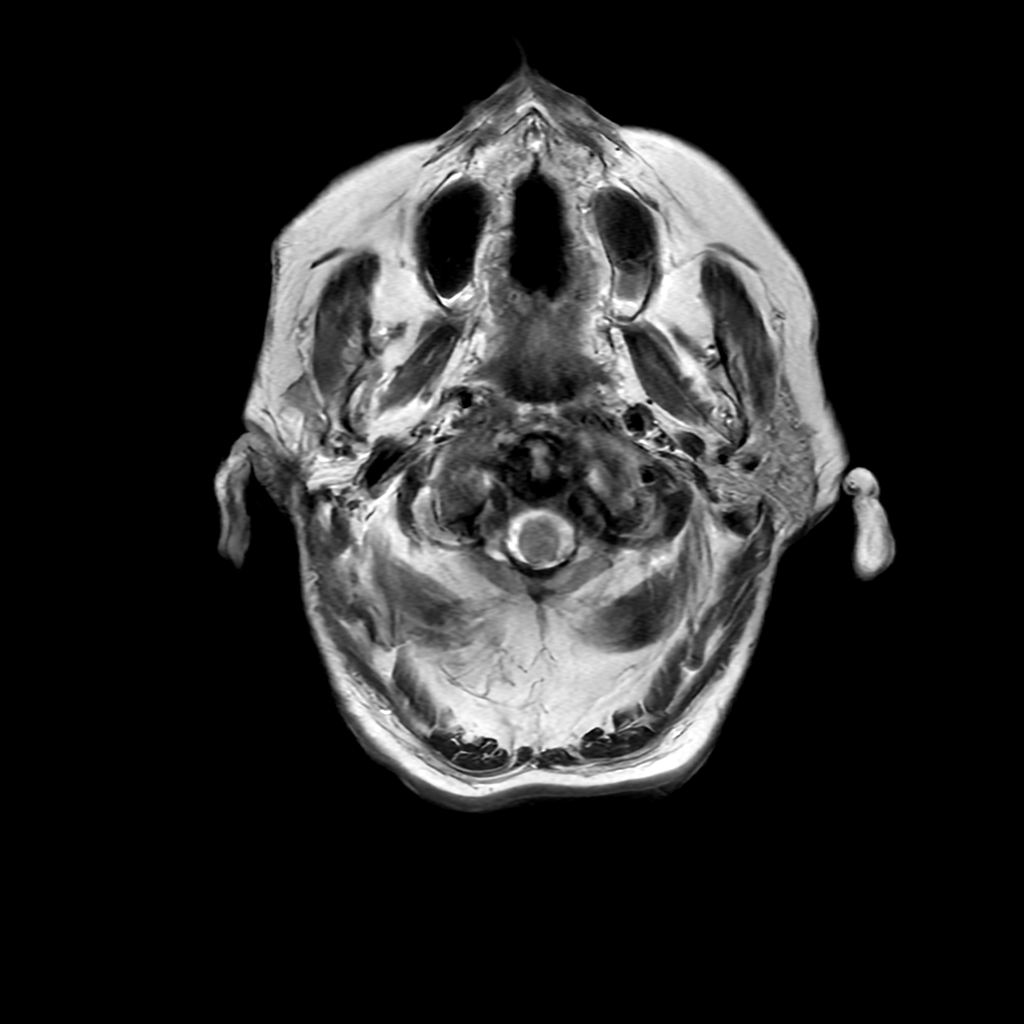

[Series 6: FLAIR · axial · 3.0mm · 0.41mm/px · z∈[-65,+78]mm · 2 of 25 slices shown (2 of 2)]
[im 1/25]
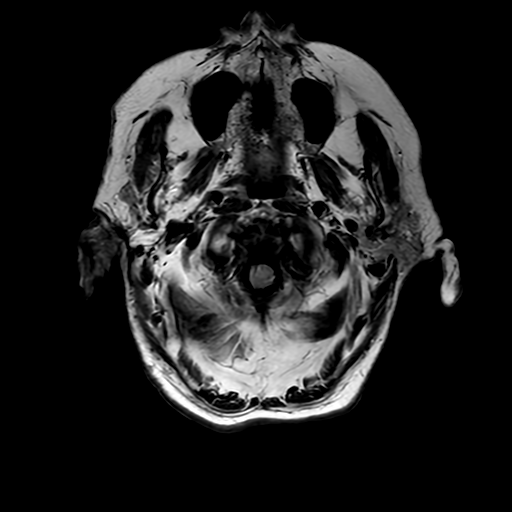
[im 25/25]
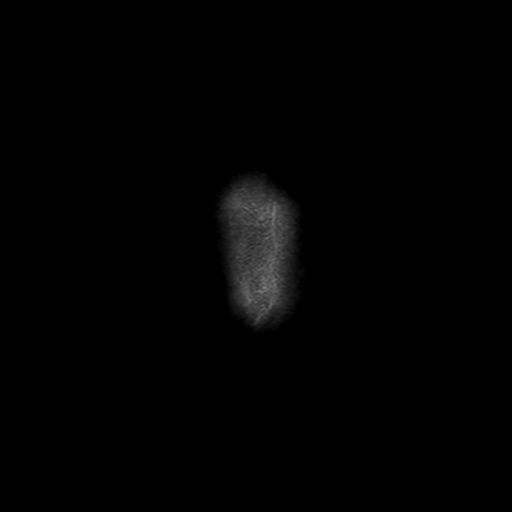

[Series 250: ADC · axial · 3.0mm · 0.94mm/px · z∈[-71,+76]mm · 4 of 48 slices shown (1 of 2)]
[im 1/48]
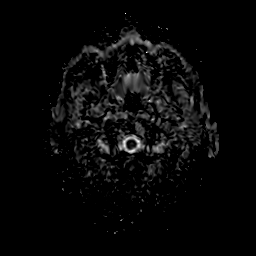
[im 16/48]
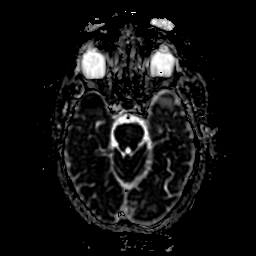
[im 32/48]
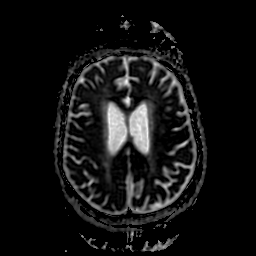
[im 48/48]
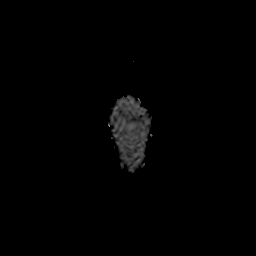

[Series 350: ADC · coronal · 4.0mm · 0.94mm/px · 3 of 37 slices shown (2 of 2)]
[im 1/37]
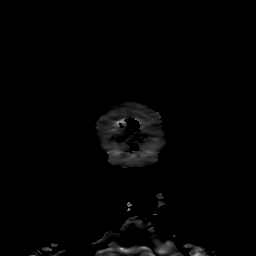
[im 19/37]
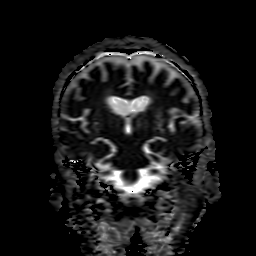
[im 37/37]
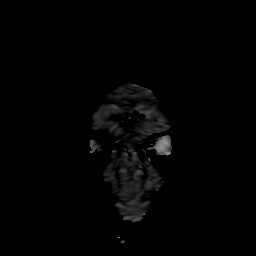

[25 of 48 positions shown; findings below may reference images not displayed]

FINDINGS: Brain: Diffusion imaging shows a punctate acute white matter
infarction in the right frontal subcortical white matter. Few small
foci of acute infarction affecting right parietal gyral surfaces.
Punctate acute infarction of the right parietal deep white matter.
This is consistent with micro embolic disease in the right MCA
territory. No large or confluent acute infarction. There are chronic
small-vessel ischemic changes of the pons and cerebellum. Old small
vessel infarctions of the thalami and throughout the cerebral
hemispheric white matter. There is an old left occipital cortical
infarction. No mass, acute hemorrhage, hydrocephalus or extra-axial
collection.

Vascular: Major vessels at the base of the brain show flow.

Skull and upper cervical spine: Negative

Sinuses/Orbits: Clear/normal

Other: None
IMPRESSION: Several punctate acute infarctions scattered in the right MCA
territory affecting the right frontal and parietal deep white matter
and right parietal gyral surfaces. No swelling or hemorrhage. No
large confluent acute infarction.

Extensive chronic small-vessel ischemic changes elsewhere throughout
the brain.

Old left occipital infarction.

## 2020-08-19 ENCOUNTER — Ambulatory Visit: Payer: Medicare Other | Admitting: Neurology
